# Patient Record
Sex: Female | Born: 1945
Health system: Southern US, Community
[De-identification: ages and names within clinical notes are randomized; demographics above are authoritative.]

## PROBLEM LIST (undated history)

## (undated) DIAGNOSIS — I10 Essential (primary) hypertension: Secondary | ICD-10-CM

## (undated) DIAGNOSIS — C801 Malignant (primary) neoplasm, unspecified: Secondary | ICD-10-CM

## (undated) DIAGNOSIS — E7800 Pure hypercholesterolemia, unspecified: Secondary | ICD-10-CM

## (undated) DIAGNOSIS — I493 Ventricular premature depolarization: Secondary | ICD-10-CM

## (undated) HISTORY — DX: Essential (primary) hypertension: I10

## (undated) HISTORY — PX: BUNIONECTOMY: SHX129

## (undated) HISTORY — DX: Ventricular premature depolarization: I49.3

## (undated) HISTORY — PX: BREAST ENHANCEMENT SURGERY: SHX7

## (undated) HISTORY — PX: EYE SURGERY: SHX253

## (undated) HISTORY — PX: TUBAL LIGATION: SHX77

## (undated) HISTORY — DX: Pure hypercholesterolemia, unspecified: E78.00

---

## 1969-03-31 HISTORY — PX: CERVICAL CONE BIOPSY: SUR198

## 1986-03-31 HISTORY — PX: AUGMENTATION MAMMAPLASTY: SUR837

## 2011-02-13 ENCOUNTER — Ambulatory Visit: Payer: Self-pay | Admitting: Internal Medicine

## 2011-08-14 DIAGNOSIS — L639 Alopecia areata, unspecified: Secondary | ICD-10-CM | POA: Diagnosis not present

## 2011-08-14 DIAGNOSIS — L219 Seborrheic dermatitis, unspecified: Secondary | ICD-10-CM | POA: Diagnosis not present

## 2011-08-19 DIAGNOSIS — L659 Nonscarring hair loss, unspecified: Secondary | ICD-10-CM | POA: Diagnosis not present

## 2011-08-19 DIAGNOSIS — F17200 Nicotine dependence, unspecified, uncomplicated: Secondary | ICD-10-CM | POA: Diagnosis not present

## 2011-08-19 DIAGNOSIS — Z86010 Personal history of colonic polyps: Secondary | ICD-10-CM | POA: Diagnosis not present

## 2011-08-19 DIAGNOSIS — E785 Hyperlipidemia, unspecified: Secondary | ICD-10-CM | POA: Diagnosis not present

## 2011-08-26 ENCOUNTER — Ambulatory Visit: Payer: Self-pay | Admitting: Internal Medicine

## 2011-08-26 DIAGNOSIS — Z978 Presence of other specified devices: Secondary | ICD-10-CM | POA: Diagnosis not present

## 2011-08-26 DIAGNOSIS — Z1231 Encounter for screening mammogram for malignant neoplasm of breast: Secondary | ICD-10-CM | POA: Diagnosis not present

## 2011-09-06 LAB — HM PAP SMEAR: HM Pap smear: NORMAL

## 2011-09-15 DIAGNOSIS — L639 Alopecia areata, unspecified: Secondary | ICD-10-CM | POA: Diagnosis not present

## 2011-09-16 DIAGNOSIS — Z0000 Encounter for general adult medical examination without abnormal findings: Secondary | ICD-10-CM | POA: Diagnosis not present

## 2011-09-16 DIAGNOSIS — E785 Hyperlipidemia, unspecified: Secondary | ICD-10-CM | POA: Diagnosis not present

## 2011-09-16 DIAGNOSIS — F17200 Nicotine dependence, unspecified, uncomplicated: Secondary | ICD-10-CM | POA: Diagnosis not present

## 2011-09-16 DIAGNOSIS — R87619 Unspecified abnormal cytological findings in specimens from cervix uteri: Secondary | ICD-10-CM | POA: Diagnosis not present

## 2011-09-16 DIAGNOSIS — Z79899 Other long term (current) drug therapy: Secondary | ICD-10-CM | POA: Diagnosis not present

## 2011-11-12 DIAGNOSIS — L639 Alopecia areata, unspecified: Secondary | ICD-10-CM | POA: Diagnosis not present

## 2012-01-23 DIAGNOSIS — Z23 Encounter for immunization: Secondary | ICD-10-CM | POA: Diagnosis not present

## 2012-02-12 DIAGNOSIS — L639 Alopecia areata, unspecified: Secondary | ICD-10-CM | POA: Diagnosis not present

## 2012-03-12 DIAGNOSIS — L639 Alopecia areata, unspecified: Secondary | ICD-10-CM | POA: Diagnosis not present

## 2012-06-03 DIAGNOSIS — M533 Sacrococcygeal disorders, not elsewhere classified: Secondary | ICD-10-CM | POA: Diagnosis not present

## 2012-08-09 DIAGNOSIS — J3489 Other specified disorders of nose and nasal sinuses: Secondary | ICD-10-CM | POA: Diagnosis not present

## 2012-08-09 DIAGNOSIS — R42 Dizziness and giddiness: Secondary | ICD-10-CM | POA: Diagnosis not present

## 2012-09-29 ENCOUNTER — Ambulatory Visit: Payer: Self-pay | Admitting: Internal Medicine

## 2012-09-29 DIAGNOSIS — Z1231 Encounter for screening mammogram for malignant neoplasm of breast: Secondary | ICD-10-CM | POA: Diagnosis not present

## 2012-10-04 DIAGNOSIS — H01009 Unspecified blepharitis unspecified eye, unspecified eyelid: Secondary | ICD-10-CM | POA: Diagnosis not present

## 2012-10-05 DIAGNOSIS — E785 Hyperlipidemia, unspecified: Secondary | ICD-10-CM | POA: Diagnosis not present

## 2012-10-05 DIAGNOSIS — Z86010 Personal history of colonic polyps: Secondary | ICD-10-CM | POA: Diagnosis not present

## 2012-10-05 DIAGNOSIS — Z79899 Other long term (current) drug therapy: Secondary | ICD-10-CM | POA: Diagnosis not present

## 2012-10-05 DIAGNOSIS — Z0000 Encounter for general adult medical examination without abnormal findings: Secondary | ICD-10-CM | POA: Diagnosis not present

## 2012-10-18 DIAGNOSIS — Z86010 Personal history of colonic polyps: Secondary | ICD-10-CM | POA: Diagnosis not present

## 2012-10-21 DIAGNOSIS — L259 Unspecified contact dermatitis, unspecified cause: Secondary | ICD-10-CM | POA: Diagnosis not present

## 2012-12-02 ENCOUNTER — Ambulatory Visit: Payer: Self-pay | Admitting: Gastroenterology

## 2012-12-02 DIAGNOSIS — Z860100 Personal history of colon polyps, unspecified: Secondary | ICD-10-CM | POA: Diagnosis not present

## 2012-12-02 DIAGNOSIS — E785 Hyperlipidemia, unspecified: Secondary | ICD-10-CM | POA: Diagnosis not present

## 2012-12-02 DIAGNOSIS — K648 Other hemorrhoids: Secondary | ICD-10-CM | POA: Diagnosis not present

## 2012-12-02 DIAGNOSIS — D126 Benign neoplasm of colon, unspecified: Secondary | ICD-10-CM | POA: Diagnosis not present

## 2012-12-02 DIAGNOSIS — Z801 Family history of malignant neoplasm of trachea, bronchus and lung: Secondary | ICD-10-CM | POA: Diagnosis not present

## 2012-12-02 DIAGNOSIS — M810 Age-related osteoporosis without current pathological fracture: Secondary | ICD-10-CM | POA: Diagnosis not present

## 2012-12-02 DIAGNOSIS — F17200 Nicotine dependence, unspecified, uncomplicated: Secondary | ICD-10-CM | POA: Diagnosis not present

## 2012-12-02 DIAGNOSIS — Z79899 Other long term (current) drug therapy: Secondary | ICD-10-CM | POA: Diagnosis not present

## 2012-12-02 LAB — HM COLONOSCOPY

## 2012-12-03 LAB — PATHOLOGY REPORT

## 2012-12-30 DIAGNOSIS — Z23 Encounter for immunization: Secondary | ICD-10-CM | POA: Diagnosis not present

## 2013-01-05 DIAGNOSIS — L819 Disorder of pigmentation, unspecified: Secondary | ICD-10-CM | POA: Diagnosis not present

## 2013-01-05 DIAGNOSIS — L639 Alopecia areata, unspecified: Secondary | ICD-10-CM | POA: Diagnosis not present

## 2013-01-05 DIAGNOSIS — M6580 Other synovitis and tenosynovitis, unspecified site: Secondary | ICD-10-CM | POA: Diagnosis not present

## 2013-04-19 DIAGNOSIS — H905 Unspecified sensorineural hearing loss: Secondary | ICD-10-CM | POA: Diagnosis not present

## 2013-04-19 DIAGNOSIS — H9319 Tinnitus, unspecified ear: Secondary | ICD-10-CM | POA: Diagnosis not present

## 2013-05-17 ENCOUNTER — Ambulatory Visit (INDEPENDENT_AMBULATORY_CARE_PROVIDER_SITE_OTHER): Payer: Medicare Other | Admitting: Podiatry

## 2013-05-17 ENCOUNTER — Ambulatory Visit (INDEPENDENT_AMBULATORY_CARE_PROVIDER_SITE_OTHER): Payer: Medicare Other

## 2013-05-17 ENCOUNTER — Encounter: Payer: Self-pay | Admitting: Podiatry

## 2013-05-17 VITALS — BP 117/71 | HR 67 | Resp 16 | Ht 66.5 in | Wt 168.0 lb

## 2013-05-17 DIAGNOSIS — M79609 Pain in unspecified limb: Secondary | ICD-10-CM

## 2013-05-17 DIAGNOSIS — M216X9 Other acquired deformities of unspecified foot: Secondary | ICD-10-CM | POA: Diagnosis not present

## 2013-05-17 DIAGNOSIS — L84 Corns and callosities: Secondary | ICD-10-CM | POA: Diagnosis not present

## 2013-05-17 DIAGNOSIS — M79672 Pain in left foot: Secondary | ICD-10-CM

## 2013-05-17 NOTE — Progress Notes (Signed)
Subjective:     Patient ID: Melinda Perry, female   DOB: 1946-02-25, 68 y.o.   MRN: [id number]  HPI patient presents stating I have had painful calluses on my left foot over my right foot that I have been trying to take care of myself with trimming and with shoe modifications. They do become bothersome I'm going to posterior [place] and wanted to see if there is anything else that could be done   Review of Systems  All other systems reviewed and are negative.       Objective:   Physical Exam  Nursing note and vitals reviewed. Constitutional: She is oriented to person, place, and time.  Cardiovascular: Intact distal pulses.   Musculoskeletal: Normal range of motion.  Neurological: She is oriented to person, place, and time.  Skin: Skin is warm.   neurovascular status intact no health history changes noted at this time with muscle strength adequate mild equinus and some diminishment of the plantar fat pad on the metatarsals of both feet. Keratotic lesion sub-1-5 left sub-5 right     Assessment:     Plantarflexed metatarsals with history of foot surgery and diminished fat pad noted left over right foot    Plan:     H&P and x-rays reviewed. Today debridement accomplished with discussion of customized orthotics if symptoms were to persist reappoint if this does not solve her problem

## 2013-05-17 NOTE — Progress Notes (Signed)
   Subjective:    Patient ID: Melinda Perry, female    DOB: 1945-12-10, 68 y.o.   MRN: [id number]  HPI Pt states to have the calluses on the left foot looked at , have had it for several years , left foot 1st met and 5th met head painful. Pt states that she trims them herself    Review of Systems  All other systems reviewed and are negative.       Objective:   Physical Exam        Assessment & Plan:

## 2013-10-10 DIAGNOSIS — R030 Elevated blood-pressure reading, without diagnosis of hypertension: Secondary | ICD-10-CM | POA: Diagnosis not present

## 2013-10-29 LAB — HM MAMMOGRAPHY: HM Mammogram: NORMAL

## 2013-11-01 ENCOUNTER — Ambulatory Visit: Payer: Self-pay | Admitting: Internal Medicine

## 2013-11-01 DIAGNOSIS — Z1231 Encounter for screening mammogram for malignant neoplasm of breast: Secondary | ICD-10-CM | POA: Diagnosis not present

## 2013-11-16 DIAGNOSIS — E785 Hyperlipidemia, unspecified: Secondary | ICD-10-CM | POA: Diagnosis not present

## 2013-11-16 DIAGNOSIS — Z79899 Other long term (current) drug therapy: Secondary | ICD-10-CM | POA: Diagnosis not present

## 2013-11-16 DIAGNOSIS — Z23 Encounter for immunization: Secondary | ICD-10-CM | POA: Diagnosis not present

## 2013-11-16 DIAGNOSIS — Z0000 Encounter for general adult medical examination without abnormal findings: Secondary | ICD-10-CM | POA: Diagnosis not present

## 2013-11-16 DIAGNOSIS — R030 Elevated blood-pressure reading, without diagnosis of hypertension: Secondary | ICD-10-CM | POA: Diagnosis not present

## 2013-12-28 DIAGNOSIS — R3129 Other microscopic hematuria: Secondary | ICD-10-CM | POA: Diagnosis not present

## 2013-12-28 DIAGNOSIS — I10 Essential (primary) hypertension: Secondary | ICD-10-CM | POA: Diagnosis not present

## 2013-12-28 DIAGNOSIS — E785 Hyperlipidemia, unspecified: Secondary | ICD-10-CM | POA: Diagnosis not present

## 2013-12-28 DIAGNOSIS — Z23 Encounter for immunization: Secondary | ICD-10-CM | POA: Diagnosis not present

## 2013-12-28 LAB — BASIC METABOLIC PANEL
BUN: 17 mg/dL (ref 4–21)
Creatinine: 0.8 mg/dL (ref 0.5–1.1)

## 2013-12-28 LAB — TSH: TSH: 2.4 u[IU]/mL (ref 0.41–5.90)

## 2013-12-28 LAB — LIPID PANEL
CHOLESTEROL: 205 mg/dL — AB (ref 0–200)
HDL: 48 mg/dL (ref 35–70)
LDL CALC: 138 mg/dL
Triglycerides: 97 mg/dL (ref 40–160)

## 2013-12-28 LAB — CBC AND DIFFERENTIAL: Hemoglobin: 15.9 g/dL (ref 12.0–16.0)

## 2014-02-08 ENCOUNTER — Ambulatory Visit: Payer: Self-pay | Admitting: Physician Assistant

## 2014-02-08 DIAGNOSIS — W2189XA Striking against or struck by other sports equipment, initial encounter: Secondary | ICD-10-CM | POA: Diagnosis not present

## 2014-02-08 DIAGNOSIS — I10 Essential (primary) hypertension: Secondary | ICD-10-CM | POA: Diagnosis not present

## 2014-02-08 DIAGNOSIS — S61211A Laceration without foreign body of left index finger without damage to nail, initial encounter: Secondary | ICD-10-CM | POA: Diagnosis not present

## 2014-02-18 ENCOUNTER — Ambulatory Visit: Payer: Self-pay | Admitting: Physician Assistant

## 2014-02-18 DIAGNOSIS — Z043 Encounter for examination and observation following other accident: Secondary | ICD-10-CM | POA: Diagnosis not present

## 2014-02-18 DIAGNOSIS — I10 Essential (primary) hypertension: Secondary | ICD-10-CM | POA: Diagnosis not present

## 2014-05-04 DIAGNOSIS — H11153 Pinguecula, bilateral: Secondary | ICD-10-CM | POA: Diagnosis not present

## 2014-05-25 DIAGNOSIS — Z961 Presence of intraocular lens: Secondary | ICD-10-CM | POA: Diagnosis not present

## 2014-06-12 DIAGNOSIS — L638 Other alopecia areata: Secondary | ICD-10-CM | POA: Diagnosis not present

## 2014-06-12 DIAGNOSIS — D229 Melanocytic nevi, unspecified: Secondary | ICD-10-CM | POA: Diagnosis not present

## 2014-11-27 DIAGNOSIS — B351 Tinea unguium: Secondary | ICD-10-CM | POA: Diagnosis not present

## 2014-11-30 DIAGNOSIS — L638 Other alopecia areata: Secondary | ICD-10-CM | POA: Diagnosis not present

## 2014-12-27 ENCOUNTER — Other Ambulatory Visit: Payer: Self-pay | Admitting: Internal Medicine

## 2014-12-29 DIAGNOSIS — Z23 Encounter for immunization: Secondary | ICD-10-CM | POA: Diagnosis not present

## 2014-12-31 IMAGING — MG MM DIGITAL SCREENING BILAT W/ CAD
1 series · 8 of 8 positions shown · non-contrast
Comparison: none

REASON FOR EXAM: SCR MAMMO NO ORDER
COMMENTS:

[R CC · right · 8 of 10 slices shown]
[im 1/10]
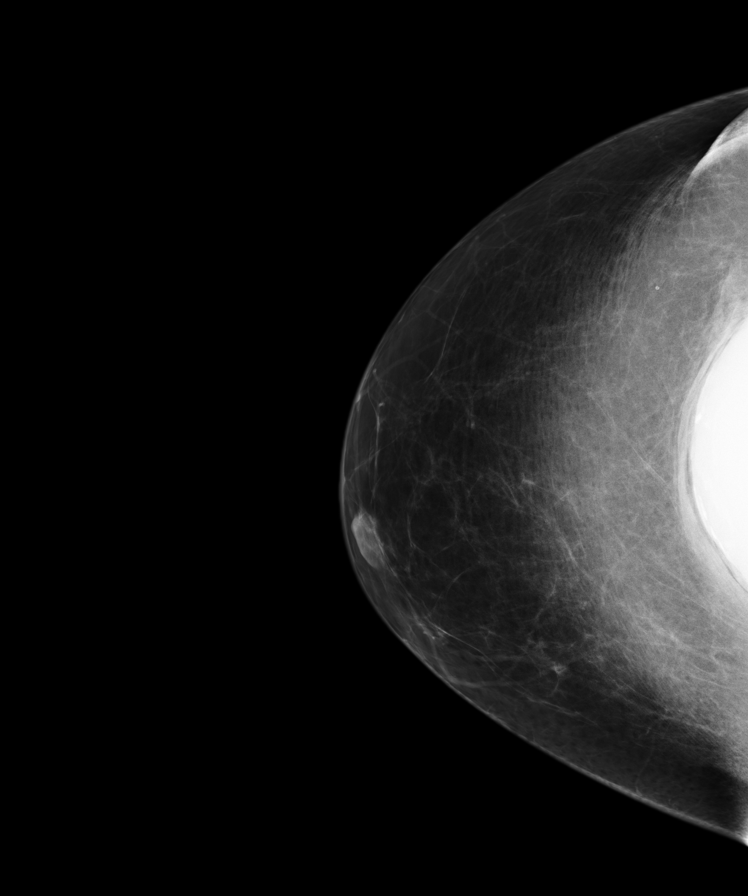
[im 2/10]
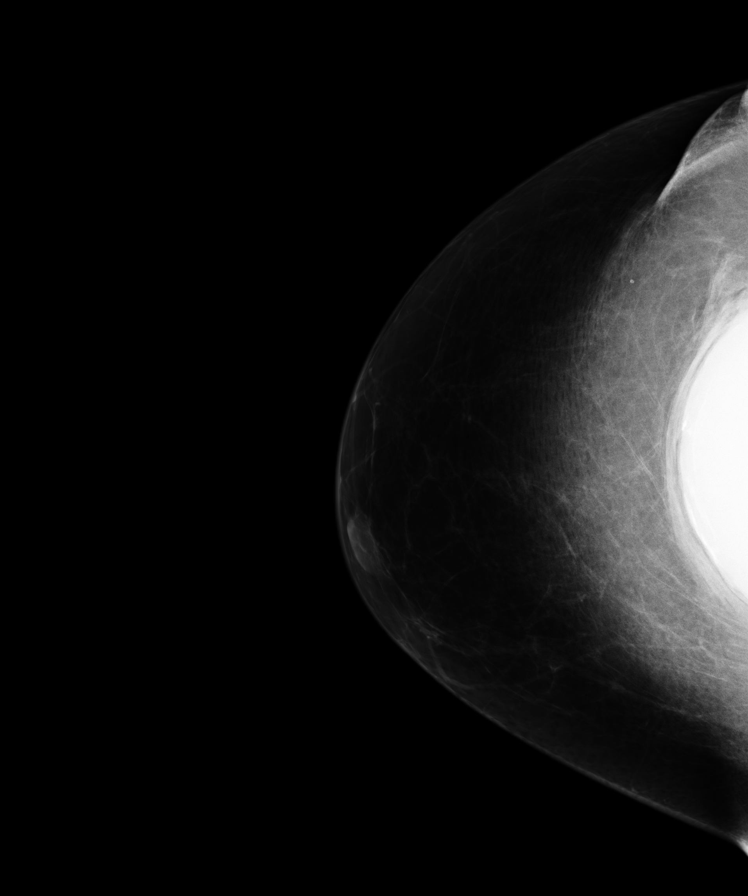
[im 3/10]
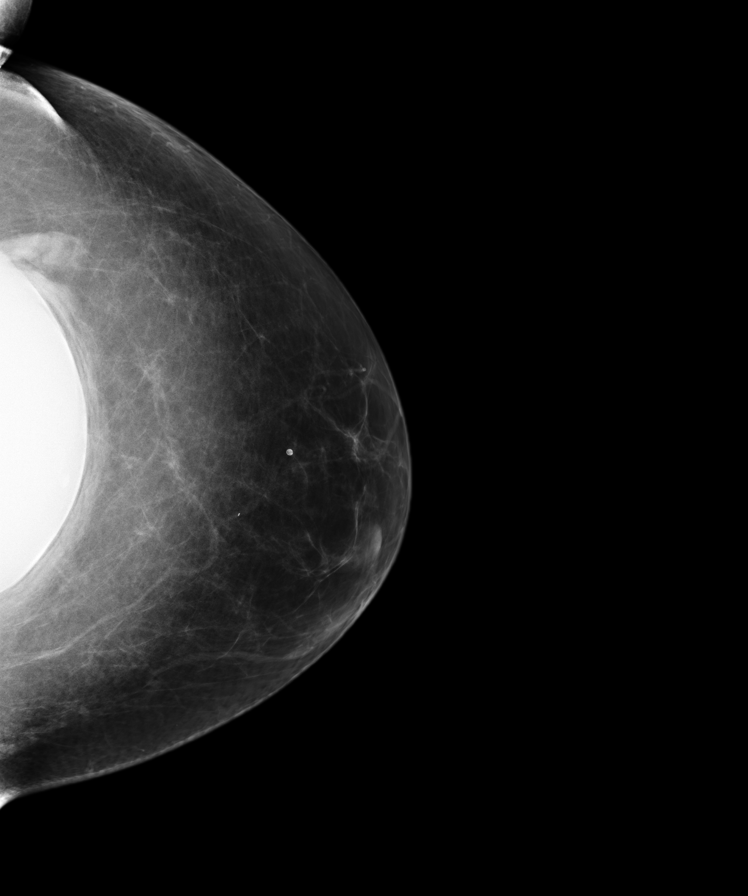
[im 4/10]
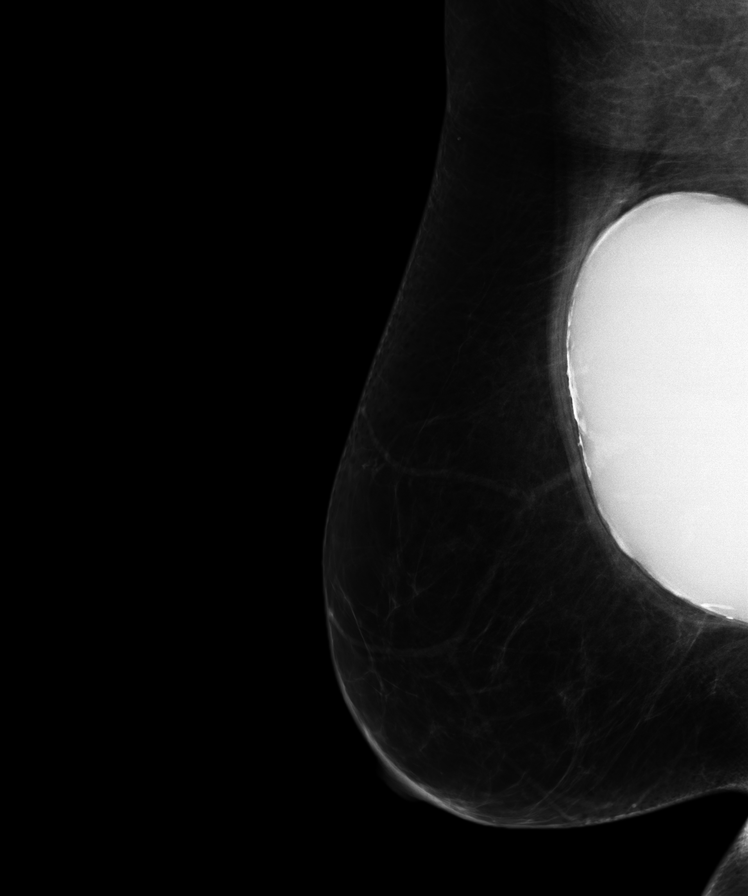
[im 6/10]
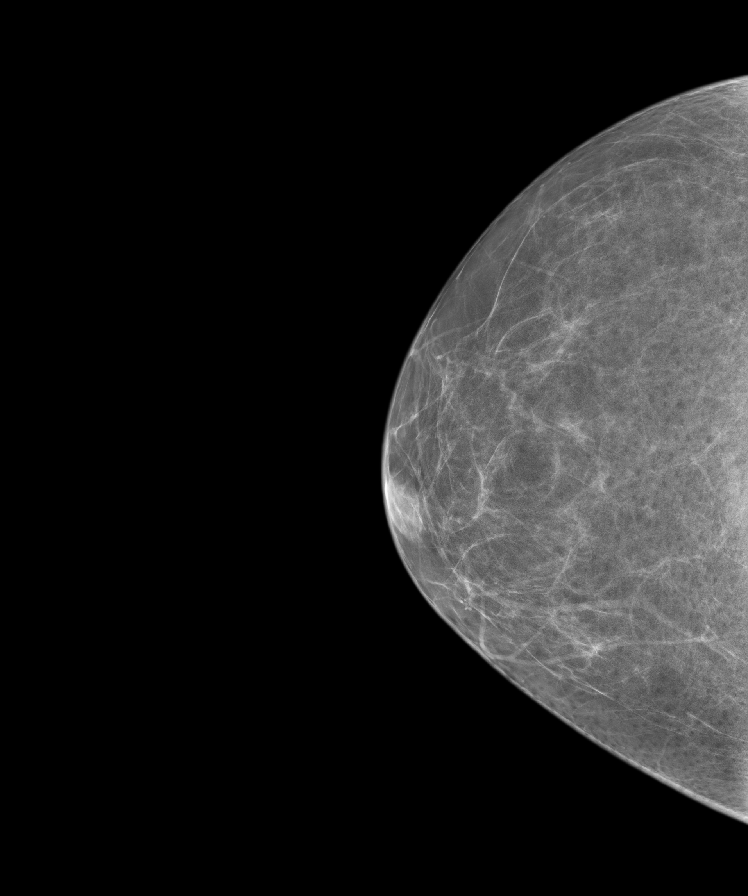
[im 7/10]
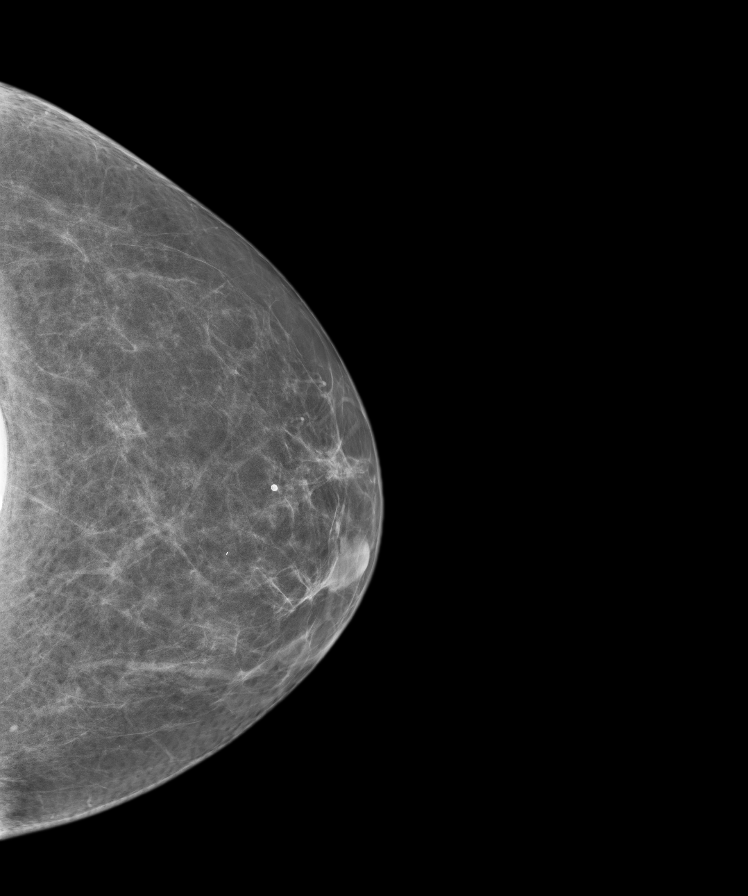
[im 8/10]
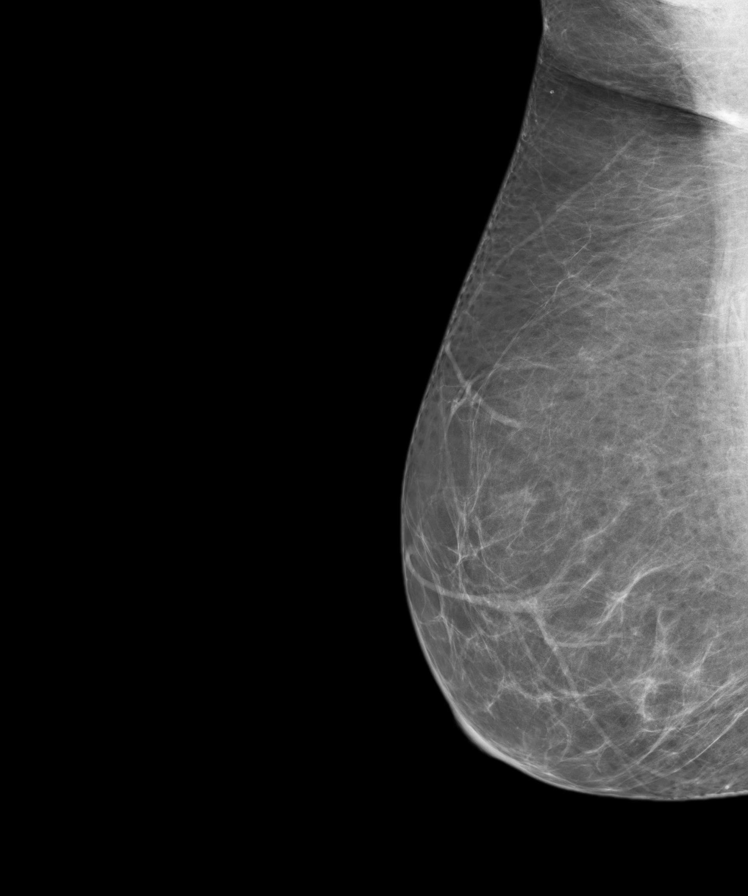
[im 10/10]
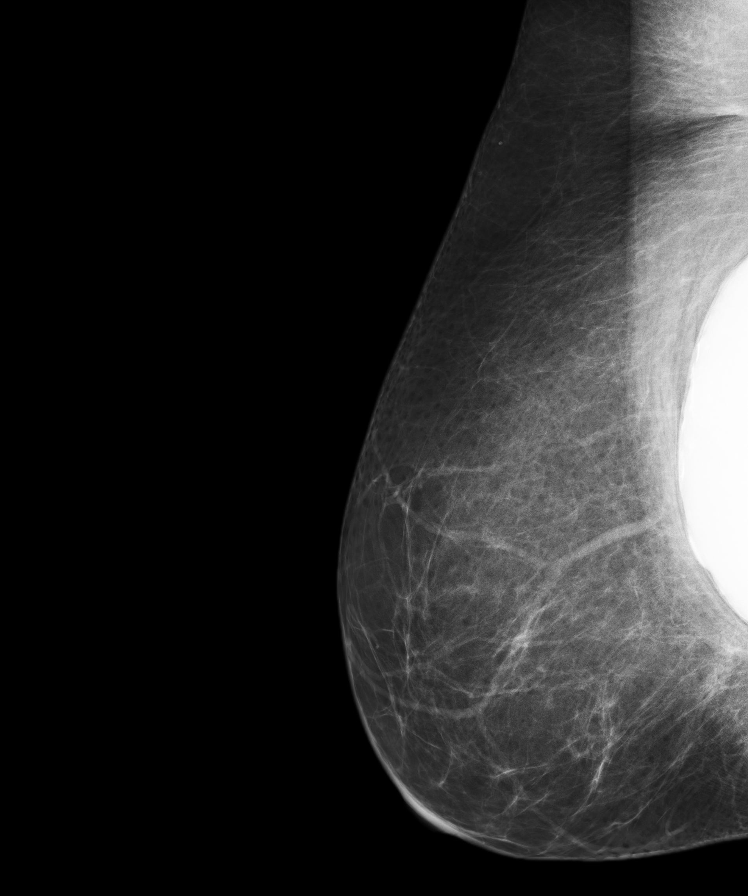

[8 of 8 positions shown; findings below may reference images not displayed]

PROCEDURE:     MMM - MMM DGT SCR NO ORDER W/CAD  - September 29, 2012  [DATE]

RESULT:     Standard and pushback views were obtained through both breasts
to allow imaging of the native parenchyma as well as the breast implants.
Comparison is made to previous digital studies August 26, 2011,June 11, 2010, and June 02, 2007.

The breast implants appear smoothly marginated. There are small amounts of
capsular calcification demonstrated predominantly on the right. The native
breast parenchyma is largely fatty replaced. There is no dominant mass and
there are no malignant appearing groupings of microcalcification.
IMPRESSION: There are no findings suspicious for malignancy nor for
implant rupture.

BI-RADS 2: Benign findings.

Recommendation: please continue to encourage yearly mammographic followup.

BREAST COMPOSITION: The breast composition is ALMOST ENTIRELY FATTY
(glandular tissue is less than 25%)

A NEGATIVE MAMMOGRAM REPORT DOES NOT PRECLUDE BIOPSY OR OTHER EVALUATION OF
A CLINICALLY PALPABLE OR OTHERWISE SUSPICIOUS MASS OR LESION. BREAST CANCER
MAY NOT BE DETECTED BY MAMMOGRAPHY IN UP TO 10% OF CASES.

[REDACTED]

## 2015-01-02 DIAGNOSIS — L638 Other alopecia areata: Secondary | ICD-10-CM | POA: Diagnosis not present

## 2015-03-09 ENCOUNTER — Other Ambulatory Visit: Payer: Self-pay | Admitting: Internal Medicine

## 2015-03-09 ENCOUNTER — Encounter: Payer: Self-pay | Admitting: Internal Medicine

## 2015-03-09 DIAGNOSIS — L659 Nonscarring hair loss, unspecified: Secondary | ICD-10-CM | POA: Insufficient documentation

## 2015-03-09 DIAGNOSIS — R87619 Unspecified abnormal cytological findings in specimens from cervix uteri: Secondary | ICD-10-CM | POA: Insufficient documentation

## 2015-03-09 DIAGNOSIS — F17201 Nicotine dependence, unspecified, in remission: Secondary | ICD-10-CM | POA: Insufficient documentation

## 2015-03-09 DIAGNOSIS — E782 Mixed hyperlipidemia: Secondary | ICD-10-CM | POA: Insufficient documentation

## 2015-03-09 DIAGNOSIS — R3129 Other microscopic hematuria: Secondary | ICD-10-CM | POA: Insufficient documentation

## 2015-03-09 DIAGNOSIS — Z86010 Personal history of colonic polyps: Secondary | ICD-10-CM | POA: Insufficient documentation

## 2015-03-09 DIAGNOSIS — Z8781 Personal history of (healed) traumatic fracture: Secondary | ICD-10-CM | POA: Insufficient documentation

## 2015-03-09 DIAGNOSIS — I10 Essential (primary) hypertension: Secondary | ICD-10-CM | POA: Insufficient documentation

## 2015-04-11 ENCOUNTER — Encounter: Payer: Self-pay | Admitting: Internal Medicine

## 2015-04-11 ENCOUNTER — Ambulatory Visit
Admission: RE | Admit: 2015-04-11 | Discharge: 2015-04-11 | Disposition: A | Discharge status: Home / Self Care | Payer: Medicare Other | Source: Ambulatory Visit | Attending: Internal Medicine | Admitting: Internal Medicine

## 2015-04-11 ENCOUNTER — Ambulatory Visit (INDEPENDENT_AMBULATORY_CARE_PROVIDER_SITE_OTHER): Payer: Medicare Other | Admitting: Internal Medicine

## 2015-04-11 ENCOUNTER — Other Ambulatory Visit: Payer: Self-pay | Admitting: Internal Medicine

## 2015-04-11 VITALS — BP 108/70 | HR 68 | Ht 66.0 in | Wt 176.2 lb

## 2015-04-11 DIAGNOSIS — M25579 Pain in unspecified ankle and joints of unspecified foot: Secondary | ICD-10-CM | POA: Insufficient documentation

## 2015-04-11 DIAGNOSIS — F17200 Nicotine dependence, unspecified, uncomplicated: Secondary | ICD-10-CM | POA: Diagnosis not present

## 2015-04-11 DIAGNOSIS — I10 Essential (primary) hypertension: Secondary | ICD-10-CM | POA: Diagnosis not present

## 2015-04-11 DIAGNOSIS — Z1231 Encounter for screening mammogram for malignant neoplasm of breast: Secondary | ICD-10-CM

## 2015-04-11 DIAGNOSIS — Z1239 Encounter for other screening for malignant neoplasm of breast: Secondary | ICD-10-CM

## 2015-04-11 DIAGNOSIS — M25572 Pain in left ankle and joints of left foot: Secondary | ICD-10-CM

## 2015-04-11 DIAGNOSIS — E784 Other hyperlipidemia: Secondary | ICD-10-CM | POA: Diagnosis not present

## 2015-04-11 DIAGNOSIS — L853 Xerosis cutis: Secondary | ICD-10-CM

## 2015-04-11 DIAGNOSIS — E7849 Other hyperlipidemia: Secondary | ICD-10-CM

## 2015-04-11 HISTORY — DX: Pain in unspecified ankle and joints of unspecified foot: M25.579

## 2015-04-11 MED ORDER — HYDROCORTISONE 0.5 % EX CREA: 1.0000 "application " | TOPICAL_CREAM | Freq: Two times a day (BID) | CUTANEOUS | Status: DC

## 2015-04-11 MED ORDER — LOSARTAN POTASSIUM 25 MG PO TABS: 25.0000 mg | ORAL_TABLET | Freq: Every day | ORAL | Status: DC

## 2015-04-11 MED ORDER — ATORVASTATIN CALCIUM 10 MG PO TABS: 10.0000 mg | ORAL_TABLET | Freq: Every day | ORAL | Status: DC

## 2015-04-11 NOTE — Progress Notes (Signed)
Patient: Melinda Perry, Female    DOB: [birth year], 71 y.o.   MRN: [id number] Visit Date: 04/11/2015  Today's Provider: Halina Maidens, MD   Chief Complaint  Patient presents with  . Medicare Wellness  . Hypertension  . Hyperlipidemia   Subjective:    Annual wellness visit Melinda Perry is a 70 y.o. female who presents today for her Subsequent Annual Wellness Visit. She feels fairly well. She reports exercising by bowling and going to the gym. She reports she is sleeping fairly well. She resumed smoking cigarettes recently after being quit for one year. She is due for mammogram.  ----------------------------------------------------------- Ankle Pain  There was no injury mechanism. The pain is present in the left ankle. The pain has been intermittent since onset.  Hypertension This is a chronic problem. The current episode started more than 1 year ago. The problem is unchanged. The problem is controlled. Pertinent negatives include no chest pain, headaches, palpitations or shortness of breath.  Hyperlipidemia This is a chronic problem. The problem is controlled. Recent lipid tests were reviewed and are normal. Pertinent negatives include no chest pain or shortness of breath. Current antihyperlipidemic treatment includes statins. The current treatment provides significant improvement of lipids.  Dry skin - this is a problem in the wintertime with the low humidity. She has itching and dry skin on her forehead which is not responded to lotions.  Ankle discomfort - she complains of right ankle discomfort especially with flexion of the ankle when she bowls. It also bothers her going downstairs. She is not in favor of taking a lot of medication but wonders what can be done.  Review of Systems  Constitutional: Negative for fever, chills, diaphoresis, fatigue and unexpected weight change.  HENT: Negative for hearing loss, sneezing, tinnitus, trouble swallowing and voice change.     Eyes: Negative for visual disturbance.  Respiratory: Positive for wheezing. Negative for cough, chest tightness and shortness of breath.   Cardiovascular: Negative for chest pain, palpitations and leg swelling.  Gastrointestinal: Negative for abdominal pain, diarrhea and constipation.  Genitourinary: Negative for dysuria, vaginal bleeding, vaginal discharge and pelvic pain.  Musculoskeletal: Positive for arthralgias. Negative for back pain.  Skin: Positive for rash.  Neurological: Negative for tremors, light-headedness and headaches.  Hematological: Negative for adenopathy. Does not bruise/bleed easily.  Psychiatric/Behavioral: Negative for suicidal ideas, sleep disturbance and dysphoric mood.    Social History   Social History  . Marital Status: Divorced    Spouse Name: N/A  . Number of Children: N/A  . Years of Education: N/A   Occupational History  . Not on file.   Social History Main Topics  . Smoking status: Current Every Day Smoker -- 0.50 packs/day for 55 years    Types: Cigarettes  . Smokeless tobacco: Never Used  . Alcohol Use: 1.2 oz/week    2 Standard drinks or equivalent per week     Comment: occasionally  . Drug Use: No  . Sexual Activity: Not on file   Other Topics Concern  . Not on file   Social History Narrative    Patient Active Problem List   Diagnosis Date Noted  . Ankle pain 04/11/2015  . Xerosis cutis 04/11/2015  . Familial multiple lipoprotein-type hyperlipidemia 03/09/2015  . Compulsive tobacco user syndrome 03/09/2015  . Wedging of vertebra ([place]) 03/09/2015  . Abnormal glandular Papanicolaou smear of cervix 03/09/2015  . Alopecia 03/09/2015  . History of colon polyps 03/09/2015  . Benign hypertension 03/09/2015  . Microscopic  hematuria 03/09/2015    Past Surgical History  Procedure Laterality Date  . Bunionectomy Bilateral   . Cervical cone biopsy  1971    LSIL  . Breast enhancement surgery    . Tubal ligation    . Augmentation  mammaplasty Bilateral 1988    Her family history includes Lung cancer in her mother; Pulmonary fibrosis in her father.    Previous Medications   CICLOPIROX (PENLAC) 8 % SOLUTION    APP AA ON AND AROUND NAIL ATN   CLOBETASOL (TEMOVATE) 0.05 % EXTERNAL SOLUTION        Patient Care Team: Glean Hess, MD as PCP - General (Family Medicine)     Objective:   Vitals: BP 108/70 mmHg  Pulse 68  Ht 5\' 6"  (1.676 m)  Wt 176 lb 3.2 oz (79.924 kg)  BMI 28.45 kg/m2  Physical Exam  Constitutional: She is oriented to person, place, and time. She appears well-developed and well-nourished. No distress.  HENT:  Head: Normocephalic and atraumatic.  Right Ear: Tympanic membrane and ear canal normal.  Left Ear: Tympanic membrane and ear canal normal.  Nose: Right sinus exhibits no maxillary sinus tenderness. Left sinus exhibits no maxillary sinus tenderness.  Mouth/Throat: Uvula is midline and oropharynx is clear and moist.  Eyes: Conjunctivae and EOM are normal. Right eye exhibits no discharge. Left eye exhibits no discharge. No scleral icterus.  Neck: Normal range of motion. Carotid bruit is not present. No erythema present. No thyromegaly present.  Cardiovascular: Normal rate, regular rhythm, normal heart sounds and normal pulses.   Pulmonary/Chest: Effort normal. No respiratory distress. She has no wheezes. Right breast exhibits no mass, no nipple discharge, no skin change and no tenderness. Left breast exhibits no mass, no nipple discharge, no skin change and no tenderness.  Bilateral breast implants soft and mobile bilaterally  Abdominal: Soft. Bowel sounds are normal. There is no hepatosplenomegaly. There is no tenderness. There is no CVA tenderness.  Genitourinary: Rectum normal and uterus normal. There is no rash or tenderness on the right labia. There is no rash or tenderness on the left labia. Cervix exhibits no motion tenderness. Right adnexum displays no mass, no tenderness and no  fullness. Left adnexum displays no mass, no tenderness and no fullness.  Musculoskeletal:       Left hip: She exhibits tenderness (along lateral hip bursa).       Left ankle: She exhibits decreased range of motion. She exhibits no swelling, no deformity and normal pulse.  Lymphadenopathy:    She has no cervical adenopathy.    She has no axillary adenopathy.  Neurological: She is alert and oriented to person, place, and time. She has normal reflexes. No cranial nerve deficit or sensory deficit.  Skin: Skin is warm, dry and intact. No rash noted.     Psychiatric: She has a normal mood and affect. Her speech is normal and behavior is normal. Thought content normal.  Nursing note and vitals reviewed.   Activities of Daily Living In your present state of health, do you have any difficulty performing the following activities: 04/11/2015  Hearing? N  Vision? N  Difficulty concentrating or making decisions? N  Walking or climbing stairs? N  Dressing or bathing? N  Doing errands, shopping? N    Fall Risk Assessment Fall Risk  04/11/2015  Falls in the past year? No      Depression Screen PHQ 2/9 Scores 04/11/2015 04/11/2015  PHQ - 2 Score 0 3  PHQ- 9  Score - 3    Cognitive Testing - 6-CIT   Correct? Score   What year is it? yes 0 Yes = 0    No = 4  What month is it? yes 0 Yes = 0    No = 3  Remember:     [address], [place], [place]     What time is it? yes 0 Yes = 0    No = 3  Count backwards from 20 to 1 yes 0 Correct = 0    1 error = 2   More than 1 error = 4  Say the months of the year in reverse. yes 0 Correct = 0    1 error = 2   More than 1 error = 4  What address did I ask you to remember? yes 0 Correct = 0  1 error = 2    2 error = 4    3 error = 6    4 error = 8    All wrong = 10       TOTAL SCORE  0/28   Interpretation:  Normal  Normal (0-7) Abnormal (8-28)        Medicare Annual Wellness Visit Summary:  Reviewed patient's Family Medical  History Reviewed and updated list of patient's medical providers Assessment of cognitive impairment was done Assessed patient's functional ability Established a written schedule for health screening [place] Completed and Reviewed  Exercise Activities and Dietary recommendations Goals    . Weight < 200 lb (90.719 kg)       Immunization History  Administered Date(s) Administered  . Influenza-Unspecified 12/30/2014  . Pneumococcal Conjugate-13 11/16/2013  . Pneumococcal Polysaccharide-23 04/01/2010  . Tdap 04/01/2010  . Zoster 04/01/2010    Health Maintenance  Topic Date Due  . INFLUENZA VACCINE  10/30/2015  . MAMMOGRAM  11/02/2015  . TETANUS/TDAP  04/01/2020  . COLONOSCOPY  12/03/2022  . DEXA SCAN  Addressed  . ZOSTAVAX  Completed  . Hepatitis C Screening  Addressed  . PNA vac Low Risk Adult  Completed     Discussed health benefits of physical activity, and encouraged her to engage in regular exercise appropriate for her age and condition.    ------------------------------------------------------------------------------------------------------------   Assessment & Plan:   1. Benign hypertension controlled - losartan (COZAAR) 25 MG tablet; Take 1 tablet (25 mg total) by mouth daily.  Dispense: 90 tablet; Refill: 3 - CBC with Differential/Platelet - Comprehensive metabolic panel - TSH  2. Familial multiple lipoprotein-type hyperlipidemia Doing well on Lipitor - atorvastatin (LIPITOR) 10 MG tablet; Take 1 tablet (10 mg total) by mouth daily.  Dispense: 90 tablet; Refill: 3 - Lipid panel  3. Compulsive tobacco user syndrome Options for cessation discussed Patient is not too motivated at this time  4. Xerosis cutis Will try low-dose cortisone If no improvement will recommend dermatology follow-up - hydrocortisone cream 0.5 %; Apply 1 application topically 2 (two) times daily. Apply twice a day to dermatitis -  Dispense: 30 g; Refill: 0  5.  Breast cancer screening - MM DIGITAL SCREENING BILATERAL; Future  6. Ankle pain, left Begin Tylenol 2 tablets twice a day as needed   Halina Maidens, MD [facility]  04/11/2015

## 2015-04-11 NOTE — Patient Instructions (Addendum)
Health Maintenance  Topic Date Due  . Hepatitis C Screening  completed  . INFLUENZA VACCINE  10/30/2015  . MAMMOGRAM  11/02/2014  . TETANUS/TDAP  04/01/2020  . COLONOSCOPY  12/03/2022  . DEXA SCAN  Addressed  . ZOSTAVAX  Completed  . PNA vac Low Risk Adult  Completed

## 2015-04-12 ENCOUNTER — Telehealth: Payer: Self-pay

## 2015-04-12 LAB — CBC WITH DIFFERENTIAL/PLATELET
Basophils Absolute: 0 10*3/uL (ref 0.0–0.2)
Basos: 0 %
EOS (ABSOLUTE): 0.1 10*3/uL (ref 0.0–0.4)
EOS: 1 %
HEMATOCRIT: 46.2 % (ref 34.0–46.6)
HEMOGLOBIN: 15.6 g/dL (ref 11.1–15.9)
Immature Grans (Abs): 0 10*3/uL (ref 0.0–0.1)
Immature Granulocytes: 0 %
LYMPHS ABS: 2.2 10*3/uL (ref 0.7–3.1)
Lymphs: 30 %
MCH: 30.5 pg (ref 26.6–33.0)
MCHC: 33.8 g/dL (ref 31.5–35.7)
MCV: 90 fL (ref 79–97)
MONOS ABS: 0.6 10*3/uL (ref 0.1–0.9)
Monocytes: 8 %
NEUTROS ABS: 4.5 10*3/uL (ref 1.4–7.0)
Neutrophils: 61 %
Platelets: 209 10*3/uL (ref 150–379)
RBC: 5.12 x10E6/uL (ref 3.77–5.28)
RDW: 13.9 % (ref 12.3–15.4)
WBC: 7.5 10*3/uL (ref 3.4–10.8)

## 2015-04-12 LAB — COMPREHENSIVE METABOLIC PANEL
A/G RATIO: 1.6 (ref 1.1–2.5)
ALBUMIN: 4.5 g/dL (ref 3.5–4.8)
ALK PHOS: 80 IU/L (ref 39–117)
ALT: 17 IU/L (ref 0–32)
AST: 15 IU/L (ref 0–40)
BILIRUBIN TOTAL: 0.6 mg/dL (ref 0.0–1.2)
BUN / CREAT RATIO: 12 (ref 11–26)
BUN: 10 mg/dL (ref 8–27)
CO2: 22 mmol/L (ref 18–29)
Calcium: 9.8 mg/dL (ref 8.7–10.3)
Chloride: 102 mmol/L (ref 96–106)
Creatinine, Ser: 0.86 mg/dL (ref 0.57–1.00)
GFR calc Af Amer: 79 mL/min/{1.73_m2} (ref >59)
GFR calc non Af Amer: 69 mL/min/{1.73_m2} (ref >59)
GLOBULIN, TOTAL: 2.8 g/dL (ref 1.5–4.5)
Glucose: 98 mg/dL (ref 65–99)
POTASSIUM: 4.7 mmol/L (ref 3.5–5.2)
SODIUM: 140 mmol/L (ref 134–144)
Total Protein: 7.3 g/dL (ref 6.0–8.5)

## 2015-04-12 LAB — LIPID PANEL
CHOLESTEROL TOTAL: 144 mg/dL (ref 100–199)
Chol/HDL Ratio: 3 ratio units (ref 0.0–4.4)
HDL: 48 mg/dL (ref >39)
LDL CALC: 75 mg/dL (ref 0–99)
TRIGLYCERIDES: 105 mg/dL (ref 0–149)
VLDL Cholesterol Cal: 21 mg/dL (ref 5–40)

## 2015-04-12 LAB — TSH: TSH: 2.87 u[IU]/mL (ref 0.450–4.500)

## 2015-04-12 NOTE — Telephone Encounter (Signed)
Spoke with pt. Pt. Advised of results and verbalized understanding. She states that she will sign up for the portal through MyChart to print off labs.

## 2015-04-12 NOTE — Telephone Encounter (Signed)
Left message for patient to call back. [facility]

## 2015-04-12 NOTE — Telephone Encounter (Signed)
-----   Message from Glean Hess, MD sent at 04/12/2015  7:56 AM EST ----- Labs are all normal.  Cholesterol is great. Continue same medication.

## 2015-04-27 DIAGNOSIS — H0289 Other specified disorders of eyelid: Secondary | ICD-10-CM | POA: Diagnosis not present

## 2015-05-10 ENCOUNTER — Ambulatory Visit (INDEPENDENT_AMBULATORY_CARE_PROVIDER_SITE_OTHER): Payer: Medicare Other | Admitting: Internal Medicine

## 2015-05-10 ENCOUNTER — Encounter: Payer: Self-pay | Admitting: Internal Medicine

## 2015-05-10 VITALS — BP 126/72 | HR 64 | Temp 97.9°F | Ht 66.0 in | Wt 171.0 lb

## 2015-05-10 DIAGNOSIS — J40 Bronchitis, not specified as acute or chronic: Secondary | ICD-10-CM

## 2015-05-10 MED ORDER — LEVOFLOXACIN 500 MG PO TABS: 500.0000 mg | ORAL_TABLET | Freq: Every day | ORAL | Status: DC

## 2015-05-10 MED ORDER — HYDROCODONE-HOMATROPINE 5-1.5 MG/5ML PO SYRP: 5.0000 mL | ORAL_SOLUTION | Freq: Four times a day (QID) | ORAL | Status: DC | PRN

## 2015-05-10 NOTE — Progress Notes (Signed)
Date:  05/10/2015   Name:  Melinda Perry [facility]   DOB:  01-14-1946   MRN:  [id number]   Chief Complaint: Cough Cough This is a new problem. The current episode started 1 to 4 weeks ago. The problem has been unchanged (started after a plane ride from [place] - son who was with her is sick as well). The problem occurs every few minutes. The cough is non-productive. Associated symptoms include headaches, nasal congestion, postnasal drip and rhinorrhea. Pertinent negatives include no chest pain, chills, ear congestion, ear pain, fever, shortness of breath, weight loss or wheezing. The symptoms are aggravated by lying down and exercise. Risk factors for lung disease include occupational exposure. She has tried OTC cough suppressant (flonase and mucinex) for the symptoms. The treatment provided mild relief.    Review of Systems  Constitutional: Positive for fatigue. Negative for fever, chills and weight loss.  HENT: Positive for postnasal drip and rhinorrhea. Negative for ear pain, tinnitus and trouble swallowing.   Respiratory: Positive for cough. Negative for chest tightness, shortness of breath and wheezing.   Cardiovascular: Negative for chest pain and palpitations.  Gastrointestinal: Negative for vomiting and diarrhea.  Neurological: Positive for headaches. Negative for dizziness, syncope and numbness.    Patient Active Problem List   Diagnosis Date Noted  . Ankle pain 04/11/2015  . Xerosis cutis 04/11/2015  . Familial multiple lipoprotein-type hyperlipidemia 03/09/2015  . Compulsive tobacco user syndrome 03/09/2015  . Wedging of vertebra ([place]) 03/09/2015  . Abnormal glandular Papanicolaou smear of cervix 03/09/2015  . Alopecia 03/09/2015  . History of colon polyps 03/09/2015  . Benign hypertension 03/09/2015  . Microscopic hematuria 03/09/2015    Prior to Admission medications   Medication Sig Start Date End Date Taking? Authorizing Provider  atorvastatin (LIPITOR) 10 MG tablet  Take 1 tablet (10 mg total) by mouth daily. 04/11/15  Yes Glean Hess, MD  ciclopirox (PENLAC) 8 % solution APP AA ON AND AROUND NAIL ATN 02/15/15  Yes Historical Provider, MD  clobetasol (TEMOVATE) 0.05 % external solution  04/04/13  Yes Historical Provider, MD  hydrocortisone cream 0.5 % Apply 1 application topically 2 (two) times daily. Apply twice a day to dermatitis - 04/11/15  Yes Glean Hess, MD  losartan (COZAAR) 25 MG tablet Take 1 tablet (25 mg total) by mouth daily. 04/11/15  Yes Glean Hess, MD    No Known Allergies  Past Surgical History  Procedure Laterality Date  . Bunionectomy Bilateral   . Cervical cone biopsy  1971    LSIL  . Breast enhancement surgery    . Tubal ligation    . Augmentation mammaplasty Bilateral 1988    Social History  Substance Use Topics  . Smoking status: Current Every Day Smoker -- 0.50 packs/day for 55 years    Types: Cigarettes  . Smokeless tobacco: Never Used  . Alcohol Use: 1.2 oz/week    2 Standard drinks or equivalent per week     Comment: occasionally     Medication list has been reviewed and updated.   Physical Exam  Constitutional: She appears well-developed and well-nourished.  HENT:  Right Ear: Ear canal normal. Tympanic membrane is retracted. Tympanic membrane is not erythematous.  Left Ear: Ear canal normal. Tympanic membrane is retracted. Tympanic membrane is not erythematous.  Mouth/Throat: Posterior oropharyngeal erythema present. No oropharyngeal exudate or posterior oropharyngeal edema.  Neck: Normal range of motion. Neck supple. No thyromegaly present.  Cardiovascular: Normal rate, regular rhythm and normal  heart sounds.   Pulmonary/Chest: Effort normal. No respiratory distress. She has decreased breath sounds. She has no wheezes. She has no rales.  Lymphadenopathy:    She has no cervical adenopathy.  Psychiatric: She has a normal mood and affect.  Nursing note and vitals reviewed.   BP 126/72 mmHg   Pulse 64  Temp(Src) 97.9 F (36.6 C) (Oral)  Ht 5\' 6"  (1.676 m)  Wt 171 lb (77.565 kg)  BMI 27.61 kg/m2  SpO2 98%  Assessment and Plan: 1. Bronchitis Continue Flonase NS; add claritin or allegra once a day Increase fluids and rest - HYDROcodone-homatropine (HYCODAN) 5-1.5 MG/5ML syrup; Take 5 mLs by mouth every 6 (six) hours as needed for cough.  Dispense: 120 mL; Refill: 0 - levofloxacin (LEVAQUIN) 500 MG tablet; Take 1 tablet (500 mg total) by mouth daily.  Dispense: 7 tablet; Refill: 0   Halina Maidens, MD [facility]  05/10/2015

## 2015-05-10 NOTE — Patient Instructions (Signed)

## 2015-05-23 DIAGNOSIS — L538 Other specified erythematous conditions: Secondary | ICD-10-CM | POA: Diagnosis not present

## 2015-05-23 DIAGNOSIS — D2239 Melanocytic nevi of other parts of face: Secondary | ICD-10-CM | POA: Diagnosis not present

## 2015-05-23 DIAGNOSIS — R208 Other disturbances of skin sensation: Secondary | ICD-10-CM | POA: Diagnosis not present

## 2015-05-23 DIAGNOSIS — L298 Other pruritus: Secondary | ICD-10-CM | POA: Diagnosis not present

## 2015-05-23 DIAGNOSIS — D220 Melanocytic nevi of lip: Secondary | ICD-10-CM | POA: Diagnosis not present

## 2015-05-23 DIAGNOSIS — Z1283 Encounter for screening for malignant neoplasm of skin: Secondary | ICD-10-CM | POA: Diagnosis not present

## 2015-06-08 ENCOUNTER — Encounter: Payer: Self-pay | Admitting: *Deleted

## 2015-06-14 ENCOUNTER — Other Ambulatory Visit: Payer: Self-pay | Admitting: Orthopedic Surgery

## 2015-06-14 DIAGNOSIS — M25572 Pain in left ankle and joints of left foot: Secondary | ICD-10-CM | POA: Diagnosis not present

## 2015-06-14 DIAGNOSIS — M5416 Radiculopathy, lumbar region: Secondary | ICD-10-CM | POA: Diagnosis not present

## 2015-07-02 DIAGNOSIS — H53002 Unspecified amblyopia, left eye: Secondary | ICD-10-CM | POA: Diagnosis not present

## 2015-07-04 ENCOUNTER — Ambulatory Visit
Admission: RE | Admit: 2015-07-04 | Discharge: 2015-07-04 | Disposition: A | Discharge status: Home / Self Care | Payer: Medicare Other | Source: Ambulatory Visit | Attending: Orthopedic Surgery | Admitting: Orthopedic Surgery

## 2015-07-04 DIAGNOSIS — M5416 Radiculopathy, lumbar region: Secondary | ICD-10-CM | POA: Diagnosis not present

## 2015-07-04 DIAGNOSIS — M5116 Intervertebral disc disorders with radiculopathy, lumbar region: Secondary | ICD-10-CM | POA: Insufficient documentation

## 2015-07-04 DIAGNOSIS — M4806 Spinal stenosis, lumbar region: Secondary | ICD-10-CM | POA: Diagnosis not present

## 2015-07-06 DIAGNOSIS — M5126 Other intervertebral disc displacement, lumbar region: Secondary | ICD-10-CM | POA: Diagnosis not present

## 2015-07-06 DIAGNOSIS — M4806 Spinal stenosis, lumbar region: Secondary | ICD-10-CM | POA: Diagnosis not present

## 2015-07-10 DIAGNOSIS — D485 Neoplasm of uncertain behavior of skin: Secondary | ICD-10-CM | POA: Diagnosis not present

## 2015-07-10 DIAGNOSIS — D2239 Melanocytic nevi of other parts of face: Secondary | ICD-10-CM | POA: Diagnosis not present

## 2015-08-07 DIAGNOSIS — L905 Scar conditions and fibrosis of skin: Secondary | ICD-10-CM | POA: Diagnosis not present

## 2015-08-07 DIAGNOSIS — L501 Idiopathic urticaria: Secondary | ICD-10-CM | POA: Diagnosis not present

## 2016-01-22 DIAGNOSIS — Z23 Encounter for immunization: Secondary | ICD-10-CM | POA: Diagnosis not present

## 2016-02-18 DIAGNOSIS — Z872 Personal history of diseases of the skin and subcutaneous tissue: Secondary | ICD-10-CM | POA: Diagnosis not present

## 2016-02-18 DIAGNOSIS — L2089 Other atopic dermatitis: Secondary | ICD-10-CM | POA: Diagnosis not present

## 2016-02-18 DIAGNOSIS — Z1283 Encounter for screening for malignant neoplasm of skin: Secondary | ICD-10-CM | POA: Diagnosis not present

## 2016-02-18 DIAGNOSIS — L720 Epidermal cyst: Secondary | ICD-10-CM | POA: Diagnosis not present

## 2016-03-11 ENCOUNTER — Ambulatory Visit (INDEPENDENT_AMBULATORY_CARE_PROVIDER_SITE_OTHER): Payer: Medicare Other | Admitting: Internal Medicine

## 2016-03-11 ENCOUNTER — Encounter: Payer: Self-pay | Admitting: Internal Medicine

## 2016-03-11 VITALS — BP 132/76 | HR 68 | Temp 98.6°F | Ht 66.0 in | Wt 166.0 lb

## 2016-03-11 DIAGNOSIS — J40 Bronchitis, not specified as acute or chronic: Secondary | ICD-10-CM | POA: Diagnosis not present

## 2016-03-11 MED ORDER — CEFDINIR 300 MG PO CAPS
300.0000 mg | ORAL_CAPSULE | Freq: Two times a day (BID) | ORAL | 0 refills | Status: DC
Start: 2016-03-11 — End: 2016-07-23

## 2016-03-11 MED ORDER — HYDROCODONE-HOMATROPINE 5-1.5 MG/5ML PO SYRP: 5.0000 mL | ORAL_SOLUTION | Freq: Four times a day (QID) | ORAL | 0 refills | Status: DC | PRN

## 2016-03-11 NOTE — Progress Notes (Signed)
Date:  03/11/2016   Name:  Melinda Perry [facility]   DOB:  [birth year]   MRN:  [id number]   Chief Complaint: Cough (Pt stated cough for about 1 month) Cough  This is a new problem. The current episode started 1 to 4 weeks ago. The problem has been gradually worsening. The problem occurs hourly. The cough is productive of sputum. Associated symptoms include nasal congestion and postnasal drip. Pertinent negatives include no chest pain, chills, fever, sore throat, shortness of breath or wheezing. She has tried OTC cough suppressant for the symptoms. The treatment provided no relief.      Review of Systems  Constitutional: Negative for chills and fever.  HENT: Positive for postnasal drip. Negative for sinus pressure and sore throat.   Respiratory: Positive for cough. Negative for shortness of breath and wheezing.   Cardiovascular: Negative for chest pain.    Patient Active Problem List   Diagnosis Date Noted  . Ankle pain 04/11/2015  . Xerosis cutis 04/11/2015  . Familial multiple lipoprotein-type hyperlipidemia 03/09/2015  . Compulsive tobacco user syndrome 03/09/2015  . Wedging of vertebra ([place]) 03/09/2015  . Abnormal glandular Papanicolaou smear of cervix 03/09/2015  . Alopecia 03/09/2015  . History of colon polyps 03/09/2015  . Benign hypertension 03/09/2015  . Microscopic hematuria 03/09/2015    Prior to Admission medications   Medication Sig Start Date End Date Taking? Authorizing Provider  atorvastatin (LIPITOR) 10 MG tablet Take 1 tablet (10 mg total) by mouth daily. 04/11/15  Yes Glean Hess, MD  ciclopirox (PENLAC) 8 % solution APP AA ON AND AROUND NAIL ATN 02/15/15  Yes Historical Provider, MD  clobetasol (TEMOVATE) 0.05 % external solution  04/04/13  Yes Historical Provider, MD  hydrocortisone cream 0.5 % Apply 1 application topically 2 (two) times daily. Apply twice a day to dermatitis - 04/11/15  Yes Glean Hess, MD  losartan (COZAAR) 25 MG tablet Take 1  tablet (25 mg total) by mouth daily. 04/11/15  Yes Glean Hess, MD    No Known Allergies  Past Surgical History:  Procedure Laterality Date  . AUGMENTATION MAMMAPLASTY Bilateral 1988  . BREAST ENHANCEMENT SURGERY    . BUNIONECTOMY Bilateral   . CERVICAL CONE BIOPSY  1971   LSIL  . TUBAL LIGATION      Social History  Substance Use Topics  . Smoking status: Current Every Day Smoker    Packs/day: 0.50    Years: 55.00    Types: Cigarettes  . Smokeless tobacco: Never Used  . Alcohol use 1.2 oz/week    2 Standard drinks or equivalent per week     Comment: occasionally     Medication list has been reviewed and updated.   Physical Exam  Constitutional: She is oriented to person, place, and time. She appears well-developed. No distress.  HENT:  Head: Normocephalic and atraumatic.  Right Ear: Tympanic membrane and ear canal normal.  Left Ear: Tympanic membrane and ear canal normal.  Nose: Right sinus exhibits no maxillary sinus tenderness. Left sinus exhibits no maxillary sinus tenderness.  Mouth/Throat: Posterior oropharyngeal erythema present. No oropharyngeal exudate or posterior oropharyngeal edema.  Cardiovascular: Normal rate, regular rhythm and normal heart sounds.   Pulmonary/Chest: Effort normal. No respiratory distress. She has decreased breath sounds. She has no wheezes.  Musculoskeletal: Normal range of motion.  Neurological: She is alert and oriented to person, place, and time.  Skin: Skin is warm and dry. No rash noted.  Psychiatric: She has a normal  mood and affect. Her behavior is normal. Thought content normal.  Nursing note and vitals reviewed.   BP 132/76   Pulse 68   Temp 98.6 F (37 C)   Ht 5\' 6"  (1.676 m)   Wt 166 lb (75.3 kg)   SpO2 98%   BMI 26.79 kg/m   Assessment and Plan: 1. Bronchitis Increase fluids Can stop Mucinex DM - cefdinir (OMNICEF) 300 MG capsule; Take 1 capsule (300 mg total) by mouth 2 (two) times daily.  Dispense: 20  capsule; Refill: 0 - HYDROcodone-homatropine (HYCODAN) 5-1.5 MG/5ML syrup; Take 5 mLs by mouth every 6 (six) hours as needed for cough.  Dispense: 120 mL; Refill: 0   Halina Maidens, MD [facility]  03/11/2016

## 2016-03-11 NOTE — Patient Instructions (Signed)
Can stop Mucinex Increase fluids

## 2016-04-03 ENCOUNTER — Other Ambulatory Visit: Payer: Self-pay | Admitting: Internal Medicine

## 2016-04-03 DIAGNOSIS — E7849 Other hyperlipidemia: Secondary | ICD-10-CM

## 2016-04-03 DIAGNOSIS — I10 Essential (primary) hypertension: Secondary | ICD-10-CM

## 2016-04-25 ENCOUNTER — Other Ambulatory Visit: Payer: Self-pay | Admitting: Internal Medicine

## 2016-04-25 DIAGNOSIS — Z1231 Encounter for screening mammogram for malignant neoplasm of breast: Secondary | ICD-10-CM

## 2016-05-08 ENCOUNTER — Ambulatory Visit
Admission: RE | Admit: 2016-05-08 | Discharge: 2016-05-08 | Disposition: A | Discharge status: Home / Self Care | Payer: Medicare Other | Source: Ambulatory Visit | Attending: Internal Medicine | Admitting: Internal Medicine

## 2016-05-08 DIAGNOSIS — Z1231 Encounter for screening mammogram for malignant neoplasm of breast: Secondary | ICD-10-CM | POA: Insufficient documentation

## 2016-05-29 HISTORY — PX: OTHER SURGICAL HISTORY: SHX169

## 2016-06-26 DIAGNOSIS — M545 Low back pain: Secondary | ICD-10-CM | POA: Diagnosis not present

## 2016-06-26 DIAGNOSIS — M48062 Spinal stenosis, lumbar region with neurogenic claudication: Secondary | ICD-10-CM | POA: Diagnosis not present

## 2016-07-03 DIAGNOSIS — M545 Low back pain: Secondary | ICD-10-CM | POA: Diagnosis not present

## 2016-07-03 DIAGNOSIS — M47816 Spondylosis without myelopathy or radiculopathy, lumbar region: Secondary | ICD-10-CM | POA: Diagnosis not present

## 2016-07-23 ENCOUNTER — Ambulatory Visit (INDEPENDENT_AMBULATORY_CARE_PROVIDER_SITE_OTHER): Payer: Medicare Other | Admitting: Internal Medicine

## 2016-07-23 ENCOUNTER — Encounter: Payer: Self-pay | Admitting: Internal Medicine

## 2016-07-23 VITALS — BP 126/68 | HR 58 | Ht 66.0 in | Wt 162.3 lb

## 2016-07-23 DIAGNOSIS — I10 Essential (primary) hypertension: Secondary | ICD-10-CM

## 2016-07-23 DIAGNOSIS — Z0000 Encounter for general adult medical examination without abnormal findings: Secondary | ICD-10-CM

## 2016-07-23 DIAGNOSIS — L639 Alopecia areata, unspecified: Secondary | ICD-10-CM | POA: Insufficient documentation

## 2016-07-23 DIAGNOSIS — F17200 Nicotine dependence, unspecified, uncomplicated: Secondary | ICD-10-CM

## 2016-07-23 DIAGNOSIS — E7849 Other hyperlipidemia: Secondary | ICD-10-CM

## 2016-07-23 DIAGNOSIS — E784 Other hyperlipidemia: Secondary | ICD-10-CM | POA: Diagnosis not present

## 2016-07-23 DIAGNOSIS — R002 Palpitations: Secondary | ICD-10-CM

## 2016-07-23 LAB — POCT URINALYSIS DIPSTICK
Bilirubin, UA: NEGATIVE
Glucose, UA: NEGATIVE
Ketones, UA: NEGATIVE
Leukocytes, UA: NEGATIVE
Nitrite, UA: NEGATIVE
Protein, UA: NEGATIVE
Spec Grav, UA: 1.01 (ref 1.010–1.025)
Urobilinogen, UA: 0.2 E.U./dL
pH, UA: 5 (ref 5.0–8.0)

## 2016-07-23 MED ORDER — LOSARTAN POTASSIUM 25 MG PO TABS: 25.0000 mg | ORAL_TABLET | Freq: Every day | ORAL | 3 refills | Status: DC

## 2016-07-23 MED ORDER — ATORVASTATIN CALCIUM 10 MG PO TABS: 10.0000 mg | ORAL_TABLET | Freq: Every day | ORAL | 3 refills | Status: DC

## 2016-07-23 NOTE — Progress Notes (Signed)
Patient: Melinda Perry, Female    DOB: 03-Mar-1946, 71 y.o.   MRN: [id number] Visit Date: 07/23/2016  Today's Provider: Halina Maidens, MD   Chief Complaint  Patient presents with  . Medicare Wellness    Pt just had mammo couple months ago and does not want exam today.  . Hypertension  . Hyperlipidemia   Subjective:    Annual wellness visit Melinda Perry is a 71 y.o. female who presents today for her Subsequent Annual Wellness Visit. She feels well. She reports exercising walking. She reports she is sleeping fairly well.  She denies breast issues, recent mammogram was normal.  Her back is improved after steroid injections. ----------------------------------------------------------- Hypertension  This is a chronic problem. The problem is controlled. Associated symptoms include palpitations. Pertinent negatives include no chest pain, headaches or shortness of breath. Past treatments include angiotensin blockers.  Hyperlipidemia  This is a chronic problem. The problem is controlled. Pertinent negatives include no chest pain or shortness of breath. Current antihyperlipidemic treatment includes statins. The current treatment provides significant improvement of lipids.  Palpitations   This is a chronic problem. The current episode started more than 1 year ago. The problem occurs intermittently. Episode Length: started years ago - cardiac eval negative; never took medication; sx worse at night. Pertinent negatives include no anxiety, chest pain, coughing, dizziness, fever, shortness of breath or vomiting. She has tried nothing for the symptoms. There are no known risk factors.  Tobacco - tried Zyban and had hair loss.  Tried patches and gained 12 lbs.  Not interested in quitting at this time and wary of Chantix.  Would like to lose 12 more lbs and then try again.  Review of Systems  Constitutional: Negative for chills, fatigue and fever.  HENT: Negative for congestion, hearing  loss, tinnitus, trouble swallowing and voice change.   Eyes: Negative for visual disturbance.  Respiratory: Negative for cough, chest tightness, shortness of breath and wheezing.   Cardiovascular: Positive for palpitations. Negative for chest pain and leg swelling.  Gastrointestinal: Negative for abdominal pain, constipation, diarrhea and vomiting.  Endocrine: Negative for polydipsia and polyuria.  Genitourinary: Negative for dysuria, frequency, genital sores, vaginal bleeding and vaginal discharge.  Musculoskeletal: Negative for arthralgias, gait problem and joint swelling.  Skin: Negative for color change and rash.       One patch of hair loss on posterior scalp Several small milia on right neck/shoulder  Neurological: Negative for dizziness, tremors, light-headedness and headaches.  Hematological: Negative for adenopathy. Does not bruise/bleed easily.  Psychiatric/Behavioral: Negative for dysphoric mood and sleep disturbance. The patient is not nervous/anxious.     Social History   Social History  . Marital status: Divorced    Spouse name: N/A  . Number of children: N/A  . Years of education: N/A   Occupational History  . Not on file.   Social History Main Topics  . Smoking status: Current Every Day Smoker    Packs/day: 0.50    Years: 55.00    Types: Cigarettes, E-cigarettes  . Smokeless tobacco: Never Used  . Alcohol use 1.2 oz/week    2 Standard drinks or equivalent per week     Comment: occasionally  . Drug use: No  . Sexual activity: Not on file   Other Topics Concern  . Not on file   Social History Narrative  . No narrative on file    Patient Active Problem List   Diagnosis Date Noted  . Alopecia areata 07/23/2016  .  Heart palpitations 07/23/2016  . Ankle pain 04/11/2015  . Xerosis cutis 04/11/2015  . Familial multiple lipoprotein-type hyperlipidemia 03/09/2015  . Compulsive tobacco user syndrome 03/09/2015  . Wedging of vertebra ([place]) 03/09/2015  .  Alopecia 03/09/2015  . History of colon polyps 03/09/2015  . Benign hypertension 03/09/2015  . Microscopic hematuria 03/09/2015    Past Surgical History:  Procedure Laterality Date  . AUGMENTATION MAMMAPLASTY Bilateral 1988  . BREAST ENHANCEMENT SURGERY    . BUNIONECTOMY Bilateral   . CERVICAL CONE BIOPSY  1971   LSIL  . facet shots in back  05/2016   [facility]-   . TUBAL LIGATION      Her family history includes Lung cancer in her mother; Pulmonary fibrosis in her father.     Previous Medications   CICLOPIROX (PENLAC) 8 % SOLUTION    APP AA ON AND AROUND NAIL ATN   CLOBETASOL (TEMOVATE) 0.05 % EXTERNAL SOLUTION       HYDROCORTISONE CREAM 0.5 %    Apply 1 application topically 2 (two) times daily. Apply twice a day to dermatitis -    Patient Care Team: Glean Hess, MD as PCP - General (Family Medicine)      Objective:   Vitals: BP 126/68   Pulse (!) 58   Ht 5\' 6"  (1.676 m)   Wt 162 lb 4.8 oz (73.6 kg)   SpO2 99%   BMI 26.20 kg/m   Physical Exam  Constitutional: She is oriented to person, place, and time. She appears well-developed and well-nourished. No distress.  HENT:  Head: Normocephalic and atraumatic.  Right Ear: Tympanic membrane and ear canal normal.  Left Ear: Tympanic membrane and ear canal normal.  Nose: Right sinus exhibits no maxillary sinus tenderness. Left sinus exhibits no maxillary sinus tenderness.  Mouth/Throat: Uvula is midline and oropharynx is clear and moist.  Eyes: Conjunctivae and EOM are normal. Right eye exhibits no discharge. Left eye exhibits no discharge. No scleral icterus.  Neck: Normal range of motion. Carotid bruit is not present. No erythema present. No thyromegaly present.  Cardiovascular: Normal rate, regular rhythm, S1 normal, normal heart sounds, intact distal pulses and normal pulses.   Occasional extrasystoles are present.  Pulmonary/Chest: Effort normal. No respiratory distress. She has no wheezes.  Abdominal: Soft.  Bowel sounds are normal. There is no hepatosplenomegaly. There is no tenderness. There is no CVA tenderness.  Musculoskeletal: Normal range of motion. She exhibits no edema or tenderness.  Lymphadenopathy:    She has no cervical adenopathy.    She has no axillary adenopathy.  Neurological: She is alert and oriented to person, place, and time. She has normal reflexes. No cranial nerve deficit or sensory deficit.  Skin: Skin is warm, dry and intact. No rash noted.  1 cm area of alopecia lower left occipital region  Psychiatric: She has a normal mood and affect. Her speech is normal and behavior is normal. Thought content normal.  Nursing note and vitals reviewed.   Activities of Daily Living In your present state of health, do you have any difficulty performing the following activities: 07/23/2016  Hearing? N  Vision? N  Difficulty concentrating or making decisions? N  Walking or climbing stairs? N  Dressing or bathing? N  Doing errands, shopping? N  Preparing Food and eating ? N  Using the Toilet? N  In the past six months, have you accidently leaked urine? Y  Do you have problems with loss of bowel control? N  Managing your  Medications? N  Managing your Finances? N  Housekeeping or managing your Housekeeping? N  Some recent data might be hidden    Fall Risk Assessment Fall Risk  07/23/2016 04/11/2015  Falls in the past year? No No      Depression Screen PHQ 2/9 Scores 07/23/2016 04/11/2015 04/11/2015  PHQ - 2 Score 0 0 3  PHQ- 9 Score - - 3    6CIT Screen 07/23/2016  What Year? 0 points  What month? 0 points  What time? 0 points  Count back from 20 0 points  Months in reverse 0 points  Repeat phrase 0 points  Total Score 0    Medicare Annual Wellness Visit Summary:  Reviewed patient's Family Medical History Reviewed and updated list of patient's medical providers Assessment of cognitive impairment was done Assessed patient's functional ability Established a written  schedule for health screening [place] Completed and Reviewed  Exercise Activities and Dietary recommendations Goals    . Weight < 200 lb (90.719 kg)       Immunization History  Administered Date(s) Administered  . Influenza-Unspecified 12/30/2014  . Pneumococcal Conjugate-13 11/16/2013  . Pneumococcal Polysaccharide-23 04/01/2010  . Tdap 04/01/2010  . Zoster 04/01/2010    Health Maintenance  Topic Date Due  . INFLUENZA VACCINE  10/29/2016  . MAMMOGRAM  05/08/2018  . TETANUS/TDAP  04/01/2020  . COLONOSCOPY  12/03/2022  . DEXA SCAN  Addressed  . Hepatitis C Screening  Addressed  . PNA vac Low Risk Adult  Completed    Discussed health benefits of physical activity, and encouraged her to engage in regular exercise appropriate for her age and condition.    ------------------------------------------------------------------------------------------------------------  Assessment & Plan:   1. Medicare annual wellness visit, subsequent Measures satisfied Rx for Shingrix given - POCT urinalysis dipstick  2. Benign hypertension controlled - CBC with Differential/Platelet - Comprehensive metabolic panel - TSH - losartan (COZAAR) 25 MG tablet; Take 1 tablet (25 mg total) by mouth daily.  Dispense: 90 tablet; Refill: 3  3. Familial multiple lipoprotein-type hyperlipidemia On statin therapy - Lipid panel - atorvastatin (LIPITOR) 10 MG tablet; Take 1 tablet (10 mg total) by mouth daily at 6 PM.  Dispense: 90 tablet; Refill: 3  4. Compulsive tobacco user syndrome Not interested in quitting at this time due to concerns over weight gain  5. Alopecia areata Consult Dermatology  6. Heart palpitations Patient reassured - EKG WNL except for one PVC - EKG 12-Lead   Meds ordered this encounter  Medications  . losartan (COZAAR) 25 MG tablet    Sig: Take 1 tablet (25 mg total) by mouth daily.    Dispense:  90 tablet    Refill:  3  . atorvastatin  (LIPITOR) 10 MG tablet    Sig: Take 1 tablet (10 mg total) by mouth daily at 6 PM.    Dispense:  90 tablet    Refill:  [place], MD [facility]  07/23/2016

## 2016-07-23 NOTE — Patient Instructions (Signed)
Health Maintenance  Topic Date Due  . INFLUENZA VACCINE  10/29/2016  . MAMMOGRAM  05/08/2018  . TETANUS/TDAP  04/01/2020  . COLONOSCOPY  12/03/2022  . DEXA SCAN  Addressed  . Hepatitis C Screening  Addressed  . PNA vac Low Risk Adult  Completed

## 2016-07-24 LAB — CBC WITH DIFFERENTIAL/PLATELET
BASOS ABS: 0 10*3/uL (ref 0.0–0.2)
Basos: 0 %
EOS (ABSOLUTE): 0.1 10*3/uL (ref 0.0–0.4)
EOS: 1 %
HEMATOCRIT: 46 % (ref 34.0–46.6)
Hemoglobin: 15.2 g/dL (ref 11.1–15.9)
IMMATURE GRANULOCYTES: 0 %
Immature Grans (Abs): 0 10*3/uL (ref 0.0–0.1)
Lymphocytes Absolute: 2.2 10*3/uL (ref 0.7–3.1)
Lymphs: 26 %
MCH: 30 pg (ref 26.6–33.0)
MCHC: 33 g/dL (ref 31.5–35.7)
MCV: 91 fL (ref 79–97)
MONOCYTES: 6 %
MONOS ABS: 0.5 10*3/uL (ref 0.1–0.9)
NEUTROS PCT: 67 %
Neutrophils Absolute: 5.4 10*3/uL (ref 1.4–7.0)
Platelets: 193 10*3/uL (ref 150–379)
RBC: 5.06 x10E6/uL (ref 3.77–5.28)
RDW: 14.9 % (ref 12.3–15.4)
WBC: 8.2 10*3/uL (ref 3.4–10.8)

## 2016-07-24 LAB — COMPREHENSIVE METABOLIC PANEL
ALBUMIN: 4.3 g/dL (ref 3.5–4.8)
ALT: 17 IU/L (ref 0–32)
AST: 18 IU/L (ref 0–40)
Albumin/Globulin Ratio: 1.5 (ref 1.2–2.2)
Alkaline Phosphatase: 86 IU/L (ref 39–117)
BUN / CREAT RATIO: 30 — AB (ref 12–28)
BUN: 21 mg/dL (ref 8–27)
Bilirubin Total: 0.4 mg/dL (ref 0.0–1.2)
CALCIUM: 9.6 mg/dL (ref 8.7–10.3)
CHLORIDE: 102 mmol/L (ref 96–106)
CO2: 22 mmol/L (ref 18–29)
CREATININE: 0.69 mg/dL (ref 0.57–1.00)
GFR calc Af Amer: 101 mL/min/{1.73_m2} (ref >59)
GFR calc non Af Amer: 87 mL/min/{1.73_m2} (ref >59)
GLOBULIN, TOTAL: 2.8 g/dL (ref 1.5–4.5)
Glucose: 90 mg/dL (ref 65–99)
Potassium: 4.8 mmol/L (ref 3.5–5.2)
SODIUM: 141 mmol/L (ref 134–144)
Total Protein: 7.1 g/dL (ref 6.0–8.5)

## 2016-07-24 LAB — LIPID PANEL
CHOLESTEROL TOTAL: 146 mg/dL (ref 100–199)
Chol/HDL Ratio: 2.5 ratio (ref 0.0–4.4)
HDL: 59 mg/dL (ref >39)
LDL CALC: 78 mg/dL (ref 0–99)
TRIGLYCERIDES: 46 mg/dL (ref 0–149)
VLDL Cholesterol Cal: 9 mg/dL (ref 5–40)

## 2016-07-24 LAB — TSH: TSH: 1.83 u[IU]/mL (ref 0.450–4.500)

## 2016-09-15 DIAGNOSIS — L821 Other seborrheic keratosis: Secondary | ICD-10-CM | POA: Diagnosis not present

## 2016-09-15 DIAGNOSIS — D225 Melanocytic nevi of trunk: Secondary | ICD-10-CM | POA: Diagnosis not present

## 2016-09-15 DIAGNOSIS — L638 Other alopecia areata: Secondary | ICD-10-CM | POA: Diagnosis not present

## 2016-10-03 DIAGNOSIS — I83893 Varicose veins of bilateral lower extremities with other complications: Secondary | ICD-10-CM | POA: Diagnosis not present

## 2016-11-26 ENCOUNTER — Telehealth: Payer: Self-pay

## 2016-11-26 NOTE — Telephone Encounter (Signed)
I don't know of a reason she cant have local vein procedures.

## 2016-11-26 NOTE — Telephone Encounter (Signed)
Pt informed

## 2016-11-26 NOTE — Telephone Encounter (Signed)
Patient called leaving a VM requesting stating she is having treatments for her veins in legs soon. Wants to make sure nothing in her history would be a reason why she shouldn't have these treatments done? Please Advise.

## 2017-01-06 DIAGNOSIS — Z23 Encounter for immunization: Secondary | ICD-10-CM | POA: Diagnosis not present

## 2017-01-21 DIAGNOSIS — I83893 Varicose veins of bilateral lower extremities with other complications: Secondary | ICD-10-CM | POA: Diagnosis not present

## 2017-01-22 ENCOUNTER — Ambulatory Visit (INDEPENDENT_AMBULATORY_CARE_PROVIDER_SITE_OTHER): Payer: Medicare Other | Admitting: Internal Medicine

## 2017-01-22 ENCOUNTER — Encounter: Payer: Self-pay | Admitting: Internal Medicine

## 2017-01-22 VITALS — BP 124/80 | HR 58 | Ht 66.0 in | Wt 162.0 lb

## 2017-01-22 DIAGNOSIS — I10 Essential (primary) hypertension: Secondary | ICD-10-CM

## 2017-01-22 DIAGNOSIS — F17201 Nicotine dependence, unspecified, in remission: Secondary | ICD-10-CM

## 2017-01-22 NOTE — Progress Notes (Signed)
Date:  01/22/2017   Name:  Melinda Perry [facility]   DOB:  [birth year]   MRN:  [id number]   Chief Complaint: Hypertension (Haven't taken losartan today. Took extra dose yesterday because BP was too high. And feeling odd. )  Hypertension  This is a chronic problem. The problem has been waxing and waning since onset. The problem is controlled (but very high yesterday). Pertinent negatives include no chest pain, headaches, palpitations or shortness of breath.  She had a vein procedure yesterday and BP was very high.  She had the procedure and when she got home it was still high.  She took an extra losartan last evening but did not take her BP any more.  She slept well.  She has no CP or SOB or dizziness.  She feels a bit "off" but no specific complaints.   Review of Systems  Constitutional: Negative for chills, fatigue and fever.  Respiratory: Negative for chest tightness, shortness of breath and wheezing.   Cardiovascular: Negative for chest pain and palpitations.  Neurological: Negative for dizziness, weakness and headaches.  Psychiatric/Behavioral: Negative for confusion, decreased concentration and sleep disturbance.    Patient Active Problem List   Diagnosis Date Noted  . Alopecia areata 07/23/2016  . Heart palpitations 07/23/2016  . Ankle pain 04/11/2015  . Xerosis cutis 04/11/2015  . Familial multiple lipoprotein-type hyperlipidemia 03/09/2015  . Tobacco use disorder, mild, in early remission 03/09/2015  . Wedging of vertebra ([place]) 03/09/2015  . Alopecia 03/09/2015  . History of colon polyps 03/09/2015  . Benign hypertension 03/09/2015  . Microscopic hematuria 03/09/2015    Prior to Admission medications   Medication Sig Start Date End Date Taking? Authorizing Provider  atorvastatin (LIPITOR) 10 MG tablet Take 1 tablet (10 mg total) by mouth daily at 6 PM. 07/23/16  Yes Glean Hess, MD  ciclopirox [facility]) 8 % solution APP AA ON AND AROUND NAIL ATN 02/15/15  Yes  [provider]  clobetasol (TEMOVATE) 0.05 % external solution  04/04/13  Yes [provider]  losartan (COZAAR) 25 MG tablet Take 1 tablet (25 mg total) by mouth daily. 07/23/16  Yes Glean Hess, MD    Allergies  Allergen Reactions  . Bupropion Other (See Comments)    Hair loss    Past Surgical History:  Procedure Laterality Date  . AUGMENTATION MAMMAPLASTY Bilateral 1988  . BREAST ENHANCEMENT SURGERY    . BUNIONECTOMY Bilateral   . CERVICAL CONE BIOPSY  1971   LSIL  . facet shots in back  05/2016   [facility]-   . TUBAL LIGATION      Social History  Substance Use Topics  . Smoking status: Former Smoker    Packs/day: 0.00    Years: 55.00    Quit date: 01/05/2017  . Smokeless tobacco: Never Used  . Alcohol use 1.2 oz/week    2 Standard drinks or equivalent per week     Comment: occasionally     Medication list has been reviewed and updated.  PHQ 2/9 Scores 07/23/2016 04/11/2015 04/11/2015  PHQ - 2 Score 0 0 3  PHQ- 9 Score - - 3    Physical Exam  Constitutional: She is oriented to person, place, and time. She appears well-developed. No distress.  HENT:  Head: Normocephalic and atraumatic.  Neck: Normal range of motion. Neck supple. Carotid bruit is not present. No thyromegaly present.  Cardiovascular: Normal rate, regular rhythm and normal heart sounds.   Pulmonary/Chest: Effort normal and breath  sounds normal. No respiratory distress. She has no wheezes.  Neurological: She is alert and oriented to person, place, and time.  Skin: Skin is warm and dry. No rash noted.  Psychiatric: She has a normal mood and affect. Her behavior is normal. Thought content normal.  Nursing note and vitals reviewed.   BP 124/80   Pulse (!) 58   Ht 5\' 6"  (1.676 m)   Wt 162 lb (73.5 kg)   SpO2 96%   BMI 26.15 kg/m   Assessment and Plan: 1. Benign hypertension Normal today Continue usual dose of losartan daily Monitor BP at home three times per  week Return if readings are persistently elevated  2. Tobacco use disorder, mild, in early remission Congratulated on cessation efforts using nicoderm patches.   No orders of the defined types were placed in this encounter.   Partially dictated using Editor, commissioning. Any errors are unintentional.  Halina Maidens, MD [facility]  01/22/2017

## 2017-01-26 ENCOUNTER — Ambulatory Visit (INDEPENDENT_AMBULATORY_CARE_PROVIDER_SITE_OTHER): Payer: Medicare Other | Admitting: Internal Medicine

## 2017-01-26 ENCOUNTER — Encounter: Payer: Self-pay | Admitting: Internal Medicine

## 2017-01-26 VITALS — BP 146/86 | HR 78 | Resp 16 | Ht 66.0 in | Wt 162.0 lb

## 2017-01-26 DIAGNOSIS — I498 Other specified cardiac arrhythmias: Secondary | ICD-10-CM | POA: Diagnosis not present

## 2017-01-26 DIAGNOSIS — I10 Essential (primary) hypertension: Secondary | ICD-10-CM

## 2017-01-26 NOTE — Progress Notes (Signed)
Date:  01/26/2017   Name:  Melinda Perry [facility]   DOB:  [birth year]   MRN:  [id number]   Chief Complaint: Palpitations She has always had palpitations, evaluated by cardiology with normal stress test years ago.  Never needed to treat the palpitations.  Over the past few days they seem more bothersome and different from the usual.  No chest pain or sob, no lightheadedness.  BP has still been slightly elevated.   Review of Systems  Constitutional: Negative for chills and fatigue.  Respiratory: Negative for chest tightness, shortness of breath and wheezing.   Cardiovascular: Positive for palpitations. Negative for chest pain.  Gastrointestinal: Negative for abdominal pain.  Neurological: Negative for dizziness, light-headedness, numbness and headaches.    Patient Active Problem List   Diagnosis Date Noted  . Alopecia areata 07/23/2016  . Heart palpitations 07/23/2016  . Ankle pain 04/11/2015  . Xerosis cutis 04/11/2015  . Familial multiple lipoprotein-type hyperlipidemia 03/09/2015  . Tobacco use disorder, mild, in early remission 03/09/2015  . Wedging of vertebra ([place]) 03/09/2015  . Alopecia 03/09/2015  . History of colon polyps 03/09/2015  . Benign hypertension 03/09/2015  . Microscopic hematuria 03/09/2015    Prior to Admission medications   Medication Sig Start Date End Date Taking? Authorizing Provider  atorvastatin (LIPITOR) 10 MG tablet Take 1 tablet (10 mg total) by mouth daily at 6 PM. 07/23/16  Yes Glean Hess, MD  ciclopirox [facility]) 8 % solution APP AA ON AND AROUND NAIL ATN 02/15/15  Yes [provider]  clobetasol (TEMOVATE) 0.05 % external solution  04/04/13  Yes [provider]  losartan (COZAAR) 25 MG tablet Take 1 tablet (25 mg total) by mouth daily. 07/23/16  Yes Glean Hess, MD    Allergies  Allergen Reactions  . Bupropion Other (See Comments)    Hair loss    Past Surgical History:  Procedure Laterality Date  .  AUGMENTATION MAMMAPLASTY Bilateral 1988  . BREAST ENHANCEMENT SURGERY    . BUNIONECTOMY Bilateral   . CERVICAL CONE BIOPSY  1971   LSIL  . facet shots in back  05/2016   [facility]-   . TUBAL LIGATION      Social History  Substance Use Topics  . Smoking status: Former Smoker    Packs/day: 0.00    Years: 55.00    Quit date: 01/05/2017  . Smokeless tobacco: Never Used  . Alcohol use 1.2 oz/week    2 Standard drinks or equivalent per week     Comment: occasionally     Medication list has been reviewed and updated.  PHQ 2/9 Scores 07/23/2016 04/11/2015 04/11/2015  PHQ - 2 Score 0 0 3  PHQ- 9 Score - - 3    Physical Exam  Constitutional: She appears well-developed and well-nourished. No distress.  Cardiovascular: Normal rate, regular rhythm and normal heart sounds.  Frequent extrasystoles are present.  Pulmonary/Chest: Breath sounds normal. She has no wheezes. She has no rhonchi.  Musculoskeletal: She exhibits no edema.    BP (!) 152/96   Pulse 78   Resp 16   Ht 5\' 6"  (1.676 m)   Wt 162 lb (73.5 kg)   SpO2 98%   BMI 26.15 kg/m   Assessment and Plan: 1. Other cardiac arrhythmia Will place ZIO patch monitor  2. Benign hypertension Continue current medications   No orders of the defined types were placed in this encounter.   Partially dictated using Editor, commissioning. Any errors are unintentional.  Halina Maidens, MD [facility]  01/26/2017

## 2017-01-28 DIAGNOSIS — I83893 Varicose veins of bilateral lower extremities with other complications: Secondary | ICD-10-CM | POA: Diagnosis not present

## 2017-02-04 DIAGNOSIS — I83893 Varicose veins of bilateral lower extremities with other complications: Secondary | ICD-10-CM | POA: Diagnosis not present

## 2017-02-11 DIAGNOSIS — I83893 Varicose veins of bilateral lower extremities with other complications: Secondary | ICD-10-CM | POA: Diagnosis not present

## 2017-02-11 DIAGNOSIS — R002 Palpitations: Secondary | ICD-10-CM | POA: Diagnosis not present

## 2017-02-13 ENCOUNTER — Telehealth: Payer: Self-pay | Admitting: Internal Medicine

## 2017-02-13 DIAGNOSIS — R002 Palpitations: Secondary | ICD-10-CM

## 2017-02-13 MED ORDER — METOPROLOL SUCCINATE ER 25 MG PO TB24: 12.5000 mg | ORAL_TABLET | Freq: Every day | ORAL | 2 refills | Status: DC

## 2017-02-13 NOTE — Telephone Encounter (Signed)
Called pt to discuss ZIO results.  No malignant rhythm noted.  Frequent SVE/PACs and few episodes of SVT - longest 4 beats. Pt is still feeling the palpitations but having no other sx and not worsening. Will start low dose beta blocker.  Consider refer to Cardiology if no improvement.

## 2017-03-20 DIAGNOSIS — I83893 Varicose veins of bilateral lower extremities with other complications: Secondary | ICD-10-CM | POA: Diagnosis not present

## 2017-03-26 DIAGNOSIS — I83893 Varicose veins of bilateral lower extremities with other complications: Secondary | ICD-10-CM | POA: Diagnosis not present

## 2017-04-06 ENCOUNTER — Telehealth: Payer: Self-pay

## 2017-04-06 NOTE — Telephone Encounter (Signed)
Patient informed- sent to front desk to schedule appt.

## 2017-04-06 NOTE — Telephone Encounter (Signed)
Patient called stating was put on BP meds- metoprolol a few months ago. BP has been running consistently high around 155/75 with Pulse staying around 50. Wanted to know if she should come in for OV or just cont taking meds as she is.  Please Advise.

## 2017-04-06 NOTE — Telephone Encounter (Signed)
She needs an OV to evaluate and adjust medication.

## 2017-04-07 ENCOUNTER — Encounter: Payer: Self-pay | Admitting: Internal Medicine

## 2017-04-07 ENCOUNTER — Ambulatory Visit (INDEPENDENT_AMBULATORY_CARE_PROVIDER_SITE_OTHER): Payer: Medicare Other | Admitting: Internal Medicine

## 2017-04-07 VITALS — BP 142/74 | HR 72 | Ht 66.0 in | Wt 167.0 lb

## 2017-04-07 DIAGNOSIS — I10 Essential (primary) hypertension: Secondary | ICD-10-CM

## 2017-04-07 DIAGNOSIS — R002 Palpitations: Secondary | ICD-10-CM

## 2017-04-07 MED ORDER — DILTIAZEM HCL ER 120 MG PO CP24: 120.0000 mg | ORAL_CAPSULE | Freq: Every day | ORAL | 2 refills | Status: DC

## 2017-04-07 NOTE — Progress Notes (Signed)
Date:  04/07/2017   Name:  Melinda Perry [facility]   DOB:  Jul 21, 1945   MRN:  [id number]   Chief Complaint: Hypertension (Been elevated consistently. Yesterday 156/76 PULSE 47. Brought her BP cuff today to compare.)  Hypertension  This is a chronic problem. The problem has been waxing and waning since onset. Associated symptoms include palpitations. Pertinent negatives include no chest pain, headaches or shortness of breath. Past treatments include beta blockers and angiotensin blockers. The current treatment provides moderate improvement.  BP  140/78   Review of Systems  Constitutional: Positive for fatigue. Negative for chills and fever.  Respiratory: Negative for chest tightness, shortness of breath and wheezing.   Cardiovascular: Positive for palpitations. Negative for chest pain and leg swelling.  Gastrointestinal: Negative for constipation and diarrhea.  Neurological: Negative for dizziness, tremors, syncope and headaches.    Patient Active Problem List   Diagnosis Date Noted  . Alopecia areata 07/23/2016  . Heart palpitations 07/23/2016  . Ankle pain 04/11/2015  . Xerosis cutis 04/11/2015  . Familial multiple lipoprotein-type hyperlipidemia 03/09/2015  . Tobacco use disorder, mild, in early remission 03/09/2015  . Wedging of vertebra ([place]) 03/09/2015  . History of colon polyps 03/09/2015  . Benign hypertension 03/09/2015  . Microscopic hematuria 03/09/2015    Prior to Admission medications   Medication Sig Start Date End Date Taking? Authorizing Provider  atorvastatin (LIPITOR) 10 MG tablet Take 1 tablet (10 mg total) by mouth daily at 6 PM. 07/23/16  Yes Glean Hess, MD  ciclopirox [facility]) 8 % solution APP AA ON AND AROUND NAIL ATN 02/15/15  Yes [provider]  clobetasol (TEMOVATE) 0.05 % external solution  04/04/13  Yes [provider]  losartan (COZAAR) 25 MG tablet Take 1 tablet (25 mg total) by mouth daily. 07/23/16  Yes Glean Hess, MD    metoprolol succinate (TOPROL XL) 25 MG 24 hr tablet Take 0.5 tablets (12.5 mg total) daily by mouth. 02/13/17  Yes Glean Hess, MD    Allergies  Allergen Reactions  . Bupropion Other (See Comments)    Hair loss    Past Surgical History:  Procedure Laterality Date  . AUGMENTATION MAMMAPLASTY Bilateral 1988  . BREAST ENHANCEMENT SURGERY    . BUNIONECTOMY Bilateral   . CERVICAL CONE BIOPSY  1971   LSIL  . facet shots in back  05/2016   [facility]   . TUBAL LIGATION      Social History   Tobacco Use  . Smoking status: Former Smoker    Packs/day: 0.00    Years: 55.00    Pack years: 0.00    Last attempt to quit: 01/05/2017    Years since quitting: 0.2  . Smokeless tobacco: Never Used  Substance Use Topics  . Alcohol use: Yes    Alcohol/week: 1.2 oz    Types: 2 Standard drinks or equivalent per week    Comment: occasionally  . Drug use: No     Medication list has been reviewed and updated.  PHQ 2/9 Scores 07/23/2016 04/11/2015 04/11/2015  PHQ - 2 Score 0 0 3  PHQ- 9 Score - - 3    Physical Exam  Constitutional: She is oriented to person, place, and time. She appears well-developed. No distress.  HENT:  Head: Normocephalic and atraumatic.  Cardiovascular: Regular rhythm and normal heart sounds. Frequent extrasystoles are present. Bradycardia present.  Pulmonary/Chest: Effort normal. No respiratory distress. She has no wheezes. She has no rhonchi.  Musculoskeletal: Normal  range of motion.  Neurological: She is alert and oriented to person, place, and time.  Skin: Skin is warm and dry. No rash noted.  Psychiatric: She has a normal mood and affect. Her behavior is normal. Thought content normal.  Nursing note and vitals reviewed.   BP (!) 142/74 Comment: Her BP cuff stated-177/98 [place]- 57  Pulse 72   Ht 5\' 6"  (1.676 m)   Wt 167 lb (75.8 kg)   SpO2 99%   BMI 26.95 kg/m   Assessment and Plan: 1. Benign hypertension Stop metoprolol Continue  losartan Monitor for improved BP, increased HR ~65 and control of palpitations - diltiazem (DILACOR XR) 120 MG 24 hr capsule; Take 1 capsule (120 mg total) by mouth daily.  Dispense: 30 capsule; Refill: 2  2. Heart palpitations - diltiazem (DILACOR XR) 120 MG 24 hr capsule; Take 1 capsule (120 mg total) by mouth daily.  Dispense: 30 capsule; Refill: 2   Meds ordered this encounter  Medications  . diltiazem (DILACOR XR) 120 MG 24 hr capsule    Sig: Take 1 capsule (120 mg total) by mouth daily.    Dispense:  30 capsule    Refill:  2    Partially dictated using Editor, commissioning. Any errors are unintentional.  Halina Maidens, MD [facility]  04/07/2017

## 2017-04-14 ENCOUNTER — Telehealth: Payer: Self-pay

## 2017-04-14 NOTE — Telephone Encounter (Signed)
Patient called stating she was just started on new meds for Cartia. She stopped taking it as of lastnight. Makes her head feel fuzzy, and makes her very tired. Doesn't like the way it feels. Wants to know if you can send diff medication in or would she need to come in for another visit.  Please Advise.

## 2017-04-14 NOTE — Telephone Encounter (Signed)
She should stop the Cartia, continue/resume metoprolol and I will refer to cardiology for better management.

## 2017-04-14 NOTE — Telephone Encounter (Signed)
Patient informed referral to cardiologist was put in. Stop Cartia and resume metoprolol. Will be contacted by someone from cardiology to schedule appt with them. Told to call if any questions about this.

## 2017-04-17 ENCOUNTER — Other Ambulatory Visit: Payer: Self-pay | Admitting: Internal Medicine

## 2017-04-17 ENCOUNTER — Telehealth: Payer: Self-pay

## 2017-04-17 DIAGNOSIS — R002 Palpitations: Secondary | ICD-10-CM

## 2017-04-17 NOTE — Telephone Encounter (Signed)
Patient still in need of referral to cardiologist. Refer to telephone call from Monday.   Please Advise.

## 2017-04-23 DIAGNOSIS — R5382 Chronic fatigue, unspecified: Secondary | ICD-10-CM | POA: Insufficient documentation

## 2017-04-23 DIAGNOSIS — R001 Bradycardia, unspecified: Secondary | ICD-10-CM | POA: Diagnosis not present

## 2017-04-23 DIAGNOSIS — I493 Ventricular premature depolarization: Secondary | ICD-10-CM | POA: Diagnosis not present

## 2017-04-23 DIAGNOSIS — R002 Palpitations: Secondary | ICD-10-CM | POA: Diagnosis not present

## 2017-04-23 DIAGNOSIS — R0602 Shortness of breath: Secondary | ICD-10-CM | POA: Diagnosis not present

## 2017-05-04 DIAGNOSIS — I493 Ventricular premature depolarization: Secondary | ICD-10-CM | POA: Diagnosis not present

## 2017-05-07 DIAGNOSIS — R002 Palpitations: Secondary | ICD-10-CM | POA: Diagnosis not present

## 2017-05-07 DIAGNOSIS — R0602 Shortness of breath: Secondary | ICD-10-CM | POA: Diagnosis not present

## 2017-05-07 DIAGNOSIS — R5382 Chronic fatigue, unspecified: Secondary | ICD-10-CM | POA: Diagnosis not present

## 2017-05-07 DIAGNOSIS — I493 Ventricular premature depolarization: Secondary | ICD-10-CM | POA: Diagnosis not present

## 2017-05-13 DIAGNOSIS — R002 Palpitations: Secondary | ICD-10-CM | POA: Diagnosis not present

## 2017-05-13 DIAGNOSIS — I493 Ventricular premature depolarization: Secondary | ICD-10-CM | POA: Diagnosis not present

## 2017-05-13 DIAGNOSIS — R5382 Chronic fatigue, unspecified: Secondary | ICD-10-CM | POA: Diagnosis not present

## 2017-05-13 DIAGNOSIS — R001 Bradycardia, unspecified: Secondary | ICD-10-CM | POA: Diagnosis not present

## 2017-05-13 DIAGNOSIS — R0602 Shortness of breath: Secondary | ICD-10-CM | POA: Diagnosis not present

## 2017-06-08 ENCOUNTER — Other Ambulatory Visit: Payer: Self-pay | Admitting: Internal Medicine

## 2017-06-08 DIAGNOSIS — Z1231 Encounter for screening mammogram for malignant neoplasm of breast: Secondary | ICD-10-CM

## 2017-06-17 ENCOUNTER — Ambulatory Visit
Admission: RE | Admit: 2017-06-17 | Discharge: 2017-06-17 | Disposition: A | Discharge status: Home / Self Care | Payer: Medicare Other | Source: Ambulatory Visit | Attending: Internal Medicine | Admitting: Internal Medicine

## 2017-06-17 DIAGNOSIS — Z1231 Encounter for screening mammogram for malignant neoplasm of breast: Secondary | ICD-10-CM | POA: Insufficient documentation

## 2017-06-18 DIAGNOSIS — H02831 Dermatochalasis of right upper eyelid: Secondary | ICD-10-CM | POA: Diagnosis not present

## 2017-06-23 DIAGNOSIS — I83893 Varicose veins of bilateral lower extremities with other complications: Secondary | ICD-10-CM | POA: Diagnosis not present

## 2017-06-28 ENCOUNTER — Other Ambulatory Visit: Payer: Self-pay | Admitting: Internal Medicine

## 2017-06-28 DIAGNOSIS — E7849 Other hyperlipidemia: Secondary | ICD-10-CM

## 2017-06-28 DIAGNOSIS — I10 Essential (primary) hypertension: Secondary | ICD-10-CM

## 2017-07-30 DIAGNOSIS — H02831 Dermatochalasis of right upper eyelid: Secondary | ICD-10-CM | POA: Diagnosis not present

## 2017-08-05 ENCOUNTER — Ambulatory Visit (INDEPENDENT_AMBULATORY_CARE_PROVIDER_SITE_OTHER): Payer: Medicare Other

## 2017-08-05 VITALS — BP 110/58 | HR 60 | Temp 98.3°F | Resp 12 | Ht 66.0 in | Wt 165.0 lb

## 2017-08-05 DIAGNOSIS — E2839 Other primary ovarian failure: Secondary | ICD-10-CM | POA: Diagnosis not present

## 2017-08-05 DIAGNOSIS — Z0000 Encounter for general adult medical examination without abnormal findings: Secondary | ICD-10-CM

## 2017-08-05 DIAGNOSIS — M8580 Other specified disorders of bone density and structure, unspecified site: Secondary | ICD-10-CM

## 2017-08-05 NOTE — Patient Instructions (Signed)
Melinda Perry , Thank you for taking time to come for your Medicare Wellness Visit. I appreciate your ongoing commitment to your health goals. Please review the following plan we discussed and let me know if I can assist you in the future.   Screening recommendations/referrals: Colorectal Screening: Up to date Mammogram: Up to date Bone Density: Ordered today  Vision and Dental Exams: Recommended annual ophthalmology exams for early detection of glaucoma and other disorders of the eye Recommended annual dental exams for proper oral hygiene  Vaccinations: Influenza vaccine: Up to date Pneumococcal vaccine: Completed series Tdap vaccine: Up to date Shingles vaccine: Please provide a copy of your vaccination record    Advanced directives: Please bring a copy of your POA (Power of [place]) and/or Living Will to your next appointment.  Conditions/risks identified: Recommend to drink at least 6-8 8oz glasses of water per day.  Next appointment: Please schedule your Annual Wellness Visit with your Nurse Health Advisor in one year.  Preventive Care 16 Years and Older, Female Preventive care refers to lifestyle choices and visits with your health care provider that can promote health and wellness. What does preventive care include?  A yearly physical exam. This is also called an annual well check.  Dental exams once or twice a year.  Routine eye exams. Ask your health care provider how often you should have your eyes checked.  Personal lifestyle choices, including:  Daily care of your teeth and gums.  Regular physical activity.  Eating a healthy diet.  Avoiding tobacco and drug use.  Limiting alcohol use.  Practicing safe sex.  Taking low-dose aspirin every day.  Taking vitamin and mineral supplements as recommended by your health care provider. What happens during an annual well check? The services and screenings done by your health care provider during your annual well  check will depend on your age, overall health, lifestyle risk factors, and family history of disease. Counseling  Your health care provider may ask you questions about your:  Alcohol use.  Tobacco use.  Drug use.  Emotional well-being.  Home and relationship well-being.  Sexual activity.  Eating habits.  History of falls.  Memory and ability to understand (cognition).  Work and work Statistician.  Reproductive health. Screening  You may have the following tests or measurements:  Height, weight, and BMI.  Blood pressure.  Lipid and cholesterol levels. These may be checked every 5 years, or more frequently if you are over 71 years old.  Skin check.  Lung cancer screening. You may have this screening every year starting at age 22 if you have a 30-pack-year history of smoking and currently smoke or have quit within the past 15 years.  Fecal occult blood test (FOBT) of the stool. You may have this test every year starting at age 47.  Flexible sigmoidoscopy or colonoscopy. You may have a sigmoidoscopy every 5 years or a colonoscopy every 10 years starting at age 7.  Hepatitis C blood test.  Hepatitis B blood test.  Sexually transmitted disease (STD) testing.  Diabetes screening. This is done by checking your blood sugar (glucose) after you have not eaten for a while (fasting). You may have this done every 1-3 years.  Bone density scan. This is done to screen for osteoporosis. You may have this done starting at age 43.  Mammogram. This may be done every 1-2 years. Talk to your health care provider about how often you should have regular mammograms. Talk with your health care provider about  your test results, treatment options, and if necessary, the need for more tests. Vaccines  Your health care provider may recommend certain vaccines, such as:  Influenza vaccine. This is recommended every year.  Tetanus, diphtheria, and acellular pertussis (Tdap, Td) vaccine. You  may need a Td booster every 10 years.  Zoster vaccine. You may need this after age 59.  Pneumococcal 13-valent conjugate (PCV13) vaccine. One dose is recommended after age 66.  Pneumococcal polysaccharide (PPSV23) vaccine. One dose is recommended after age 32. Talk to your health care provider about which screenings and vaccines you need and how often you need them. This information is not intended to replace advice given to you by your health care provider. Make sure you discuss any questions you have with your health care provider. Document Released: 04/13/2015 Document Revised: 12/05/2015 Document Reviewed: 01/16/2015 Elsevier Interactive Patient Education  2017 [place] Prevention in the Home Falls can cause injuries. They can happen to people of all ages. There are many things you can do to make your home safe and to help prevent falls. What can I do on the outside of my home?  Regularly fix the edges of walkways and driveways and fix any cracks.  Remove anything that might make you trip as you walk through a door, such as a raised step or threshold.  Trim any bushes or trees on the path to your home.  Use bright outdoor lighting.  Clear any walking paths of anything that might make someone trip, such as rocks or tools.  Regularly check to see if handrails are loose or broken. Make sure that both sides of any steps have handrails.  Any raised decks and porches should have guardrails on the edges.  Have any leaves, snow, or ice cleared regularly.  Use sand or salt on walking paths during winter.  Clean up any spills in your garage right away. This includes oil or grease spills. What can I do in the bathroom?  Use night lights.  Install grab bars by the toilet and in the tub and shower. Do not use towel bars as grab bars.  Use non-skid mats or decals in the tub or shower.  If you need to sit down in the shower, use a plastic, non-slip stool.  Keep the floor  dry. Clean up any water that spills on the floor as soon as it happens.  Remove soap buildup in the tub or shower regularly.  Attach bath mats securely with double-sided non-slip rug tape.  Do not have throw rugs and other things on the floor that can make you trip. What can I do in the bedroom?  Use night lights.  Make sure that you have a light by your bed that is easy to reach.  Do not use any sheets or blankets that are too big for your bed. They should not hang down onto the floor.  Have a firm chair that has side arms. You can use this for support while you get dressed.  Do not have throw rugs and other things on the floor that can make you trip. What can I do in the kitchen?  Clean up any spills right away.  Avoid walking on wet floors.  Keep items that you use a lot in easy-to-reach places.  If you need to reach something above you, use a strong step stool that has a grab bar.  Keep electrical cords out of the way.  Do not use floor polish or wax  that makes floors slippery. If you must use wax, use non-skid floor wax.  Do not have throw rugs and other things on the floor that can make you trip. What can I do with my stairs?  Do not leave any items on the stairs.  Make sure that there are handrails on both sides of the stairs and use them. Fix handrails that are broken or loose. Make sure that handrails are as long as the stairways.  Check any carpeting to make sure that it is firmly attached to the stairs. Fix any carpet that is loose or worn.  Avoid having throw rugs at the top or bottom of the stairs. If you do have throw rugs, attach them to the floor with carpet tape.  Make sure that you have a light switch at the top of the stairs and the bottom of the stairs. If you do not have them, ask someone to add them for you. What else can I do to help prevent falls?  Wear shoes that:  Do not have high heels.  Have rubber bottoms.  Are comfortable and fit you  well.  Are closed at the toe. Do not wear sandals.  If you use a stepladder:  Make sure that it is fully opened. Do not climb a closed stepladder.  Make sure that both sides of the stepladder are locked into place.  Ask someone to hold it for you, if possible.  Clearly mark and make sure that you can see:  Any grab bars or handrails.  First and last steps.  Where the edge of each step is.  Use tools that help you move around (mobility aids) if they are needed. These include:  Canes.  Walkers.  Scooters.  Crutches.  Turn on the lights when you go into a dark area. Replace any light bulbs as soon as they burn out.  Set up your furniture so you have a clear path. Avoid moving your furniture around.  If any of your floors are uneven, fix them.  If there are any pets around you, be aware of where they are.  Review your medicines with your doctor. Some medicines can make you feel dizzy. This can increase your chance of falling. Ask your doctor what other things that you can do to help prevent falls. This information is not intended to replace advice given to you by your health care provider. Make sure you discuss any questions you have with your health care provider. Document Released: 01/11/2009 Document Revised: 08/23/2015 Document Reviewed: 04/21/2014 Elsevier Interactive Patient Education  2017 [employer].

## 2017-08-05 NOTE — Progress Notes (Signed)
Subjective:   Melinda Perry is a 72 y.o. female who presents for Medicare Annual (Subsequent) preventive examination.  Review of Systems:  N/A Cardiac Risk Factors include: advanced age (>55men, >41 women);hypertension;smoking/ tobacco exposure;sedentary lifestyle     Objective:     Vitals: BP (!) 110/58 (BP Location: Left Arm, Patient Position: Sitting, Cuff Size: Normal)   Pulse 60   Temp 98.3 F (36.8 C) (Oral)   Resp 12   Ht 5\' 6"  (1.676 m)   Wt 165 lb (74.8 kg)   SpO2 96%   BMI 26.63 kg/m   Body mass index is 26.63 kg/m.  Advanced Directives 08/05/2017 01/26/2017 07/23/2016 05/10/2015 04/11/2015  Does Patient Have a Medical Advance Directive? Yes No Yes Yes Yes  Type of Paramedic of [place];Living will - Healthcare Power of [place];Living will [place];Living will  Copy of [place] in Chart? No - copy requested - No - copy requested - -    Tobacco Social History   Tobacco Use  Smoking Status Current Every Day Smoker  . Packs/day: 0.50  . Years: 60.00  . Pack years: 30.00  . Types: Cigarettes  Smokeless Tobacco Never Used     Ready to quit: No Counseling given: Yes  Clinical Intake:  Pre-visit preparation completed: Yes  Pain : No/denies pain   BMI - recorded: 26.63 Nutritional Status: BMI 25 -29 Overweight Nutritional Risks: None Diabetes: No  How often do you need to have someone help you when you read instructions, pamphlets, or other written materials from your doctor or pharmacy?: 1 - Never  Interpreter Needed?: No  Information entered by :: AEversole, LPN  Past Medical History:  Diagnosis Date  . High cholesterol   . PVC (premature ventricular contraction)    Past Surgical History:  Procedure Laterality Date  . AUGMENTATION MAMMAPLASTY Bilateral 1988  . BREAST ENHANCEMENT SURGERY    . BUNIONECTOMY Bilateral   . CERVICAL CONE BIOPSY  1971    LSIL  . facet shots in back  05/2016   [facility]-   . TUBAL LIGATION     Family History  Problem Relation Age of Onset  . Lung cancer Mother   . Alcohol abuse Mother   . Pulmonary fibrosis Father   . Alcohol abuse Sister   . Breast cancer Neg Hx    Social History   Socioeconomic History  . Marital status: Divorced    Spouse name: Not on file  . Number of children: 2  . Years of education: some college  . Highest education level: 12th grade  Occupational History  . Occupation: Retired  Scientific laboratory technician  . Financial resource strain: Not hard at all  . Food insecurity:    Worry: Never true    Inability: Never true  . Transportation needs:    Medical: No    Non-medical: No  Tobacco Use  . Smoking status: Current Every Day Smoker    Packs/day: 0.50    Years: 60.00    Pack years: 30.00    Types: Cigarettes  . Smokeless tobacco: Never Used  Substance and Sexual Activity  . Alcohol use: Yes    Comment: socially  . Drug use: No  . Sexual activity: Not Currently  Lifestyle  . Physical activity:    Days per week: 0 days    Minutes per session: 0 min  . Stress: Not at all  Relationships  . Social connections:  Talks on phone: Patient refused    Gets together: Patient refused    Attends religious service: Patient refused    Active member of club or organization: Patient refused    Attends meetings of clubs or organizations: Patient refused    Relationship status: Divorced  Other Topics Concern  . Not on file  Social History Narrative  . Not on file    Outpatient Encounter Medications as of 08/05/2017  Medication Sig  . atorvastatin (LIPITOR) 10 MG tablet TAKE 1 TABLET(10 MG) BY MOUTH DAILY AT 6 PM  . losartan (COZAAR) 25 MG tablet TAKE 1 TABLET(25 MG) BY MOUTH DAILY  . metoprolol succinate (TOPROL-XL) 25 MG 24 hr tablet Take 25 mg by mouth daily. Take 1/2 tablet daily  . [DISCONTINUED] ciclopirox (PENLAC) 8 % solution APP AA ON AND AROUND NAIL ATN  .  [DISCONTINUED] clobetasol (TEMOVATE) 0.05 % external solution   . [DISCONTINUED] diltiazem (DILACOR XR) 120 MG 24 hr capsule Take 1 capsule (120 mg total) by mouth daily.   No facility-administered encounter medications on file as of 08/05/2017.     Activities of Daily Living In your present state of health, do you have any difficulty performing the following activities: 08/05/2017  Hearing? N  Comment denies hearing aids  Vision? N  Comment R eyelid visual obstruction, wears reading eye glasses  Difficulty concentrating or making decisions? N  Walking or climbing stairs? N  Dressing or bathing? N  Doing errands, shopping? N  Preparing Food and eating ? N  Comment denies dentures  Using the Toilet? N  In the past six months, have you accidently leaked urine? N  Do you have problems with loss of bowel control? N  Managing your Medications? N  Managing your Finances? N  Housekeeping or managing your Housekeeping? N  Some recent data might be hidden    Patient Care Team: Glean Hess, MD as PCP - General (Family Medicine) Isaias Cowman, MD as Consulting Physician (Cardiology) Dasher, Rayvon Char, MD as Consulting Physician (Dermatology)    Assessment:   This is a routine wellness examination for Melinda Perry.  Exercise Activities and Dietary recommendations Current Exercise Habits: The patient does not participate in regular exercise at present, Exercise limited by: None identified  Goals    . DIET - INCREASE WATER INTAKE     Recommend to drink at least 6-8 8oz glasses of water per day.    . Weight < 200 lb (90.719 kg)       Fall Risk Fall Risk  08/05/2017 07/23/2016 04/11/2015  Falls in the past year? No No No  Risk for fall due to : Impaired vision - -  Risk for fall due to: Comment R eyelid obstructing visual field - -   FALL RISK PREVENTION PERTAINING TO HOME: Is your home free of loose throw rugs in walkways, pet beds, electrical cords, etc? Yes Is there adequate  lighting in your home to reduce risk of falls?  Yes Are there stairs in or around your home WITH handrails? Yes  ASSISTIVE DEVICES UTILIZED TO PREVENT FALLS: Use of a cane, walker or w/c? No Grab bars in the bathroom? Yes  Shower chair or a place to sit while bathing? Yes An elevated toilet seat or a handicapped toilet? No  Timed Get Up and Go Performed: Yes. Pt ambulated 10 feet within 7 sec. Gait stead-fast and without the use of an assistive device. No intervention required at this time. Fall risk prevention has been discussed.  Community Resource Referral:  Pt declined my offer to send [employer] Referral to Care Guide for an elevated toilet seat.  Depression Screen PHQ 2/9 Scores 08/05/2017 07/23/2016 04/11/2015 04/11/2015  PHQ - 2 Score 0 0 0 3  PHQ- 9 Score - - - 3     Cognitive Function     6CIT Screen 08/05/2017 07/23/2016  What Year? 0 points 0 points  What month? 0 points 0 points  What time? 0 points 0 points  Count back from 20 0 points 0 points  Months in reverse 0 points 0 points  Repeat phrase 0 points 0 points  Total Score 0 0    Immunization History  Administered Date(s) Administered  . Influenza, High Dose Seasonal PF 01/06/2017  . Influenza-Unspecified 12/30/2014, 01/13/2016  . Pneumococcal Conjugate-13 11/16/2013  . Pneumococcal Polysaccharide-23 04/01/2010  . Tdap 04/01/2010  . Zoster 04/01/2010    Qualifies for Shingles Vaccine? Pt states she has completed the Shingrix series. Advised she provide a copy of her vaccination record. Verbalized acceptance and understanding.  Screening Tests Health Maintenance  Topic Date Due  . INFLUENZA VACCINE  12/29/2017 (Originally 10/29/2017)  . MAMMOGRAM  06/18/2018  . TETANUS/TDAP  04/01/2020  . COLONOSCOPY  12/03/2022  . PNA vac Low Risk Adult  Completed  . DEXA SCAN  Addressed  . Hepatitis C Screening  Addressed    Cancer Screenings: Lung: Low Dose CT Chest recommended if Age 45-80 years, 30  pack-year currently smoking OR have quit w/in 15years. Patient does qualify. Pt declined offer to be referred to Burgess Estelle, RN for lung cancer screening. Breast:  Up to date on Mammogram? Yes. Completed 06/17/17. Repeat every year.   Up to date of Bone Density/Dexa? No. Ordered today. Pt is aware that she will receive a call from our office re: her appt. Message sent to referral coordinator for scheduling purposes. Colorectal: Completed 12/02/12. Repeat every 10 years  Additional Screenings: Hepatitis C Screening: Completed 04/01/13    Plan:  I have personally reviewed and addressed the Medicare Annual Wellness questionnaire and have noted the following in the patient's chart:  A. Medical and social history B. Use of alcohol, tobacco or illicit drugs  C. Current medications and supplements D. Functional ability and status E.  Nutritional status F.  Physical activity G. Advance directives H. List of other physicians I.  Hospitalizations, surgeries, and ER visits in previous 12 months J.  [place] such as hearing and vision if needed, cognitive and depression L. Referrals and appointments  In addition, I have reviewed and discussed with patient certain preventive protocols, quality metrics, and best practice recommendations. A written personalized care plan for preventive services as well as general preventive health recommendations were provided to patient.  Signed,  Aleatha Borer, LPN Nurse Health Advisor  MD Recommendations: Shingrix: Pt states she has completed the Shingrix series. Advised she provide a copy of her vaccination record. Verbalized acceptance and understanding.  Bone Density/Dexa: Ordered today. Pt is aware that she will receive a call from our office re: her appt. Message sent to referral coordinator for scheduling purposes.

## 2017-08-06 DIAGNOSIS — M545 Low back pain: Secondary | ICD-10-CM | POA: Diagnosis not present

## 2017-08-06 DIAGNOSIS — M48062 Spinal stenosis, lumbar region with neurogenic claudication: Secondary | ICD-10-CM | POA: Diagnosis not present

## 2017-08-11 ENCOUNTER — Ambulatory Visit
Admission: RE | Admit: 2017-08-11 | Discharge: 2017-08-11 | Disposition: A | Discharge status: Home / Self Care | Payer: Medicare Other | Source: Ambulatory Visit | Attending: Internal Medicine | Admitting: Internal Medicine

## 2017-08-11 DIAGNOSIS — M8589 Other specified disorders of bone density and structure, multiple sites: Secondary | ICD-10-CM | POA: Diagnosis not present

## 2017-08-11 DIAGNOSIS — Z780 Asymptomatic menopausal state: Secondary | ICD-10-CM | POA: Diagnosis not present

## 2017-08-11 DIAGNOSIS — E2839 Other primary ovarian failure: Secondary | ICD-10-CM | POA: Diagnosis not present

## 2017-08-11 DIAGNOSIS — M8580 Other specified disorders of bone density and structure, unspecified site: Secondary | ICD-10-CM

## 2017-08-12 DIAGNOSIS — M47817 Spondylosis without myelopathy or radiculopathy, lumbosacral region: Secondary | ICD-10-CM | POA: Diagnosis not present

## 2017-08-13 ENCOUNTER — Ambulatory Visit (INDEPENDENT_AMBULATORY_CARE_PROVIDER_SITE_OTHER): Payer: Medicare Other | Admitting: Internal Medicine

## 2017-08-13 ENCOUNTER — Encounter: Payer: Self-pay | Admitting: Internal Medicine

## 2017-08-13 ENCOUNTER — Ambulatory Visit: Payer: Medicare Other

## 2017-08-13 VITALS — BP 119/76 | HR 62 | Resp 16 | Ht 66.0 in | Wt 164.3 lb

## 2017-08-13 DIAGNOSIS — M8580 Other specified disorders of bone density and structure, unspecified site: Secondary | ICD-10-CM | POA: Insufficient documentation

## 2017-08-13 DIAGNOSIS — F17200 Nicotine dependence, unspecified, uncomplicated: Secondary | ICD-10-CM

## 2017-08-13 DIAGNOSIS — Z86010 Personal history of colonic polyps: Secondary | ICD-10-CM | POA: Diagnosis not present

## 2017-08-13 DIAGNOSIS — E7849 Other hyperlipidemia: Secondary | ICD-10-CM | POA: Diagnosis not present

## 2017-08-13 DIAGNOSIS — I10 Essential (primary) hypertension: Secondary | ICD-10-CM

## 2017-08-13 LAB — POCT URINALYSIS DIPSTICK
BILIRUBIN UA: NEGATIVE
Glucose, UA: NEGATIVE
Ketones, UA: NEGATIVE
Nitrite, UA: NEGATIVE
PH UA: 6.5 (ref 5.0–8.0)
Protein, UA: NEGATIVE
Spec Grav, UA: 1.015 (ref 1.010–1.025)
UROBILINOGEN UA: 0.2 U/dL

## 2017-08-13 NOTE — Patient Instructions (Signed)
Health Maintenance for Postmenopausal Women Menopause is a normal process in which your reproductive ability comes to an end. This process happens gradually over a span of months to years, usually between the ages of 22 and 9. Menopause is complete when you have missed 12 consecutive menstrual periods. It is important to talk with your health care provider about some of the most common conditions that affect postmenopausal women, such as heart disease, cancer, and bone loss (osteoporosis). Adopting a healthy lifestyle and getting preventive care can help to promote your health and wellness. Those actions can also lower your chances of developing some of these common conditions. What should I know about menopause? During menopause, you may experience a number of symptoms, such as:  Moderate-to-severe hot flashes.  Night sweats.  Decrease in sex drive.  Mood swings.  Headaches.  Tiredness.  Irritability.  Memory problems.  Insomnia.  Choosing to treat or not to treat menopausal changes is an individual decision that you make with your health care provider. What should I know about hormone replacement therapy and supplements? Hormone therapy products are effective for treating symptoms that are associated with menopause, such as hot flashes and night sweats. Hormone replacement carries certain risks, especially as you become older. If you are thinking about using estrogen or estrogen with progestin treatments, discuss the benefits and risks with your health care provider. What should I know about heart disease and stroke? Heart disease, heart attack, and stroke become more likely as you age. This may be due, in part, to the hormonal changes that your body experiences during menopause. These can affect how your body processes dietary fats, triglycerides, and cholesterol. Heart attack and stroke are both medical emergencies. There are many things that you can do to help prevent heart disease  and stroke:  Have your blood pressure checked at least every 1-2 years. High blood pressure causes heart disease and increases the risk of stroke.  If you are 53-22 years old, ask your health care provider if you should take aspirin to prevent a heart attack or a stroke.  Do not use any tobacco products, including cigarettes, chewing tobacco, or electronic cigarettes. If you need help quitting, ask your health care provider.  It is important to eat a healthy diet and maintain a healthy weight. ? Be sure to include plenty of vegetables, fruits, low-fat dairy products, and lean protein. ? Avoid eating foods that are high in solid fats, added sugars, or salt (sodium).  Get regular exercise. This is one of the most important things that you can do for your health. ? Try to exercise for at least 150 minutes each week. The type of exercise that you do should increase your heart rate and make you sweat. This is known as moderate-intensity exercise. ? Try to do strengthening exercises at least twice each week. Do these in addition to the moderate-intensity exercise.  Know your numbers.Ask your health care provider to check your cholesterol and your blood glucose. Continue to have your blood tested as directed by your health care provider.  What should I know about cancer screening? There are several types of cancer. Take the following steps to reduce your risk and to catch any cancer development as early as possible. Breast Cancer  Practice breast self-awareness. ? This means understanding how your breasts normally appear and feel. ? It also means doing regular breast self-exams. Let your health care provider know about any changes, no matter how small.  If you are 40  or older, have a clinician do a breast exam (clinical breast exam or CBE) every year. Depending on your age, family history, and medical history, it may be recommended that you also have a yearly breast X-ray (mammogram).  If you  have a family history of breast cancer, talk with your health care provider about genetic screening.  If you are at high risk for breast cancer, talk with your health care provider about having an MRI and a mammogram every year.  Breast cancer (BRCA) gene test is recommended for women who have family members with BRCA-related cancers. Results of the assessment will determine the need for genetic counseling and BRCA1 and for BRCA2 testing. BRCA-related cancers include these types: ? Breast. This occurs in males or females. ? Ovarian. ? Tubal. This may also be called fallopian tube cancer. ? Cancer of the abdominal or pelvic lining (peritoneal cancer). ? Prostate. ? Pancreatic.  Cervical, Uterine, and Ovarian Cancer Your health care provider may recommend that you be screened regularly for cancer of the pelvic organs. These include your ovaries, uterus, and vagina. This screening involves a pelvic exam, which includes checking for microscopic changes to the surface of your cervix (Pap test).  For women ages 21-65, health care providers may recommend a pelvic exam and a Pap test every three years. For women ages 79-65, they may recommend the Pap test and pelvic exam, combined with testing for human papilloma virus (HPV), every five years. Some types of HPV increase your risk of cervical cancer. Testing for HPV may also be done on women of any age who have unclear Pap test results.  Other health care providers may not recommend any screening for nonpregnant women who are considered low risk for pelvic cancer and have no symptoms. Ask your health care provider if a screening pelvic exam is right for you.  If you have had past treatment for cervical cancer or a condition that could lead to cancer, you need Pap tests and screening for cancer for at least 20 years after your treatment. If Pap tests have been discontinued for you, your risk factors (such as having a new sexual partner) need to be  reassessed to determine if you should start having screenings again. Some women have medical problems that increase the chance of getting cervical cancer. In these cases, your health care provider may recommend that you have screening and Pap tests more often.  If you have a family history of uterine cancer or ovarian cancer, talk with your health care provider about genetic screening.  If you have vaginal bleeding after reaching menopause, tell your health care provider.  There are currently no reliable tests available to screen for ovarian cancer.  Lung Cancer Lung cancer screening is recommended for adults 69-62 years old who are at high risk for lung cancer because of a history of smoking. A yearly low-dose CT scan of the lungs is recommended if you:  Currently smoke.  Have a history of at least 30 pack-years of smoking and you currently smoke or have quit within the past 15 years. A pack-year is smoking an average of one pack of cigarettes per day for one year.  Yearly screening should:  Continue until it has been 15 years since you quit.  Stop if you develop a health problem that would prevent you from having lung cancer treatment.  Colorectal Cancer  This type of cancer can be detected and can often be prevented.  Routine colorectal cancer screening usually begins at  age 42 and continues through age 45.  If you have risk factors for colon cancer, your health care provider may recommend that you be screened at an earlier age.  If you have a family history of colorectal cancer, talk with your health care provider about genetic screening.  Your health care provider may also recommend using home test kits to check for hidden blood in your stool.  A small camera at the end of a tube can be used to examine your colon directly (sigmoidoscopy or colonoscopy). This is done to check for the earliest forms of colorectal cancer.  Direct examination of the colon should be repeated every  5-10 years until age 71. However, if early forms of precancerous polyps or small growths are found or if you have a family history or genetic risk for colorectal cancer, you may need to be screened more often.  Skin Cancer  Check your skin from head to toe regularly.  Monitor any moles. Be sure to tell your health care provider: ? About any new moles or changes in moles, especially if there is a change in a mole's shape or color. ? If you have a mole that is larger than the size of a pencil eraser.  If any of your family members has a history of skin cancer, especially at a young age, talk with your health care provider about genetic screening.  Always use sunscreen. Apply sunscreen liberally and repeatedly throughout the day.  Whenever you are outside, protect yourself by wearing long sleeves, pants, a wide-brimmed hat, and sunglasses.  What should I know about osteoporosis? Osteoporosis is a condition in which bone destruction happens more quickly than new bone creation. After menopause, you may be at an increased risk for osteoporosis. To help prevent osteoporosis or the bone fractures that can happen because of osteoporosis, the following is recommended:  If you are 46-71 years old, get at least 1,000 mg of calcium and at least 600 mg of vitamin D per day.  If you are older than age 55 but younger than age 65, get at least 1,200 mg of calcium and at least 600 mg of vitamin D per day.  If you are older than age 54, get at least 1,200 mg of calcium and at least 800 mg of vitamin D per day.  Smoking and excessive alcohol intake increase the risk of osteoporosis. Eat foods that are rich in calcium and vitamin D, and do weight-bearing exercises several times each week as directed by your health care provider. What should I know about how menopause affects my mental health? Depression may occur at any age, but it is more common as you become older. Common symptoms of depression  include:  Low or sad mood.  Changes in sleep patterns.  Changes in appetite or eating patterns.  Feeling an overall lack of motivation or enjoyment of activities that you previously enjoyed.  Frequent crying spells.  Talk with your health care provider if you think that you are experiencing depression. What should I know about immunizations? It is important that you get and maintain your immunizations. These include:  Tetanus, diphtheria, and pertussis (Tdap) booster vaccine.  Influenza every year before the flu season begins.  Pneumonia vaccine.  Shingles vaccine.  Your health care provider may also recommend other immunizations. This information is not intended to replace advice given to you by your health care provider. Make sure you discuss any questions you have with your health care provider. Document Released: 05/09/2005  Document Revised: 10/05/2015 Document Reviewed: 12/19/2014 Elsevier Interactive Patient Education  2018 [employer]  

## 2017-08-13 NOTE — Progress Notes (Signed)
Date:  08/13/2017   Name:  Melinda Perry [facility]   DOB:  [birth year]   MRN:  [id number]  Pt recently had MAW.  Shingrix Rx given.  Mammogram was done in March,  Dexa 2 days ago showed osteopenia.  Chief Complaint: Hypertension and Hyperlipidemia Hypertension  This is a chronic problem. The problem is unchanged. The problem is controlled. Pertinent negatives include no chest pain, headaches, palpitations or shortness of breath. Past treatments include angiotensin blockers and beta blockers. The current treatment provides significant improvement.  Hyperlipidemia  This is a chronic problem. The problem is controlled. Pertinent negatives include no chest pain or shortness of breath. Current antihyperlipidemic treatment includes statins. The current treatment provides significant improvement of lipids.  Back Pain  This is a recurrent problem. Pertinent negatives include no abdominal pain, chest pain, dysuria, fever or headaches.  Tobacco use - she is still smoking.  She is interested in the low dose CT screening.    Review of Systems  Constitutional: Negative for chills, fatigue and fever.  HENT: Negative for congestion, hearing loss, tinnitus, trouble swallowing and voice change.   Eyes: Negative for visual disturbance.  Respiratory: Negative for cough, chest tightness, shortness of breath and wheezing.   Cardiovascular: Negative for chest pain, palpitations and leg swelling.  Gastrointestinal: Negative for abdominal pain, constipation, diarrhea and vomiting.  Endocrine: Negative for polydipsia and polyuria.  Genitourinary: Negative for dysuria, frequency, genital sores, vaginal bleeding and vaginal discharge.  Musculoskeletal: Positive for back pain. Negative for arthralgias, gait problem and joint swelling.  Skin: Negative for color change and rash.  Neurological: Negative for dizziness, tremors, light-headedness and headaches.  Hematological: Negative for adenopathy. Does not  bruise/bleed easily.  Psychiatric/Behavioral: Negative for dysphoric mood and sleep disturbance. The patient is not nervous/anxious.     Patient Active Problem List   Diagnosis Date Noted  . Alopecia areata 07/23/2016  . Heart palpitations 07/23/2016  . Ankle pain 04/11/2015  . Xerosis cutis 04/11/2015  . Familial multiple lipoprotein-type hyperlipidemia 03/09/2015  . Tobacco use disorder 03/09/2015  . Wedging of vertebra ([place]) 03/09/2015  . History of colon polyps 03/09/2015  . Benign hypertension 03/09/2015  . Microscopic hematuria 03/09/2015    Prior to Admission medications   Medication Sig Start Date End Date Taking? Authorizing Provider  atorvastatin (LIPITOR) 10 MG tablet TAKE 1 TABLET(10 MG) BY MOUTH DAILY AT 6 PM 06/28/17  Yes Glean Hess, MD  losartan (COZAAR) 25 MG tablet TAKE 1 TABLET(25 MG) BY MOUTH DAILY 06/28/17  Yes Glean Hess, MD  metoprolol succinate (TOPROL-XL) 25 MG 24 hr tablet Take 25 mg by mouth daily. Take 1/2 tablet daily 05/13/17  Yes [provider]    Allergies  Allergen Reactions  . Bupropion Other (See Comments)    Hair loss    Past Surgical History:  Procedure Laterality Date  . AUGMENTATION MAMMAPLASTY Bilateral 1988  . BREAST ENHANCEMENT SURGERY    . BUNIONECTOMY Bilateral   . CERVICAL CONE BIOPSY  1971   LSIL  . facet shots in back  05/2016   [facility]-   . TUBAL LIGATION      Social History   Tobacco Use  . Smoking status: Current Every Day Smoker    Packs/day: 0.50    Years: 60.00    Pack years: 30.00    Types: Cigarettes  . Smokeless tobacco: Never Used  Substance Use Topics  . Alcohol use: Yes    Comment: socially  . Drug use:  No     Medication list has been reviewed and updated.  PHQ 2/9 Scores 08/05/2017 07/23/2016 04/11/2015 04/11/2015  PHQ - 2 Score 0 0 0 3  PHQ- 9 Score - - - 3    Physical Exam  Constitutional: She is oriented to person, place, and time. She appears well-developed and  well-nourished. No distress.  HENT:  Head: Normocephalic and atraumatic.  Right Ear: Tympanic membrane and ear canal normal.  Left Ear: Tympanic membrane and ear canal normal.  Nose: Right sinus exhibits no maxillary sinus tenderness. Left sinus exhibits no maxillary sinus tenderness.  Mouth/Throat: Uvula is midline and oropharynx is clear and moist.  Eyes: Conjunctivae and EOM are normal. Right eye exhibits no discharge. Left eye exhibits no discharge. No scleral icterus.  Neck: Normal range of motion. Carotid bruit is not present. No erythema present. No thyromegaly present.  Cardiovascular: Normal rate, regular rhythm, normal heart sounds and normal pulses.  Pulmonary/Chest: Effort normal. No respiratory distress. She has no wheezes. Right breast exhibits no mass, no nipple discharge, no skin change and no tenderness. Left breast exhibits no mass, no nipple discharge, no skin change and no tenderness.  Abdominal: Soft. Bowel sounds are normal. There is no hepatosplenomegaly. There is no tenderness. There is no CVA tenderness.  Musculoskeletal: Normal range of motion.  Lymphadenopathy:    She has no cervical adenopathy.    She has no axillary adenopathy.  Neurological: She is alert and oriented to person, place, and time. She has normal reflexes. No cranial nerve deficit or sensory deficit.  Skin: Skin is warm, dry and intact. No rash noted.  Psychiatric: She has a normal mood and affect. Her speech is normal and behavior is normal. Thought content normal.  Nursing note and vitals reviewed.   BP 119/76   Pulse 62   Resp 16   Ht 5\' 6"  (1.676 m)   Wt 164 lb 4.8 oz (74.5 kg)   SpO2 99%   BMI 26.52 kg/m   Assessment and Plan: 1. Benign hypertension controlled - CBC with Differential/Platelet - Comprehensive metabolic panel - TSH - POCT urinalysis dipstick  2. Familial multiple lipoprotein-type hyperlipidemia On statin therapy with no side effects - Lipid panel  3. History of  colon polyps Due for repeat in 2024  4. Tobacco use disorder Will refer for low dose screening CT lungs  5. Osteopenia determined by x-ray Begin Calcium and vitamin D Weight bearing exercise   No orders of the defined types were placed in this encounter.   Partially dictated using Editor, commissioning. Any errors are unintentional.  Halina Maidens, MD [facility]  08/13/2017

## 2017-08-14 ENCOUNTER — Telehealth: Payer: Self-pay | Admitting: *Deleted

## 2017-08-14 DIAGNOSIS — Z87891 Personal history of nicotine dependence: Secondary | ICD-10-CM

## 2017-08-14 DIAGNOSIS — Z122 Encounter for screening for malignant neoplasm of respiratory organs: Secondary | ICD-10-CM

## 2017-08-14 LAB — CBC WITH DIFFERENTIAL/PLATELET
Basophils Absolute: 0 10*3/uL (ref 0.0–0.2)
Basos: 0 %
EOS (ABSOLUTE): 0 10*3/uL (ref 0.0–0.4)
Eos: 0 %
HEMATOCRIT: 47.6 % — AB (ref 34.0–46.6)
Hemoglobin: 16.1 g/dL — ABNORMAL HIGH (ref 11.1–15.9)
IMMATURE GRANULOCYTES: 0 %
Immature Grans (Abs): 0 10*3/uL (ref 0.0–0.1)
Lymphocytes Absolute: 2.4 10*3/uL (ref 0.7–3.1)
Lymphs: 21 %
MCH: 30.8 pg (ref 26.6–33.0)
MCHC: 33.8 g/dL (ref 31.5–35.7)
MCV: 91 fL (ref 79–97)
MONOCYTES: 7 %
Monocytes Absolute: 0.8 10*3/uL (ref 0.1–0.9)
Neutrophils Absolute: 7.9 10*3/uL — ABNORMAL HIGH (ref 1.4–7.0)
Neutrophils: 72 %
PLATELETS: 231 10*3/uL (ref 150–379)
RBC: 5.23 x10E6/uL (ref 3.77–5.28)
RDW: 14 % (ref 12.3–15.4)
WBC: 11.1 10*3/uL — AB (ref 3.4–10.8)

## 2017-08-14 LAB — COMPREHENSIVE METABOLIC PANEL
ALT: 18 IU/L (ref 0–32)
AST: 18 IU/L (ref 0–40)
Albumin/Globulin Ratio: 1.6 (ref 1.2–2.2)
Albumin: 4.5 g/dL (ref 3.5–4.8)
Alkaline Phosphatase: 78 IU/L (ref 39–117)
BUN/Creatinine Ratio: 17 (ref 12–28)
BUN: 13 mg/dL (ref 8–27)
Bilirubin Total: 0.5 mg/dL (ref 0.0–1.2)
CALCIUM: 9.9 mg/dL (ref 8.7–10.3)
CO2: 23 mmol/L (ref 20–29)
CREATININE: 0.75 mg/dL (ref 0.57–1.00)
Chloride: 102 mmol/L (ref 96–106)
GFR, EST AFRICAN AMERICAN: 91 mL/min/{1.73_m2} (ref >59)
GFR, EST NON AFRICAN AMERICAN: 79 mL/min/{1.73_m2} (ref >59)
GLOBULIN, TOTAL: 2.9 g/dL (ref 1.5–4.5)
Glucose: 79 mg/dL (ref 65–99)
Potassium: 4.6 mmol/L (ref 3.5–5.2)
Sodium: 139 mmol/L (ref 134–144)
Total Protein: 7.4 g/dL (ref 6.0–8.5)

## 2017-08-14 LAB — LIPID PANEL
CHOL/HDL RATIO: 2.7 ratio (ref 0.0–4.4)
Cholesterol, Total: 141 mg/dL (ref 100–199)
HDL: 53 mg/dL (ref >39)
LDL CALC: 71 mg/dL (ref 0–99)
TRIGLYCERIDES: 83 mg/dL (ref 0–149)
VLDL Cholesterol Cal: 17 mg/dL (ref 5–40)

## 2017-08-14 LAB — TSH: TSH: 2.52 u[IU]/mL (ref 0.450–4.500)

## 2017-08-14 NOTE — Telephone Encounter (Signed)
Received referral for initial lung cancer screening scan. Contacted patient and obtained smoking history,(current, 45 pack year) as well as answering questions related to screening process. Patient denies signs of lung cancer such as weight loss or hemoptysis. Patient denies comorbidity that would prevent curative treatment if lung cancer were found. Patient is scheduled for shared decision making visit and CT scan on 09/03/17.

## 2017-08-27 DIAGNOSIS — H02831 Dermatochalasis of right upper eyelid: Secondary | ICD-10-CM | POA: Diagnosis not present

## 2017-08-27 DIAGNOSIS — H02834 Dermatochalasis of left upper eyelid: Secondary | ICD-10-CM | POA: Diagnosis not present

## 2017-09-03 ENCOUNTER — Ambulatory Visit
Admission: RE | Admit: 2017-09-03 | Discharge: 2017-09-03 | Disposition: A | Discharge status: Home / Self Care | Payer: Medicare Other | Source: Ambulatory Visit | Attending: Nurse Practitioner | Admitting: Nurse Practitioner

## 2017-09-03 ENCOUNTER — Inpatient Hospital Stay: Payer: Medicare Other | Attending: Nurse Practitioner | Admitting: Nurse Practitioner

## 2017-09-03 ENCOUNTER — Encounter: Payer: Self-pay | Admitting: Nurse Practitioner

## 2017-09-03 DIAGNOSIS — I700 Atherosclerosis of aorta: Secondary | ICD-10-CM | POA: Insufficient documentation

## 2017-09-03 DIAGNOSIS — J438 Other emphysema: Secondary | ICD-10-CM | POA: Diagnosis not present

## 2017-09-03 DIAGNOSIS — J432 Centrilobular emphysema: Secondary | ICD-10-CM | POA: Insufficient documentation

## 2017-09-03 DIAGNOSIS — Z87891 Personal history of nicotine dependence: Secondary | ICD-10-CM | POA: Diagnosis not present

## 2017-09-03 DIAGNOSIS — I2510 Atherosclerotic heart disease of native coronary artery without angina pectoris: Secondary | ICD-10-CM | POA: Insufficient documentation

## 2017-09-03 DIAGNOSIS — Z122 Encounter for screening for malignant neoplasm of respiratory organs: Secondary | ICD-10-CM | POA: Diagnosis not present

## 2017-09-03 DIAGNOSIS — M4854XA Collapsed vertebra, not elsewhere classified, thoracic region, initial encounter for fracture: Secondary | ICD-10-CM | POA: Insufficient documentation

## 2017-09-03 DIAGNOSIS — F17210 Nicotine dependence, cigarettes, uncomplicated: Secondary | ICD-10-CM | POA: Diagnosis not present

## 2017-09-03 NOTE — Progress Notes (Signed)
In accordance with CMS guidelines, patient has met eligibility criteria including age, absence of signs or symptoms of lung cancer.  Social History   Tobacco Use  . Smoking status: Current Every Day Smoker    Packs/day: 0.75    Years: 60.00    Pack years: 45.00    Types: Cigarettes  . Smokeless tobacco: Never Used  Substance Use Topics  . Alcohol use: Yes    Comment: socially  . Drug use: No      A shared decision-making session was conducted prior to the performance of CT scan. This includes one or more decision aids, includes benefits and harms of screening, follow-up diagnostic testing, over-diagnosis, false positive rate, and total radiation exposure.   Counseling on the importance of adherence to annual lung cancer LDCT screening, impact of co-morbidities, and ability or willingness to undergo diagnosis and treatment is imperative for compliance of the program.   Counseling on the importance of continued smoking cessation for former smokers; the importance of smoking cessation for current smokers, and information about tobacco cessation interventions have been given to patient including [place] and [facility] quit [place] programs.   Written order for lung cancer screening with LDCT has been given to the patient and any and all questions have been answered to the best of my abilities.    Yearly follow up will be coordinated by Burgess Estelle, Thoracic Navigator.  Beckey Rutter, DNP, AGNP-C [place] [facility] [phone] (work cell) [phone] (office) 09/03/17 2:37 PM

## 2017-09-07 ENCOUNTER — Encounter: Payer: Self-pay | Admitting: *Deleted

## 2017-09-08 DIAGNOSIS — I493 Ventricular premature depolarization: Secondary | ICD-10-CM | POA: Diagnosis not present

## 2017-09-08 DIAGNOSIS — F17200 Nicotine dependence, unspecified, uncomplicated: Secondary | ICD-10-CM | POA: Diagnosis not present

## 2017-09-08 DIAGNOSIS — R002 Palpitations: Secondary | ICD-10-CM | POA: Diagnosis not present

## 2017-09-14 DIAGNOSIS — D2271 Melanocytic nevi of right lower limb, including hip: Secondary | ICD-10-CM | POA: Diagnosis not present

## 2017-09-14 DIAGNOSIS — D485 Neoplasm of uncertain behavior of skin: Secondary | ICD-10-CM | POA: Diagnosis not present

## 2017-09-14 DIAGNOSIS — D2261 Melanocytic nevi of right upper limb, including shoulder: Secondary | ICD-10-CM | POA: Diagnosis not present

## 2017-09-14 DIAGNOSIS — L400 Psoriasis vulgaris: Secondary | ICD-10-CM | POA: Diagnosis not present

## 2017-09-14 DIAGNOSIS — C44319 Basal cell carcinoma of skin of other parts of face: Secondary | ICD-10-CM | POA: Diagnosis not present

## 2017-09-14 DIAGNOSIS — D2272 Melanocytic nevi of left lower limb, including hip: Secondary | ICD-10-CM | POA: Diagnosis not present

## 2017-09-14 DIAGNOSIS — D225 Melanocytic nevi of trunk: Secondary | ICD-10-CM | POA: Diagnosis not present

## 2017-09-14 DIAGNOSIS — D2262 Melanocytic nevi of left upper limb, including shoulder: Secondary | ICD-10-CM | POA: Diagnosis not present

## 2017-10-15 DIAGNOSIS — M6281 Muscle weakness (generalized): Secondary | ICD-10-CM | POA: Diagnosis not present

## 2017-10-15 DIAGNOSIS — M545 Low back pain: Secondary | ICD-10-CM | POA: Diagnosis not present

## 2017-10-23 DIAGNOSIS — M545 Low back pain: Secondary | ICD-10-CM | POA: Diagnosis not present

## 2017-10-23 DIAGNOSIS — M6281 Muscle weakness (generalized): Secondary | ICD-10-CM | POA: Diagnosis not present

## 2017-10-29 DIAGNOSIS — M545 Low back pain: Secondary | ICD-10-CM | POA: Diagnosis not present

## 2017-10-29 DIAGNOSIS — M6281 Muscle weakness (generalized): Secondary | ICD-10-CM | POA: Diagnosis not present

## 2017-11-04 DIAGNOSIS — C44319 Basal cell carcinoma of skin of other parts of face: Secondary | ICD-10-CM | POA: Diagnosis not present

## 2017-12-20 DIAGNOSIS — Z23 Encounter for immunization: Secondary | ICD-10-CM | POA: Diagnosis not present

## 2018-03-08 DIAGNOSIS — D2261 Melanocytic nevi of right upper limb, including shoulder: Secondary | ICD-10-CM | POA: Diagnosis not present

## 2018-03-08 DIAGNOSIS — D2262 Melanocytic nevi of left upper limb, including shoulder: Secondary | ICD-10-CM | POA: Diagnosis not present

## 2018-03-08 DIAGNOSIS — D2271 Melanocytic nevi of right lower limb, including hip: Secondary | ICD-10-CM | POA: Diagnosis not present

## 2018-03-08 DIAGNOSIS — D2272 Melanocytic nevi of left lower limb, including hip: Secondary | ICD-10-CM | POA: Diagnosis not present

## 2018-03-08 DIAGNOSIS — D225 Melanocytic nevi of trunk: Secondary | ICD-10-CM | POA: Diagnosis not present

## 2018-03-08 DIAGNOSIS — L858 Other specified epidermal thickening: Secondary | ICD-10-CM | POA: Diagnosis not present

## 2018-03-11 DIAGNOSIS — R002 Palpitations: Secondary | ICD-10-CM | POA: Diagnosis not present

## 2018-03-11 DIAGNOSIS — I493 Ventricular premature depolarization: Secondary | ICD-10-CM | POA: Diagnosis not present

## 2018-03-11 DIAGNOSIS — R001 Bradycardia, unspecified: Secondary | ICD-10-CM | POA: Diagnosis not present

## 2018-03-11 DIAGNOSIS — R0602 Shortness of breath: Secondary | ICD-10-CM | POA: Diagnosis not present

## 2018-06-18 ENCOUNTER — Other Ambulatory Visit: Payer: Self-pay | Admitting: Internal Medicine

## 2018-06-18 DIAGNOSIS — I10 Essential (primary) hypertension: Secondary | ICD-10-CM

## 2018-06-18 DIAGNOSIS — E7849 Other hyperlipidemia: Secondary | ICD-10-CM

## 2018-08-09 ENCOUNTER — Ambulatory Visit (INDEPENDENT_AMBULATORY_CARE_PROVIDER_SITE_OTHER): Payer: Medicare Other

## 2018-08-09 VITALS — Ht 66.0 in | Wt 168.0 lb

## 2018-08-09 DIAGNOSIS — Z0000 Encounter for general adult medical examination without abnormal findings: Secondary | ICD-10-CM | POA: Diagnosis not present

## 2018-08-09 NOTE — Patient Instructions (Signed)
Ms. Hantz , Thank you for taking time to come for your Medicare Wellness Visit. I appreciate your ongoing commitment to your health goals. Please review the following plan we discussed and let me know if I can assist you in the future.   Screening recommendations/referrals: Colonoscopy: done 12/02/12. Repeat in 2024. Mammogram: done 06/17/17. Repeat every year.  Bone Density: done 08/11/17. Repeat in 2021. Recommended yearly ophthalmology/optometry visit for glaucoma screening and checkup Recommended yearly dental visit for hygiene and checkup  Vaccinations: Influenza vaccine: done 12/20/17 Pneumococcal vaccine: done 11/16/13 Tdap vaccine: done 01/07/11 Shingles vaccine: Shingrix completed 11/24/16    Conditions/risks identified: Recommend continue healthy eating and physical activity to maintain weight.   Next appointment: Please follow up in one year for your Medicare Annual Wellness visit.     Preventive Care 44 Years and Older, Female Preventive care refers to lifestyle choices and visits with your health care provider that can promote health and wellness. What does preventive care include?  A yearly physical exam. This is also called an annual well check.  Dental exams once or twice a year.  Routine eye exams. Ask your health care provider how often you should have your eyes checked.  Personal lifestyle choices, including:  Daily care of your teeth and gums.  Regular physical activity.  Eating a healthy diet.  Avoiding tobacco and drug use.  Limiting alcohol use.  Practicing safe sex.  Taking low-dose aspirin every day.  Taking vitamin and mineral supplements as recommended by your health care provider. What happens during an annual well check? The services and screenings done by your health care provider during your annual well check will depend on your age, overall health, lifestyle risk factors, and family history of disease. Counseling  Your health care  provider may ask you questions about your:  Alcohol use.  Tobacco use.  Drug use.  Emotional well-being.  Home and relationship well-being.  Sexual activity.  Eating habits.  History of falls.  Memory and ability to understand (cognition).  Work and work Statistician.  Reproductive health. Screening  You may have the following tests or measurements:  Height, weight, and BMI.  Blood pressure.  Lipid and cholesterol levels. These may be checked every 5 years, or more frequently if you are over 72 years old.  Skin check.  Lung cancer screening. You may have this screening every year starting at age 40 if you have a 30-pack-year history of smoking and currently smoke or have quit within the past 15 years.  Fecal occult blood test (FOBT) of the stool. You may have this test every year starting at age 44.  Flexible sigmoidoscopy or colonoscopy. You may have a sigmoidoscopy every 5 years or a colonoscopy every 10 years starting at age 102.  Hepatitis C blood test.  Hepatitis B blood test.  Sexually transmitted disease (STD) testing.  Diabetes screening. This is done by checking your blood sugar (glucose) after you have not eaten for a while (fasting). You may have this done every 1-3 years.  Bone density scan. This is done to screen for osteoporosis. You may have this done starting at age 77.  Mammogram. This may be done every 1-2 years. Talk to your health care provider about how often you should have regular mammograms. Talk with your health care provider about your test results, treatment options, and if necessary, the need for more tests. Vaccines  Your health care provider may recommend certain vaccines, such as:  Influenza vaccine. This is recommended  every year.  Tetanus, diphtheria, and acellular pertussis (Tdap, Td) vaccine. You may need a Td booster every 10 years.  Zoster vaccine. You may need this after age 78.  Pneumococcal 13-valent conjugate (PCV13)  vaccine. One dose is recommended after age 70.  Pneumococcal polysaccharide (PPSV23) vaccine. One dose is recommended after age 55. Talk to your health care provider about which screenings and vaccines you need and how often you need them. This information is not intended to replace advice given to you by your health care provider. Make sure you discuss any questions you have with your health care provider. Document Released: 04/13/2015 Document Revised: 12/05/2015 Document Reviewed: 01/16/2015 Elsevier Interactive Patient Education  2017 [employer] Prevention in the Home Falls can cause injuries. They can happen to people of all ages. There are many things you can do to make your home safe and to help prevent falls. What can I do on the outside of my home?  Regularly fix the edges of walkways and driveways and fix any cracks.  Remove anything that might make you trip as you walk through a door, such as a raised step or threshold.  Trim any bushes or trees on the path to your home.  Use bright outdoor lighting.  Clear any walking paths of anything that might make someone trip, such as rocks or tools.  Regularly check to see if handrails are loose or broken. Make sure that both sides of any steps have handrails.  Any raised decks and porches should have guardrails on the edges.  Have any leaves, snow, or ice cleared regularly.  Use sand or salt on walking paths during winter.  Clean up any spills in your garage right away. This includes oil or grease spills. What can I do in the bathroom?  Use night lights.  Install grab bars by the toilet and in the tub and shower. Do not use towel bars as grab bars.  Use non-skid mats or decals in the tub or shower.  If you need to sit down in the shower, use a plastic, non-slip stool.  Keep the floor dry. Clean up any water that spills on the floor as soon as it happens.  Remove soap buildup in the tub or shower regularly.   Attach bath mats securely with double-sided non-slip rug tape.  Do not have throw rugs and other things on the floor that can make you trip. What can I do in the bedroom?  Use night lights.  Make sure that you have a light by your bed that is easy to reach.  Do not use any sheets or blankets that are too big for your bed. They should not hang down onto the floor.  Have a firm chair that has side arms. You can use this for support while you get dressed.  Do not have throw rugs and other things on the floor that can make you trip. What can I do in the kitchen?  Clean up any spills right away.  Avoid walking on wet floors.  Keep items that you use a lot in easy-to-reach places.  If you need to reach something above you, use a strong step stool that has a grab bar.  Keep electrical cords out of the way.  Do not use floor polish or wax that makes floors slippery. If you must use wax, use non-skid floor wax.  Do not have throw rugs and other things on the floor that can make you trip. What  can I do with my stairs?  Do not leave any items on the stairs.  Make sure that there are handrails on both sides of the stairs and use them. Fix handrails that are broken or loose. Make sure that handrails are as long as the stairways.  Check any carpeting to make sure that it is firmly attached to the stairs. Fix any carpet that is loose or worn.  Avoid having throw rugs at the top or bottom of the stairs. If you do have throw rugs, attach them to the floor with carpet tape.  Make sure that you have a light switch at the top of the stairs and the bottom of the stairs. If you do not have them, ask someone to add them for you. What else can I do to help prevent falls?  Wear shoes that:  Do not have high heels.  Have rubber bottoms.  Are comfortable and fit you well.  Are closed at the toe. Do not wear sandals.  If you use a stepladder:  Make sure that it is fully opened. Do not climb  a closed stepladder.  Make sure that both sides of the stepladder are locked into place.  Ask someone to hold it for you, if possible.  Clearly mark and make sure that you can see:  Any grab bars or handrails.  First and last steps.  Where the edge of each step is.  Use tools that help you move around (mobility aids) if they are needed. These include:  Canes.  Walkers.  Scooters.  Crutches.  Turn on the lights when you go into a dark area. Replace any light bulbs as soon as they burn out.  Set up your furniture so you have a clear path. Avoid moving your furniture around.  If any of your floors are uneven, fix them.  If there are any pets around you, be aware of where they are.  Review your medicines with your doctor. Some medicines can make you feel dizzy. This can increase your chance of falling. Ask your doctor what other things that you can do to help prevent falls. This information is not intended to replace advice given to you by your health care provider. Make sure you discuss any questions you have with your health care provider. Document Released: 01/11/2009 Document Revised: 08/23/2015 Document Reviewed: 04/21/2014 Elsevier Interactive Patient Education  2017 [employer].

## 2018-08-09 NOTE — Progress Notes (Signed)
Subjective:   Melinda Perry is a 73 y.o. female who presents for Medicare Annual (Subsequent) preventive examination.  Virtual Visit via Telephone Note  I connected with Melinda Perry on 08/09/18 at 10:00 AM EDT by telephone and verified that I am speaking with the correct person using two identifiers.  Medicare Annual Wellness visit completed telephonically due to Covid-19 pandemic.   Location: Patient: home Provider: office   I discussed the limitations, risks, security and privacy concerns of performing an evaluation and management service by telephone and the availability of in person appointments. The patient expressed understanding and agreed to proceed.  Some vital signs may be absent or patient reported. Patient unable to check blood pressure due to not having a monitor at home.   [name], LPN   Review of Systems:   Cardiac Risk Factors include: advanced age (>37men, >53 women);dyslipidemia;hypertension     Objective:     Vitals: Ht 5\' 6"  (1.676 m)   Wt 168 lb (76.2 kg)   BMI 27.12 kg/m   Body mass index is 27.12 kg/m.  Advanced Directives 08/09/2018 08/05/2017 01/26/2017 07/23/2016 05/10/2015 04/11/2015  Does Patient Have a Medical Advance Directive? Yes Yes No Yes Yes Yes  Type of Advance Directive Living will;Healthcare Power of Melinda Perry;Living will - Healthcare Power of [place];Living will [place];Living will  Copy of [place] in Chart? Yes - validated most recent copy scanned in chart (See row information) No - copy requested - No - copy requested - -    Tobacco Social History   Tobacco Use  Smoking Status Former Smoker  . Packs/day: 0.75  . Years: 60.00  . Pack years: 45.00  . Types: Cigarettes  . Last attempt to quit: 12/20/2017  . Years since quitting: 0.6  Smokeless Tobacco Never Used     Counseling given: Not Answered   Clinical  Intake:  Pre-visit preparation completed: Yes  Pain : No/denies pain     BMI - recorded: 27.12 Nutritional Status: BMI 25 -29 Overweight Nutritional Risks: None Diabetes: No  How often do you need to have someone help you when you read instructions, pamphlets, or other written materials from your doctor or pharmacy?: 1 - Never  Interpreter Needed?: No  Information entered by :: [name] LPN  Past Medical History:  Diagnosis Date  . High cholesterol   . Hypertension   . PVC (premature ventricular contraction)    Past Surgical History:  Procedure Laterality Date  . AUGMENTATION MAMMAPLASTY Bilateral 1988  . BREAST ENHANCEMENT SURGERY    . BUNIONECTOMY Bilateral   . CERVICAL CONE BIOPSY  1971   LSIL  . facet shots in back  05/2016   [facility]-   . TUBAL LIGATION     Family History  Problem Relation Age of Onset  . Lung cancer Mother   . Alcohol abuse Mother   . Pulmonary fibrosis Father   . Alcohol abuse Sister   . Breast cancer Neg Hx    Social History   Socioeconomic History  . Marital status: Divorced    Spouse name: Not on file  . Number of children: 2  . Years of education: some college  . Highest education level: 12th grade  Occupational History  . Occupation: Retired  Scientific laboratory technician  . Financial resource strain: Not hard at all  . Food insecurity:    Worry: Never true    Inability: Never true  .  Transportation needs:    Medical: No    Non-medical: No  Tobacco Use  . Smoking status: Former Smoker    Packs/day: 0.75    Years: 60.00    Pack years: 45.00    Types: Cigarettes    Last attempt to quit: 12/20/2017    Years since quitting: 0.6  . Smokeless tobacco: Never Used  Substance and Sexual Activity  . Alcohol use: Yes    Comment: socially  . Drug use: No  . Sexual activity: Not Currently  Lifestyle  . Physical activity:    Days per week: 7 days    Minutes per session: 40 min  . Stress: Not at all  Relationships  . Social  connections:    Talks on phone: More than three times a week    Gets together: More than three times a week    Attends religious service: Never    Active member of club or organization: No    Attends meetings of clubs or organizations: Never    Relationship status: Married  Other Topics Concern  . Not on file  Social History Narrative  . Not on file    Outpatient Encounter Medications as of 08/09/2018  Medication Sig  . atorvastatin (LIPITOR) 10 MG tablet TAKE 1 TABLET(10 MG) BY MOUTH DAILY AT 6 PM  . losartan (COZAAR) 25 MG tablet TAKE 1 TABLET(25 MG) BY MOUTH DAILY  . metoprolol succinate (TOPROL-XL) 25 MG 24 hr tablet Take 25 mg by mouth daily. Take 1/2 tablet daily  . triamcinolone cream (KENALOG) 0.1 % triamcinolone acetonide 0.1 % topical cream   No facility-administered encounter medications on file as of 08/09/2018.     Activities of Daily Living In your present state of health, do you have any difficulty performing the following activities: 08/09/2018  Hearing? N  Comment declines hearing aids  Vision? N  Comment wears glasses  Difficulty concentrating or making decisions? N  Walking or climbing stairs? N  Dressing or bathing? N  Doing errands, shopping? N  Preparing Food and eating ? N  Using the Toilet? N  In the past six months, have you accidently leaked urine? N  Comment urgency but no leakage  Do you have problems with loss of bowel control? N  Managing your Medications? N  Managing your Finances? N  Housekeeping or managing your Housekeeping? N  Some recent data might be hidden    Patient Care Team: Glean Hess, MD as PCP - General (Internal Medicine) Isaias Cowman, MD as Consulting Physician (Cardiology) Dasher, Rayvon Char, MD as Consulting Physician (Dermatology) [facility] (Pain Medicine)    Assessment:   This is a routine wellness examination for Melinda Perry.  Exercise Activities and Dietary recommendations Current Exercise Habits: Home  exercise routine, Type of exercise: walking, Time (Minutes): 40, Frequency (Times/Week): 7, Weekly Exercise (Minutes/Week): 280, Intensity: Moderate, Exercise limited by: None identified  Goals    . DIET - INCREASE WATER INTAKE     Recommend to drink at least 6-8 8oz glasses of water per day.    . Patient Stated     Pt states she would like to maintain weight since quitting smoking.     . Weight < 200 lb (90.719 kg)       Fall Risk Fall Risk  08/09/2018 08/05/2017 07/23/2016 04/11/2015  Falls in the past year? 0 No No No  Number falls in past yr: 0 - - -  Injury with Fall? 0 - - -  Risk for  fall due to : - Impaired vision - -  Risk for fall due to: Comment - R eyelid obstructing visual field - -  Follow up Falls prevention discussed - - -   FALL RISK PREVENTION PERTAINING TO THE HOME:  Any stairs in or around the home? Yes  If so, do they handrails? Yes   Home free of loose throw rugs in walkways, pet beds, electrical cords, etc? Yes  Adequate lighting in your home to reduce risk of falls? Yes   ASSISTIVE DEVICES UTILIZED TO PREVENT FALLS:  Life alert? No  Use of a cane, walker or w/c? No  Grab bars in the bathroom? Yes  Shower chair or bench in shower? No  Elevated toilet seat or a handicapped toilet? No   DME ORDERS:  DME order needed?  No   TIMED UP AND GO:  Was the test performed? No . Telephonic visit.  Education: Fall risk prevention has been discussed.  Intervention(s) required? No   Depression Screen PHQ 2/9 Scores 08/09/2018 08/05/2017 07/23/2016 04/11/2015  PHQ - 2 Score 0 0 0 0  PHQ- 9 Score - - - -     Cognitive Function     6CIT Screen 08/09/2018 08/05/2017 07/23/2016  What Year? 0 points 0 points 0 points  What month? 0 points 0 points 0 points  What time? 0 points 0 points 0 points  Count back from 20 0 points 0 points 0 points  Months in reverse 0 points 0 points 0 points  Repeat phrase 0 points 0 points 0 points  Total Score 0 0 0    Immunization  History  Administered Date(s) Administered  . Influenza Split 01/29/2009, 03/31/2009, 01/07/2011  . Influenza, High Dose Seasonal PF 01/06/2017  . Influenza-Unspecified 12/30/2014, 01/13/2016, 12/20/2017  . Pneumococcal Conjugate-13 11/16/2013  . Pneumococcal Polysaccharide-23 07/29/2009, 04/01/2010  . Tdap 04/01/2010, 01/07/2011  . Zoster 04/01/2010  . Zoster Recombinat (Shingrix) 07/24/2016, 11/24/2016    Qualifies for Shingles Vaccine? Yes  Shingrix series completed. 11/24/16.  Tdap: Up to date  Flu Vaccine: Up to date  Pneumococcal Vaccine: Up to date   Screening Tests Health Maintenance  Topic Date Due  . MAMMOGRAM  06/18/2018  . INFLUENZA VACCINE  10/30/2018  . TETANUS/TDAP  01/06/2021  . COLONOSCOPY  12/03/2022  . PNA vac Low Risk Adult  Completed  . DEXA SCAN  Addressed  . Hepatitis C Screening  Addressed   Cancer Screenings:  Colorectal Screening: Completed 12/02/12. Repeat every 10 years.  Mammogram: Completed done 06/17/17. Repeat every year. Previously ordered and scheduled. Pt to call to r/s appt due to Covid 19.   Bone Density: Completed 08/11/17. Results reflect  OSTEOPENIA. Repeat every 2 years.   Lung Cancer Screening: (Low Dose CT Chest recommended if Age 67-80 years, 30 pack-year currently smoking OR have quit w/in 15years.) does qualify. Pt participated in Quit program and [facility] and will complete annual lung screenings.   Additional Screening:  Hepatitis C Screening: does qualify; Completed 04/01/13  Vision Screening: Recommended annual ophthalmology exams for early detection of glaucoma and other disorders of the eye. Is the patient up to date with their annual eye exam?  Yes  Who is the provider or what is the name of the office in which the pt attends annual eye exams? [place] Screening: Recommended annual dental exams for proper oral hygiene  Community Resource Referral:  CRR required this visit?  No      Plan:  I have  personally reviewed and addressed the Medicare Annual Wellness questionnaire and have noted the following in the patient's chart:  A. Medical and social history B. Use of alcohol, tobacco or illicit drugs  C. Current medications and supplements D. Functional ability and status E.  Nutritional status F.  Physical activity G. Advance directives H. List of other physicians I.  Hospitalizations, surgeries, and ER visits in previous 12 months J.  [place] such as hearing and vision if needed, cognitive and depression L. Referrals and appointments   In addition, I have reviewed and discussed with patient certain preventive protocols, quality metrics, and best practice recommendations. A written personalized care plan for preventive services as well as general preventive health recommendations were provided to patient.   Signed,  [name], LPN Nurse Health Advisor   Nurse Notes: pt doing well and appreciative of telephonic visit today. She has appt for physical next week and plans to discuss intermittent pain/discomfort under left breast near base of sternum that almost feel like a gas bubble, she denies heartburn or indigestion but attributes it to possibly being related to high fat keto diet which she plans to slowly come off of in the next few weeks.

## 2018-08-18 ENCOUNTER — Ambulatory Visit (INDEPENDENT_AMBULATORY_CARE_PROVIDER_SITE_OTHER): Payer: Medicare Other | Admitting: Internal Medicine

## 2018-08-18 ENCOUNTER — Encounter: Payer: Self-pay | Admitting: Internal Medicine

## 2018-08-18 ENCOUNTER — Other Ambulatory Visit: Payer: Self-pay | Admitting: Internal Medicine

## 2018-08-18 ENCOUNTER — Other Ambulatory Visit: Payer: Self-pay

## 2018-08-18 VITALS — BP 120/70 | HR 59 | Ht 66.0 in | Wt 171.0 lb

## 2018-08-18 DIAGNOSIS — M8580 Other specified disorders of bone density and structure, unspecified site: Secondary | ICD-10-CM

## 2018-08-18 DIAGNOSIS — Z1231 Encounter for screening mammogram for malignant neoplasm of breast: Secondary | ICD-10-CM

## 2018-08-18 DIAGNOSIS — I10 Essential (primary) hypertension: Secondary | ICD-10-CM

## 2018-08-18 DIAGNOSIS — K219 Gastro-esophageal reflux disease without esophagitis: Secondary | ICD-10-CM | POA: Diagnosis not present

## 2018-08-18 DIAGNOSIS — E782 Mixed hyperlipidemia: Secondary | ICD-10-CM | POA: Diagnosis not present

## 2018-08-18 DIAGNOSIS — F17201 Nicotine dependence, unspecified, in remission: Secondary | ICD-10-CM

## 2018-08-18 LAB — POCT URINALYSIS DIPSTICK
Bilirubin, UA: NEGATIVE
Blood, UA: NEGATIVE
Glucose, UA: NEGATIVE
Ketones, UA: NEGATIVE
Nitrite, UA: NEGATIVE
Protein, UA: NEGATIVE
Spec Grav, UA: <=1.005 — AB (ref 1.010–1.025)
Urobilinogen, UA: 0.2 E.U./dL
pH, UA: 7.5 (ref 5.0–8.0)

## 2018-08-18 NOTE — Progress Notes (Signed)
Date:  08/18/2018   Name:  Melinda Perry [facility]   DOB:  April 01, 1945   MRN:  [id number]   Chief Complaint: Hypertension (Breast exam for Medicare yearly check up. Just had MAW 2 weeks ago.) Melinda Perry is a 73 y.o. female who presents today for her annual Exam. She feels fairly well. She reports exercising walking every day. She reports she is sleeping fairly well. She denies breast issues. She is currently following a KETO diet to help lose weight since quitting smoking.  Mammogram 05/2017 Colonoscopy  11/2012 DEXA 2019 Immunizations are up to date  Hypertension  This is a chronic problem. The problem is controlled. Pertinent negatives include no chest pain, headaches, palpitations or shortness of breath. Past treatments include angiotensin blockers and beta blockers. The current treatment provides significant improvement.  Hyperlipidemia  This is a chronic problem. The problem is controlled. Pertinent negatives include no chest pain or shortness of breath. Current antihyperlipidemic treatment includes statins. The current treatment provides significant improvement of lipids.  Gastroesophageal Reflux  She complains of heartburn. She reports no abdominal pain, no chest pain, no coughing or no wheezing. This is a new problem. The current episode started more than 1 month ago. The problem occurs frequently. Exacerbated by: keto diet. Pertinent negatives include no fatigue. She has tried a PPI for the symptoms. The treatment provided significant relief.   Lab Results  Component Value Date   CREATININE 0.75 08/13/2017   BUN 13 08/13/2017   NA 139 08/13/2017   K 4.6 08/13/2017   CL 102 08/13/2017   CO2 23 08/13/2017   Lab Results  Component Value Date   CHOL 141 08/13/2017   HDL 53 08/13/2017   LDLCALC 71 08/13/2017   TRIG 83 08/13/2017   CHOLHDL 2.7 08/13/2017   Lab Results  Component Value Date   TSH 2.520 08/13/2017     Review of Systems  Constitutional: Negative  for chills, fatigue and fever.  HENT: Positive for postnasal drip. Negative for congestion, hearing loss, tinnitus, trouble swallowing and voice change.   Eyes: Negative for visual disturbance.  Respiratory: Negative for cough, chest tightness, shortness of breath and wheezing.   Cardiovascular: Negative for chest pain, palpitations and leg swelling.  Gastrointestinal: Positive for heartburn. Negative for abdominal pain, constipation, diarrhea and vomiting.       Gerd  Endocrine: Negative for polydipsia and polyuria.  Genitourinary: Negative for dysuria, frequency, genital sores, vaginal bleeding and vaginal discharge.  Musculoskeletal: Negative for arthralgias, gait problem and joint swelling.  Skin: Negative for color change and rash.  Neurological: Negative for dizziness, tremors, light-headedness and headaches.  Hematological: Negative for adenopathy. Does not bruise/bleed easily.  Psychiatric/Behavioral: Negative for dysphoric mood and sleep disturbance. The patient is not nervous/anxious.     Patient Active Problem List   Diagnosis Date Noted  . Osteopenia determined by x-ray 08/13/2017  . Chronic fatigue 04/23/2017  . Alopecia areata 07/23/2016  . Heart palpitations 07/23/2016  . Xerosis cutis 04/11/2015  . Mixed hyperlipidemia 03/09/2015  . Tobacco use disorder, moderate, in sustained remission 03/09/2015  . History of vertebral compression fracture 03/09/2015  . History of colon polyps 03/09/2015  . Benign hypertension 03/09/2015  . Microscopic hematuria 03/09/2015    Allergies  Allergen Reactions  . Bupropion Other (See Comments)    Hair loss    Past Surgical History:  Procedure Laterality Date  . AUGMENTATION MAMMAPLASTY Bilateral 1988  . BREAST ENHANCEMENT SURGERY    . BUNIONECTOMY Bilateral   .  CERVICAL CONE BIOPSY  1971   LSIL  . facet shots in back  05/2016   [facility]-   . TUBAL LIGATION      Social History   Tobacco Use  . Smoking status: Former  Smoker    Packs/day: 0.75    Years: 60.00    Pack years: 45.00    Types: Cigarettes    Last attempt to quit: 12/20/2017    Years since quitting: 0.6  . Smokeless tobacco: Never Used  Substance Use Topics  . Alcohol use: Yes    Comment: socially  . Drug use: No     Medication list has been reviewed and updated.  Current Meds  Medication Sig  . atorvastatin (LIPITOR) 10 MG tablet TAKE 1 TABLET(10 MG) BY MOUTH DAILY AT 6 PM  . losartan (COZAAR) 25 MG tablet TAKE 1 TABLET(25 MG) BY MOUTH DAILY  . metoprolol succinate (TOPROL-XL) 25 MG 24 hr tablet Take 25 mg by mouth daily. Take 1/2 tablet daily  . omeprazole (PRILOSEC) 10 MG capsule Take 10 mg by mouth daily. TEMPERARY- OTC for Keto Diet  . triamcinolone cream (KENALOG) 0.1 % triamcinolone acetonide 0.1 % topical cream    PHQ 2/9 Scores 08/18/2018 08/09/2018 08/05/2017 07/23/2016  PHQ - 2 Score 0 0 0 0  PHQ- 9 Score - - - -    BP Readings from Last 3 Encounters:  08/18/18 120/70  08/13/17 119/76  08/05/17 (!) 110/58    Physical Exam Vitals signs and nursing note reviewed.  Constitutional:      General: She is not in acute distress.    Appearance: She is well-developed.  HENT:     Head: Normocephalic and atraumatic.     Right Ear: Tympanic membrane and ear canal normal.     Left Ear: Tympanic membrane and ear canal normal.     Nose:     Right Sinus: No maxillary sinus tenderness.     Left Sinus: No maxillary sinus tenderness.     Mouth/Throat:     Pharynx: Uvula midline.  Eyes:     General: No scleral icterus.       Right eye: No discharge.        Left eye: No discharge.     Conjunctiva/sclera: Conjunctivae normal.  Neck:     Musculoskeletal: Normal range of motion. No erythema.     Thyroid: No thyromegaly.     Vascular: No carotid bruit.  Cardiovascular:     Rate and Rhythm: Normal rate and regular rhythm.     Pulses: Normal pulses.     Heart sounds: Normal heart sounds.  Pulmonary:     Effort: Pulmonary effort  is normal. No respiratory distress.     Breath sounds: No wheezing.  Chest:     Breasts:        Right: No mass, nipple discharge, skin change or tenderness.        Left: No mass, nipple discharge, skin change or tenderness.  Abdominal:     General: Bowel sounds are normal.     Palpations: Abdomen is soft.     Tenderness: There is no abdominal tenderness.  Musculoskeletal: Normal range of motion.  Lymphadenopathy:     Cervical: No cervical adenopathy.  Skin:    General: Skin is warm and dry.     Findings: No rash.       Neurological:     Mental Status: She is alert and oriented to person, place, and time.  Cranial Nerves: No cranial nerve deficit.     Sensory: No sensory deficit.     Deep Tendon Reflexes: Reflexes are normal and symmetric.  Psychiatric:        Speech: Speech normal.        Behavior: Behavior normal.        Thought Content: Thought content normal.     Wt Readings from Last 3 Encounters:  08/18/18 171 lb (77.6 kg)  08/09/18 168 lb (76.2 kg)  09/03/17 164 lb (74.4 kg)    BP 120/70   Pulse (!) 59   Ht 5\' 6"  (1.676 m)   Wt 171 lb (77.6 kg)   SpO2 99%   BMI 27.60 kg/m   Assessment and Plan: 1. Benign hypertension Normal exam Continue diet and exercise - Comprehensive metabolic panel - CBC with Differential/Platelet - TSH - POCT urinalysis dipstick  2. Mixed hyperlipidemia On statin therapy - Lipid panel  3. Gastroesophageal reflux disease, esophagitis presence not specified Continue PPI as needed  4. Encounter for screening mammogram for breast cancer Schedule at her convenience - [place]; Future  5. Osteopenia determined by x-ray DEXA ins 2019 - continue calcium, vitamin D, exercise  6. Tobacco use disorder, moderate, in sustained remission Remains tobacco free   Partially dictated using Editor, commissioning. Any errors are unintentional.  Halina Maidens, MD [facility]   08/18/2018

## 2018-08-19 LAB — COMPREHENSIVE METABOLIC PANEL
ALT: 26 IU/L (ref 0–32)
AST: 27 IU/L (ref 0–40)
Albumin/Globulin Ratio: 1.6 (ref 1.2–2.2)
Albumin: 4.5 g/dL (ref 3.7–4.7)
Alkaline Phosphatase: 76 IU/L (ref 39–117)
BUN/Creatinine Ratio: 18 (ref 12–28)
BUN: 12 mg/dL (ref 8–27)
Bilirubin Total: 0.5 mg/dL (ref 0.0–1.2)
CO2: 20 mmol/L (ref 20–29)
Calcium: 9.5 mg/dL (ref 8.7–10.3)
Chloride: 100 mmol/L (ref 96–106)
Creatinine, Ser: 0.68 mg/dL (ref 0.57–1.00)
GFR calc Af Amer: 100 mL/min/{1.73_m2} (ref >59)
GFR calc non Af Amer: 86 mL/min/{1.73_m2} (ref >59)
Globulin, Total: 2.8 g/dL (ref 1.5–4.5)
Glucose: 109 mg/dL — ABNORMAL HIGH (ref 65–99)
Potassium: 4.6 mmol/L (ref 3.5–5.2)
Sodium: 137 mmol/L (ref 134–144)
Total Protein: 7.3 g/dL (ref 6.0–8.5)

## 2018-08-19 LAB — CBC WITH DIFFERENTIAL/PLATELET
Basophils Absolute: 0 10*3/uL (ref 0.0–0.2)
Basos: 1 %
EOS (ABSOLUTE): 0 10*3/uL (ref 0.0–0.4)
Eos: 0 %
Hematocrit: 45.7 % (ref 34.0–46.6)
Hemoglobin: 15.4 g/dL (ref 11.1–15.9)
Immature Grans (Abs): 0 10*3/uL (ref 0.0–0.1)
Immature Granulocytes: 0 %
Lymphocytes Absolute: 1.7 10*3/uL (ref 0.7–3.1)
Lymphs: 30 %
MCH: 29.4 pg (ref 26.6–33.0)
MCHC: 33.7 g/dL (ref 31.5–35.7)
MCV: 87 fL (ref 79–97)
Monocytes Absolute: 0.4 10*3/uL (ref 0.1–0.9)
Monocytes: 7 %
Neutrophils Absolute: 3.4 10*3/uL (ref 1.4–7.0)
Neutrophils: 62 %
Platelets: 192 10*3/uL (ref 150–450)
RBC: 5.23 x10E6/uL (ref 3.77–5.28)
RDW: 13.4 % (ref 11.7–15.4)
WBC: 5.6 10*3/uL (ref 3.4–10.8)

## 2018-08-19 LAB — LIPID PANEL
Chol/HDL Ratio: 2.7 ratio (ref 0.0–4.4)
Cholesterol, Total: 175 mg/dL (ref 100–199)
HDL: 66 mg/dL (ref >39)
LDL Calculated: 92 mg/dL (ref 0–99)
Triglycerides: 84 mg/dL (ref 0–149)
VLDL Cholesterol Cal: 17 mg/dL (ref 5–40)

## 2018-08-19 LAB — TSH: TSH: 2.88 u[IU]/mL (ref 0.450–4.500)

## 2018-09-01 ENCOUNTER — Ambulatory Visit: Payer: Medicare Other

## 2018-09-07 DIAGNOSIS — R001 Bradycardia, unspecified: Secondary | ICD-10-CM | POA: Diagnosis not present

## 2018-09-07 DIAGNOSIS — I493 Ventricular premature depolarization: Secondary | ICD-10-CM | POA: Diagnosis not present

## 2018-09-07 DIAGNOSIS — R002 Palpitations: Secondary | ICD-10-CM | POA: Diagnosis not present

## 2018-09-07 DIAGNOSIS — Z87891 Personal history of nicotine dependence: Secondary | ICD-10-CM | POA: Diagnosis not present

## 2018-09-07 DIAGNOSIS — R5382 Chronic fatigue, unspecified: Secondary | ICD-10-CM | POA: Diagnosis not present

## 2018-09-07 DIAGNOSIS — R0602 Shortness of breath: Secondary | ICD-10-CM | POA: Diagnosis not present

## 2018-09-15 ENCOUNTER — Ambulatory Visit
Admission: RE | Admit: 2018-09-15 | Discharge: 2018-09-15 | Disposition: A | Discharge status: Home / Self Care | Payer: Medicare Other | Source: Ambulatory Visit | Attending: Internal Medicine | Admitting: Internal Medicine

## 2018-09-15 ENCOUNTER — Other Ambulatory Visit: Payer: Self-pay

## 2018-09-15 DIAGNOSIS — Z1231 Encounter for screening mammogram for malignant neoplasm of breast: Secondary | ICD-10-CM | POA: Diagnosis not present

## 2018-09-15 DIAGNOSIS — H35371 Puckering of macula, right eye: Secondary | ICD-10-CM | POA: Diagnosis not present

## 2018-09-15 HISTORY — DX: Malignant (primary) neoplasm, unspecified: C80.1

## 2018-10-05 ENCOUNTER — Telehealth: Payer: Self-pay | Admitting: *Deleted

## 2018-10-05 DIAGNOSIS — Z122 Encounter for screening for malignant neoplasm of respiratory organs: Secondary | ICD-10-CM

## 2018-10-05 DIAGNOSIS — Z87891 Personal history of nicotine dependence: Secondary | ICD-10-CM

## 2018-10-05 NOTE — Telephone Encounter (Signed)
Patient has been notified that annual lung cancer screening low dose CT scan is due currently or will be in near future. Confirmed that patient is within the age range of 55-77, and asymptomatic, (no signs or symptoms of lung cancer). Patient denies illness that would prevent curative treatment for lung cancer if found. Verified smoking history, (former, quit 12/21/18, 45 pack year). The shared decision making visit was done 09/03/17. Patient is agreeable for CT scan being scheduled.

## 2018-10-07 ENCOUNTER — Ambulatory Visit
Admission: RE | Admit: 2018-10-07 | Discharge: 2018-10-07 | Disposition: A | Discharge status: Home / Self Care | Payer: Medicare Other | Source: Ambulatory Visit | Attending: Nurse Practitioner | Admitting: Nurse Practitioner

## 2018-10-07 ENCOUNTER — Other Ambulatory Visit: Payer: Self-pay

## 2018-10-07 DIAGNOSIS — Z122 Encounter for screening for malignant neoplasm of respiratory organs: Secondary | ICD-10-CM | POA: Diagnosis not present

## 2018-10-07 DIAGNOSIS — Z87891 Personal history of nicotine dependence: Secondary | ICD-10-CM | POA: Insufficient documentation

## 2018-10-11 ENCOUNTER — Encounter: Payer: Self-pay | Admitting: *Deleted

## 2018-12-16 DIAGNOSIS — Z23 Encounter for immunization: Secondary | ICD-10-CM | POA: Diagnosis not present

## 2019-02-09 DIAGNOSIS — H5789 Other specified disorders of eye and adnexa: Secondary | ICD-10-CM | POA: Diagnosis not present

## 2019-02-16 DIAGNOSIS — H5789 Other specified disorders of eye and adnexa: Secondary | ICD-10-CM | POA: Diagnosis not present

## 2019-02-23 ENCOUNTER — Ambulatory Visit: Payer: Medicare Other | Admitting: Internal Medicine

## 2019-03-03 DIAGNOSIS — R002 Palpitations: Secondary | ICD-10-CM | POA: Diagnosis not present

## 2019-03-03 DIAGNOSIS — I493 Ventricular premature depolarization: Secondary | ICD-10-CM | POA: Diagnosis not present

## 2019-03-07 DIAGNOSIS — H5789 Other specified disorders of eye and adnexa: Secondary | ICD-10-CM | POA: Diagnosis not present

## 2019-03-08 ENCOUNTER — Encounter: Payer: Self-pay | Admitting: Internal Medicine

## 2019-03-08 ENCOUNTER — Other Ambulatory Visit: Payer: Self-pay

## 2019-03-08 ENCOUNTER — Ambulatory Visit (INDEPENDENT_AMBULATORY_CARE_PROVIDER_SITE_OTHER): Payer: Medicare Other | Admitting: Internal Medicine

## 2019-03-08 VITALS — BP 130/72 | HR 52 | Ht 66.0 in | Wt 178.0 lb

## 2019-03-08 DIAGNOSIS — E782 Mixed hyperlipidemia: Secondary | ICD-10-CM | POA: Diagnosis not present

## 2019-03-08 DIAGNOSIS — I10 Essential (primary) hypertension: Secondary | ICD-10-CM | POA: Diagnosis not present

## 2019-03-08 DIAGNOSIS — I700 Atherosclerosis of aorta: Secondary | ICD-10-CM | POA: Insufficient documentation

## 2019-03-08 NOTE — Progress Notes (Signed)
Date:  03/08/2019   Name:  Melinda Perry [facility]   DOB:  September 15, 1945   MRN:  [id number]   Chief Complaint: Hypertension (6 month follow up.)  Hypertension This is a chronic problem. The problem is controlled. Pertinent negatives include no chest pain, headaches, palpitations or shortness of breath. Past treatments include beta blockers and angiotensin blockers. The current treatment provides significant improvement. There are no compliance problems.     Lab Results  Component Value Date   CREATININE 0.68 08/18/2018   BUN 12 08/18/2018   NA 137 08/18/2018   K 4.6 08/18/2018   CL 100 08/18/2018   CO2 20 08/18/2018   Lab Results  Component Value Date   CHOL 175 08/18/2018   HDL 66 08/18/2018   LDLCALC 92 08/18/2018   TRIG 84 08/18/2018   CHOLHDL 2.7 08/18/2018   Lab Results  Component Value Date   TSH 2.880 08/18/2018   No results found for: HGBA1C   Review of Systems  Constitutional: Negative for chills, fatigue and fever.  HENT: Negative for trouble swallowing.   Respiratory: Negative for cough, chest tightness and shortness of breath.   Cardiovascular: Negative for chest pain, palpitations and leg swelling.  Neurological: Negative for dizziness and headaches.  Psychiatric/Behavioral: Negative for dysphoric mood. The patient is not nervous/anxious.     Patient Active Problem List   Diagnosis Date Noted  . Aortic atherosclerosis ([place]) 03/08/2019  . Osteopenia determined by x-ray 08/13/2017  . Alopecia areata 07/23/2016  . Heart palpitations 07/23/2016  . Xerosis cutis 04/11/2015  . Mixed hyperlipidemia 03/09/2015  . Tobacco use disorder, moderate, in sustained remission 03/09/2015  . History of vertebral compression fracture 03/09/2015  . History of colon polyps 03/09/2015  . Benign hypertension 03/09/2015  . Microscopic hematuria 03/09/2015    Allergies  Allergen Reactions  . Bupropion Other (See Comments)    Hair loss    Past Surgical History:   Procedure Laterality Date  . AUGMENTATION MAMMAPLASTY Bilateral 1988  . BREAST ENHANCEMENT SURGERY    . BUNIONECTOMY Bilateral   . CERVICAL CONE BIOPSY  1971   LSIL  . facet shots in back  05/2016   [facility]-   . TUBAL LIGATION      Social History   Tobacco Use  . Smoking status: Former Smoker    Packs/day: 0.75    Years: 60.00    Pack years: 45.00    Types: Cigarettes    Quit date: 12/20/2017    Years since quitting: 1.2  . Smokeless tobacco: Never Used  Substance Use Topics  . Alcohol use: Yes    Comment: socially  . Drug use: No     Medication list has been reviewed and updated.  Current Meds  Medication Sig  . atorvastatin (LIPITOR) 10 MG tablet TAKE 1 TABLET(10 MG) BY MOUTH DAILY AT 6 PM  . losartan (COZAAR) 25 MG tablet TAKE 1 TABLET(25 MG) BY MOUTH DAILY  . metoprolol succinate (TOPROL-XL) 25 MG 24 hr tablet Take 25 mg by mouth daily. Take 1/2 tablet daily  . omeprazole (PRILOSEC) 10 MG capsule Take 10 mg by mouth daily. TEMPERARY- OTC for Keto Diet  . triamcinolone cream (KENALOG) 0.1 % triamcinolone acetonide 0.1 % topical cream    PHQ 2/9 Scores 03/08/2019 08/18/2018 08/09/2018 08/05/2017  PHQ - 2 Score 0 0 0 0  PHQ- 9 Score - - - -    BP Readings from Last 3 Encounters:  03/08/19 130/72  08/18/18 120/70  08/13/17  119/76    Physical Exam Vitals signs and nursing note reviewed.  Constitutional:      General: She is not in acute distress.    Appearance: She is well-developed.  HENT:     Head: Normocephalic and atraumatic.  Neck:     Musculoskeletal: Normal range of motion.     Vascular: No carotid bruit.  Cardiovascular:     Rate and Rhythm: Normal rate and regular rhythm.     Pulses: Normal pulses.     Heart sounds: No murmur.  Pulmonary:     Effort: Pulmonary effort is normal. No respiratory distress.  Musculoskeletal:     Right lower leg: No edema.     Left lower leg: No edema.  Lymphadenopathy:     Cervical: No cervical adenopathy.   Skin:    General: Skin is warm and dry.     Findings: No rash.  Neurological:     Mental Status: She is alert and oriented to person, place, and time.  Psychiatric:        Behavior: Behavior normal.        Thought Content: Thought content normal.     Wt Readings from Last 3 Encounters:  03/08/19 178 lb (80.7 kg)  10/07/18 169 lb (76.7 kg)  08/18/18 171 lb (77.6 kg)    BP 130/72   Pulse (!) 52   Ht 5\' 6"  (1.676 m)   Wt 178 lb (80.7 kg)   SpO2 98%   BMI 28.73 kg/m   Assessment and Plan: 1. Benign hypertension Clinically stable exam with well controlled BP.   Tolerating medications without side effects at this time. Pt to continue current regimen and low sodium diet; benefits of regular exercise as able discussed.  2. Mixed hyperlipidemia Tolerating statin medication without side effects at this time Continue same therapy without change at this time.  3. Aortic atherosclerosis (HCC) Pt continues tobacco free on statin medications   Partially dictated using Editor, commissioning. Any errors are unintentional.  Halina Maidens, MD [facility]  03/08/2019

## 2019-05-01 ENCOUNTER — Other Ambulatory Visit: Payer: Self-pay

## 2019-05-01 ENCOUNTER — Ambulatory Visit
Admission: EM | Admit: 2019-05-01 | Discharge: 2019-05-01 | Disposition: A | Discharge status: Home / Self Care | Payer: Medicare Other | Attending: Family Medicine | Admitting: Family Medicine

## 2019-05-01 DIAGNOSIS — S61210A Laceration without foreign body of right index finger without damage to nail, initial encounter: Secondary | ICD-10-CM

## 2019-05-01 DIAGNOSIS — W260XXA Contact with knife, initial encounter: Secondary | ICD-10-CM | POA: Diagnosis not present

## 2019-05-01 NOTE — ED Provider Notes (Signed)
[facility] ____________________________________________  Time seen: Approximately 12:03 PM  I have reviewed the triage vital signs and the nursing notes.   HISTORY  Chief Complaint Extremity Laceration (right index finger)   HPI Ranesha Grindstaff is a 74 y.o. female presenting for evaluation of laceration to right index finger that occurred just prior to arrival.  States that she was cutting an orange with a kitchen knife and accidentally cut her finger.  States minimal pain.  Reports tetanus immunization up-to-date and within the last 10 years.  Denies paresthesias, pain radiation or decreased range of motion.  Denies recent sickness.  Reports otherwise doing well.    Past Medical History:  Diagnosis Date  . Ankle pain 04/11/2015  . Cancer ([place])    skin ca -squamous cell on face  . High cholesterol   . Hypertension   . PVC (premature ventricular contraction)     Patient Active Problem List   Diagnosis Date Noted  . Aortic atherosclerosis ([place]) 03/08/2019  . Osteopenia determined by x-ray 08/13/2017  . Alopecia areata 07/23/2016  . Heart palpitations 07/23/2016  . Xerosis cutis 04/11/2015  . Mixed hyperlipidemia 03/09/2015  . Tobacco use disorder, moderate, in sustained remission 03/09/2015  . History of vertebral compression fracture 03/09/2015  . History of colon polyps 03/09/2015  . Benign hypertension 03/09/2015  . Microscopic hematuria 03/09/2015    Past Surgical History:  Procedure Laterality Date  . AUGMENTATION MAMMAPLASTY Bilateral 1988  . BREAST ENHANCEMENT SURGERY    . BUNIONECTOMY Bilateral   . CERVICAL CONE BIOPSY  1971   LSIL  . facet shots in back  05/2016   [facility]-   . TUBAL LIGATION       No current facility-administered medications for this encounter.  Current Outpatient Medications:  .  atorvastatin (LIPITOR) 10 MG tablet, TAKE 1 TABLET(10 MG) BY MOUTH DAILY AT 6 PM, Disp: 90 tablet, Rfl: 3 .  losartan (COZAAR) 25  MG tablet, TAKE 1 TABLET(25 MG) BY MOUTH DAILY, Disp: 90 tablet, Rfl: 3 .  metoprolol succinate (TOPROL-XL) 25 MG 24 hr tablet, Take 25 mg by mouth daily. Take 1/2 tablet daily, Disp: , Rfl:  .  omeprazole (PRILOSEC) 10 MG capsule, Take 10 mg by mouth daily. TEMPERARY- OTC for Keto Diet, Disp: , Rfl:  .  triamcinolone cream (KENALOG) 0.1 %, triamcinolone acetonide 0.1 % topical cream, Disp: , Rfl:   Allergies Bupropion  Family History  Problem Relation Age of Onset  . Lung cancer Mother   . Alcohol abuse Mother   . Pulmonary fibrosis Father   . Alcohol abuse Sister   . Breast cancer Neg Hx     Social History Social History   Tobacco Use  . Smoking status: Former Smoker    Packs/day: 0.75    Years: 60.00    Pack years: 45.00    Types: Cigarettes    Quit date: 12/20/2017    Years since quitting: 1.3  . Smokeless tobacco: Never Used  Substance Use Topics  . Alcohol use: Yes    Comment: socially  . Drug use: No    Review of Systems Constitutional: No fever ENT: No sore throat. Cardiovascular: Denies chest pain. Respiratory: Denies shortness of breath. Gastrointestinal: No abdominal pain.  Musculoskeletal: Negative for back pain. Skin: Positive laceration.  ____________________________________________   PHYSICAL EXAM:  VITAL SIGNS: ED Triage Vitals  Enc Vitals Group     BP 05/01/19 1059 (!) 157/65     Pulse Rate 05/01/19 1059 63  Resp -- 18     Temp 05/01/19 1059 97.8 F (36.6 C)     Temp Source 05/01/19 1059 Oral     SpO2 05/01/19 1059 100 %     Weight 05/01/19 1058 170 lb (77.1 kg)     Height 05/01/19 1058 5\' 6"  (1.676 m)     Head Circumference --      Peak Flow --      Pain Score 05/01/19 1057 1     Pain Loc --      Pain Edu? --      Excl. in [place]? --     Constitutional: Alert and oriented. Well appearing and in no acute distress. Eyes: Conjunctivae are normal.  ENT      Head: Normocephalic and atraumatic. Cardiovascular: Good peripheral  circulation. Respiratory: Normal respiratory effort without tachypnea nor retractions.  Musculoskeletal: Steady gait.  Neurologic:  Normal speech and language. Speech is normal. No gait instability.  Skin:  Skin is warm, dry.  Except: Right index finger medial aspect to proximal nail 1 cm laceration with minimal active bleeding, does not extend into the nail, minimal tenderness, full range of motion present, normal distal sensation capillary refill. Psychiatric: Mood and affect are normal. Speech and behavior are normal. Patient exhibits appropriate insight and judgment   ___________________________________________   LABS (all labs ordered are listed, but only abnormal results are displayed)  Labs Reviewed - No data to display ____________________________________________ ______________________________________   PROCEDURES Procedures  Procedure(s) performed:  Procedure explained and verbal consent obtained. Consent: Verbal consent obtained. Written consent not obtained. Risks and benefits: risks, benefits and alternatives were discussed Patient identity confirmed: verbally with patient and hospital-assigned identification number  Consent given by: patient   Laceration Repair Location: Right index finger Length: 1 cm Foreign bodies: no foreign bodies Tendon involvement: none Nerve involvement: none Preparation: Patient was prepped and draped in the usual sterile fashion. Anesthesia none Cleaned with Betadine Irrigation solution: saline Irrigation method: jet lavage Amount of cleaning: copious Repaired with Dermabond Patient tolerate well. Wound well approximated post repair.   Wound care instructions provided.  Observe for any signs of infection or other problems.     INITIAL IMPRESSION / ASSESSMENT AND PLAN / ED COURSE  Pertinent labs & imaging results that were available during my care of the patient were reviewed by me and considered in my medical decision making (see  chart for details).  Well-appearing patient.  No acute distress.  Right index finger laceration, cleaned and repaired as above.  Encourage keeping clean, dry, supportive care monitoring. Discussed follow up and return parameters including no resolution or any worsening concerns. Patient verbalized understanding and agreed to plan.   ____________________________________________   FINAL CLINICAL IMPRESSION(S) / ED DIAGNOSES  Final diagnoses:  Laceration of right index finger without foreign body without damage to nail, initial encounter     ED Discharge Orders    None       Note: This dictation was prepared with Dragon dictation along with smaller phrase technology. Any transcriptional errors that result from this process are unintentional.         Marylene Land, NP 05/01/19 1217

## 2019-05-01 NOTE — ED Triage Notes (Signed)
Pt presents with c/o laceration to right index finger this morning with a knife. She was cutting an orange. She reports last Tetanus 2012.

## 2019-05-01 NOTE — Discharge Instructions (Signed)
Keep clean and dry. Monitor.   Follow up with your primary care physician this week as needed. Return to Urgent care for new or worsening concerns.

## 2019-05-30 ENCOUNTER — Encounter: Payer: Self-pay | Admitting: Emergency Medicine

## 2019-05-30 ENCOUNTER — Ambulatory Visit (INDEPENDENT_AMBULATORY_CARE_PROVIDER_SITE_OTHER)
Admission: EM | Admit: 2019-05-30 | Discharge: 2019-05-30 | Disposition: A | Discharge status: Home / Self Care | Payer: Medicare Other | Source: Home / Self Care

## 2019-05-30 ENCOUNTER — Telehealth: Payer: Self-pay | Admitting: Internal Medicine

## 2019-05-30 ENCOUNTER — Encounter: Payer: Self-pay | Admitting: *Deleted

## 2019-05-30 ENCOUNTER — Emergency Department
Admission: EM | Admit: 2019-05-30 | Discharge: 2019-05-30 | Disposition: A | Discharge status: Home / Self Care | Payer: Medicare Other | Attending: Emergency Medicine | Admitting: Emergency Medicine

## 2019-05-30 ENCOUNTER — Other Ambulatory Visit: Payer: Self-pay

## 2019-05-30 DIAGNOSIS — I10 Essential (primary) hypertension: Secondary | ICD-10-CM | POA: Diagnosis not present

## 2019-05-30 DIAGNOSIS — Z87891 Personal history of nicotine dependence: Secondary | ICD-10-CM

## 2019-05-30 DIAGNOSIS — Z85828 Personal history of other malignant neoplasm of skin: Secondary | ICD-10-CM | POA: Insufficient documentation

## 2019-05-30 LAB — BASIC METABOLIC PANEL
Anion gap: 7 (ref 5–15)
BUN: 16 mg/dL (ref 8–23)
CO2: 24 mmol/L (ref 22–32)
Calcium: 9.6 mg/dL (ref 8.9–10.3)
Chloride: 107 mmol/L (ref 98–111)
Creatinine, Ser: 0.77 mg/dL (ref 0.44–1.00)
GFR calc Af Amer: >60 mL/min (ref >60)
GFR calc non Af Amer: >60 mL/min (ref >60)
Glucose, Bld: 104 mg/dL — ABNORMAL HIGH (ref 70–99)
Potassium: 3.9 mmol/L (ref 3.5–5.1)
Sodium: 138 mmol/L (ref 135–145)

## 2019-05-30 LAB — CBC
HCT: 44.7 % (ref 36.0–46.0)
Hemoglobin: 14.8 g/dL (ref 12.0–15.0)
MCH: 29.2 pg (ref 26.0–34.0)
MCHC: 33.1 g/dL (ref 30.0–36.0)
MCV: 88.3 fL (ref 80.0–100.0)
Platelets: 226 10*3/uL (ref 150–400)
RBC: 5.06 MIL/uL (ref 3.87–5.11)
RDW: 13.6 % (ref 11.5–15.5)
WBC: 7 10*3/uL (ref 4.0–10.5)
nRBC: 0 % (ref 0.0–0.2)

## 2019-05-30 NOTE — ED Provider Notes (Signed)
[facility] Emergency Department Provider Note   ____________________________________________    I have reviewed the triage vital signs and the nursing notes.   HISTORY  Chief Complaint Hypertension     HPI Melinda Perry is a 74 y.o. female with a history of hypertension who presents with high blood pressure.  Patient reports that she took her blood pressure multiple times today and found to be above [id number] systolic.  She was seen in urgent care.  Around 430 she took 25 mg of losartan, checked her blood pressure several times thereafter and seem to be increasing so she took another losartan, came to the emergency department because of concern for increasing blood pressure.  She is asymptomatic, denies headache to me.  She reports she feels relief now that her triage blood pressure was much better.  She will call her PCP tomorrow  Past Medical History:  Diagnosis Date  . Ankle pain 04/11/2015  . Cancer ([place])    skin ca -squamous cell on face  . High cholesterol   . Hypertension   . PVC (premature ventricular contraction)     Patient Active Problem List   Diagnosis Date Noted  . Aortic atherosclerosis ([place]) 03/08/2019  . Osteopenia determined by x-ray 08/13/2017  . Alopecia areata 07/23/2016  . Heart palpitations 07/23/2016  . Xerosis cutis 04/11/2015  . Mixed hyperlipidemia 03/09/2015  . Tobacco use disorder, moderate, in sustained remission 03/09/2015  . History of vertebral compression fracture 03/09/2015  . History of colon polyps 03/09/2015  . Benign hypertension 03/09/2015  . Microscopic hematuria 03/09/2015    Past Surgical History:  Procedure Laterality Date  . AUGMENTATION MAMMAPLASTY Bilateral 1988  . BREAST ENHANCEMENT SURGERY    . BUNIONECTOMY Bilateral   . CERVICAL CONE BIOPSY  1971   LSIL  . facet shots in back  05/2016   [facility]-   . TUBAL LIGATION      Prior to Admission medications   Medication Sig Start  Date End Date Taking? Authorizing Provider  omeprazole (PRILOSEC) 10 MG capsule Take 10 mg by mouth daily. TEMPERARY- OTC for Keto Diet  05/30/19  [provider]  atorvastatin (LIPITOR) 10 MG tablet TAKE 1 TABLET(10 MG) BY MOUTH DAILY AT 6 PM 06/18/18   Glean Hess, MD  losartan (COZAAR) 25 MG tablet TAKE 1 TABLET(25 MG) BY MOUTH DAILY 06/18/18   Glean Hess, MD  metoprolol succinate (TOPROL-XL) 25 MG 24 hr tablet Take 25 mg by mouth daily. Take 1/2 tablet daily 05/13/17   [provider]  triamcinolone cream (KENALOG) 0.1 % triamcinolone acetonide 0.1 % topical cream 09/14/17   [provider]     Allergies Bupropion  Family History  Problem Relation Age of Onset  . Lung cancer Mother   . Alcohol abuse Mother   . Pulmonary fibrosis Father   . Alcohol abuse Sister   . Breast cancer Neg Hx     Social History Social History   Tobacco Use  . Smoking status: Former Smoker    Packs/day: 0.75    Years: 60.00    Pack years: 45.00    Types: Cigarettes    Quit date: 12/20/2017    Years since quitting: 1.4  . Smokeless tobacco: Never Used  Substance Use Topics  . Alcohol use: Yes    Comment: socially  . Drug use: No    Review of Systems   Cardiovascular: Denies chest pain. Respiratory: Denies shortness of breath.  Musculoskeletal: Negative  for back pain. Skin: Negative for rash. Neurological: No weakness   ____________________________________________   PHYSICAL EXAM:  VITAL SIGNS: ED Triage Vitals  Enc Vitals Group     BP 05/30/19 2013 (!) 169/67     Pulse Rate 05/30/19 2013 67     Resp 05/30/19 2013 20     Temp 05/30/19 2013 98.1 F (36.7 C)     Temp Source 05/30/19 2013 Oral     SpO2 05/30/19 2013 98 %     Weight 05/30/19 2011 81.6 kg (180 lb)     Height 05/30/19 2011 1.676 m (5\' 6" )     Head Circumference --      Peak Flow --      Pain Score --      Pain Loc --      Pain Edu? --      Excl. in [place]? --      Constitutional: Alert and oriented.    Cardiovascular:  Good peripheral circulation. Respiratory: Normal respiratory effort.  No retractions.     Neurologic:  Normal speech and language. No gross focal neurologic deficits are appreciated.  Skin:  Skin is warm, dry and intact. No rash noted. Psychiatric: Mood and affect are normal. Speech and behavior are normal.  ____________________________________________   LABS (all labs ordered are listed, but only abnormal results are displayed)  Labs Reviewed  BASIC METABOLIC PANEL - Abnormal; Notable for the following components:      Result Value   Glucose, Bld 104 (*)    All other components within normal limits  CBC   ____________________________________________  EKG  None ____________________________________________  RADIOLOGY  None ____________________________________________   PROCEDURES  Procedure(s) performed: No  Procedures   Critical Care performed: No ____________________________________________   INITIAL IMPRESSION / ASSESSMENT AND PLAN / ED COURSE  Pertinent labs & imaging results that were available during my care of the patient were reviewed by me and considered in my medical decision making (see chart for details).  Patient well-appearing in no acute distress, lab work is unremarkable.  Blood pressure has improved without intervention, she is anxious to go home, she is asymptomatic, she will follow-up with her PCP tomorrow.    ____________________________________________   FINAL CLINICAL IMPRESSION(S) / ED DIAGNOSES  Final diagnoses:  Essential hypertension        Note:  This document was prepared using Dragon voice recognition software and may include unintentional dictation errors.   Lavonia Drafts, MD 05/30/19 2113

## 2019-05-30 NOTE — ED Triage Notes (Signed)
Pt has hypertension today.  Pt taking bp meds.  Pt has a headache.  No chest pain or sob.  Pt alert  Speech clear.

## 2019-05-30 NOTE — Discharge Instructions (Addendum)
If your blood pressure continues to be high more than 150/90, then take an extra Cozaar 25 mg. And check it again in 2 hours. Follow up with your family Dr Melinda Perry.

## 2019-05-30 NOTE — Telephone Encounter (Signed)
Pt called during lunch and left message. Pt called after lunch. Her bp was running high today 200/90. I contacted CMA and she advise to go to ER

## 2019-05-30 NOTE — ED Provider Notes (Signed)
[facility]    CSN: [id number] Arrival date & time: 05/30/19  1509      History   Chief Complaint Chief Complaint  Patient presents with  . Hypertension    HPI Melinda Perry is a 74 y.o. female. who present worried because her blood pressure is high. Her friend gave her a BP cuff since she did not have one and has had 3 high readings today. She tried to get to see her PCP but they could not see her and told her to go to ER, so she is here. She denies HA, sweating, SOB or CP. Has hx of palpitations with PVC's and PAC's and saw her cardiologist recently and had normal exam and EKG. She tends to get easily anxious and has caused elevation of her BP like today's readings in the past, but then goes down on its own. States she had good sleep last night.     Past Medical History:  Diagnosis Date  . Ankle pain 04/11/2015  . Cancer ([place])    skin ca -squamous cell on face  . High cholesterol   . Hypertension   . PVC (premature ventricular contraction)     Patient Active Problem List   Diagnosis Date Noted  . Aortic atherosclerosis ([place]) 03/08/2019  . Osteopenia determined by x-ray 08/13/2017  . Alopecia areata 07/23/2016  . Heart palpitations 07/23/2016  . Xerosis cutis 04/11/2015  . Mixed hyperlipidemia 03/09/2015  . Tobacco use disorder, moderate, in sustained remission 03/09/2015  . History of vertebral compression fracture 03/09/2015  . History of colon polyps 03/09/2015  . Benign hypertension 03/09/2015  . Microscopic hematuria 03/09/2015    Past Surgical History:  Procedure Laterality Date  . AUGMENTATION MAMMAPLASTY Bilateral 1988  . BREAST ENHANCEMENT SURGERY    . BUNIONECTOMY Bilateral   . CERVICAL CONE BIOPSY  1971   LSIL  . facet shots in back  05/2016   [facility]-   . TUBAL LIGATION      OB History   No obstetric history on file.      Home Medications    Prior to Admission medications   Medication Sig Start Date End Date  Taking? Authorizing Provider  atorvastatin (LIPITOR) 10 MG tablet TAKE 1 TABLET(10 MG) BY MOUTH DAILY AT 6 PM 06/18/18  Yes Glean Hess, MD  losartan (COZAAR) 25 MG tablet TAKE 1 TABLET(25 MG) BY MOUTH DAILY 06/18/18  Yes Glean Hess, MD  metoprolol succinate (TOPROL-XL) 25 MG 24 hr tablet Take 25 mg by mouth daily. Take 1/2 tablet daily 05/13/17  Yes [provider]  omeprazole (PRILOSEC) 10 MG capsule Take 10 mg by mouth daily. TEMPERARY- OTC for Keto Diet  05/30/19 Yes [provider]  triamcinolone cream (KENALOG) 0.1 % triamcinolone acetonide 0.1 % topical cream 09/14/17   [provider]    Family History Family History  Problem Relation Age of Onset  . Lung cancer Mother   . Alcohol abuse Mother   . Pulmonary fibrosis Father   . Alcohol abuse Sister   . Breast cancer Neg Hx     Social History Social History   Tobacco Use  . Smoking status: Former Smoker    Packs/day: 0.75    Years: 60.00    Pack years: 45.00    Types: Cigarettes    Quit date: 12/20/2017    Years since quitting: 1.4  . Smokeless tobacco: Never Used  Substance Use Topics  . Alcohol use: Yes  Comment: socially  . Drug use: No     Allergies   Bupropion   Review of Systems Review of Systems  Constitutional: Negative for chills, diaphoresis, fatigue and fever.  HENT: Negative for tinnitus.   Eyes: Negative for visual disturbance.  Respiratory: Negative for chest tightness and shortness of breath.   Cardiovascular: Negative for chest pain, palpitations and leg swelling.  Gastrointestinal: Negative for nausea.  Musculoskeletal: Negative for gait problem.  Skin: Negative for rash.  Neurological: Negative for dizziness, facial asymmetry, speech difficulty, weakness, light-headedness and headaches.  Psychiatric/Behavioral: Negative for sleep disturbance. The patient is nervous/anxious.    Physical Exam Triage Vital Signs ED Triage Vitals  Enc Vitals Group     BP  05/30/19 1522 (!) 202/90     Pulse Rate 05/30/19 1522 65     Resp 05/30/19 1522 18     Temp 05/30/19 1522 97.7 F (36.5 C)     Temp Source 05/30/19 1522 Oral     SpO2 05/30/19 1522 100 %     Weight 05/30/19 1520 180 lb (81.6 kg)     Height 05/30/19 1520 5\' 6"  (1.676 m)     Head Circumference --      Peak Flow --      Pain Score 05/30/19 1520 0     Pain Loc --      Pain Edu? --      Excl. in [place]? --    No data found.  Updated Vital Signs BP (!) 198/92 (BP Location: Left Arm)   Pulse 65   Temp 97.7 F (36.5 C) (Oral)   Resp 18   Ht 5\' 6"  (1.676 m)   Wt 180 lb (81.6 kg)   SpO2 100%   BMI 29.05 kg/m   Visual Acuity Right Eye Distance:   Left Eye Distance:   Bilateral Distance:    Right Eye Near:   Left Eye Near:    Bilateral Near:     Physical Exam Constitutional: She is oriented to person, place, and time. She appears well-developed and well-nourished. No distress.  HENT:  Head: Normocephalic and atraumatic.  Right Ear: External ear normal.  Left Ear: External ear normal.  Nose: Nose normal.  Eyes: Conjunctivae are normal. Right eye exhibits no discharge. Left eye exhibits no discharge. No scleral icterus.  Neck: Neck supple. No thyromegaly present.  No carotid bruits bilaterally  Cardiovascular: Normal rate and regular rhythm.  No murmur heard. Pulmonary/Chest: Effort normal and breath sounds normal. No respiratory distress.  Musculoskeletal: Normal range of motion. She exhibits no edema.  Lymphadenopathy:    She has no cervical adenopathy.  Neurological: She is alert and oriented to person, place, and time.  Skin: Skin is warm and dry. Capillary refill takes less than 2 seconds. No rash noted. She is not diaphoretic.  Psychiatric: She has a normal mood and affect. Her behavior is normal. Judgment and thought content normal.  Nursing note reviewed.   UC Treatments / Results  Labs (all labs ordered are listed, but only abnormal results are displayed) Labs  Reviewed - No data to display  EKG   Radiology No results found.  Procedures Procedures (including critical care time)  Medications Ordered in UC Medications - No data to display  Initial Impression / Assessment and Plan / UC Course  I have reviewed the triage vital signs and the nursing notes. Pt was asked to lay on her left side and take deep breaths and 10 minutes later her BP was  repeated and it was a little lower than initial arrival. Since she had recent cardiology work up and she is asymptomatic, she was discharge in stable condition.  For other than few PVC's which is not new for pt, her exam is normal. She was advised to continue repeating her BP's after relaxing and if in the next 4 hours stays >150/90, then to take an extra Cozaar. Needs to FU with PCP tomorrow.    Final Clinical Impressions(s) / UC Diagnoses   Final diagnoses:  Essential hypertension     Discharge Instructions     If your blood pressure continues to be high more than 150/90, then take an extra Cozaar 25 mg. And check it again in 2 hours. Follow up with your family Dr Marylene Buerger.     ED Prescriptions    None     PDMP not reviewed this encounter.   Shelby Mattocks, PA-C 05/30/19 1607

## 2019-05-30 NOTE — ED Triage Notes (Addendum)
Patient states her BP has been elevated since today. She states she called her PCP and they told her to go to the ER. Her readings this morning were 202/88, 210/95 and 196/83.

## 2019-05-31 ENCOUNTER — Ambulatory Visit (INDEPENDENT_AMBULATORY_CARE_PROVIDER_SITE_OTHER): Payer: Medicare Other | Admitting: Internal Medicine

## 2019-05-31 ENCOUNTER — Telehealth: Payer: Self-pay | Admitting: Internal Medicine

## 2019-05-31 ENCOUNTER — Encounter: Payer: Self-pay | Admitting: Internal Medicine

## 2019-05-31 ENCOUNTER — Other Ambulatory Visit: Payer: Self-pay

## 2019-05-31 VITALS — BP 138/62 | HR 59 | Temp 97.6°F | Ht 66.0 in | Wt 179.0 lb

## 2019-05-31 DIAGNOSIS — I10 Essential (primary) hypertension: Secondary | ICD-10-CM | POA: Diagnosis not present

## 2019-05-31 MED ORDER — LOSARTAN POTASSIUM 50 MG PO TABS: ORAL_TABLET | ORAL | 1 refills | Status: DC

## 2019-05-31 NOTE — Patient Instructions (Signed)
Increase losartan to 50 mg per day.

## 2019-05-31 NOTE — Progress Notes (Signed)
Date:  05/31/2019   Name:  Melinda Perry [facility]   DOB:  Mar 16, 1946   MRN:  [id number]   Chief Complaint: Hypertension (Follow up from Er yesterday. Pt BP was 202/98 at the urgent care. She was sent to the ER and BP had went down slightly. Here today to follow up on medication.)  Hypertension This is a chronic problem. Progression since onset: she checked bp at home yesterday and it was very high. The problem is controlled. Pertinent negatives include no chest pain, headaches, palpitations or shortness of breath. There are no associated agents (denies cold or sinus medication, diet pills, excess caffeine or salt) to hypertension. Past treatments include angiotensin blockers and beta blockers. The current treatment provides significant improvement. There are no compliance problems.   After UC and then ER visit yesterday, she doubled losartan to 50 mg.  Lab Results  Component Value Date   CREATININE 0.77 05/30/2019   BUN 16 05/30/2019   NA 138 05/30/2019   K 3.9 05/30/2019   CL 107 05/30/2019   CO2 24 05/30/2019   Lab Results  Component Value Date   CHOL 175 08/18/2018   HDL 66 08/18/2018   LDLCALC 92 08/18/2018   TRIG 84 08/18/2018   CHOLHDL 2.7 08/18/2018   Lab Results  Component Value Date   TSH 2.880 08/18/2018   No results found for: HGBA1C   Review of Systems  Constitutional: Negative for chills, fatigue and fever.  Eyes: Negative for visual disturbance.  Respiratory: Negative for cough, chest tightness and shortness of breath.   Cardiovascular: Negative for chest pain, palpitations and leg swelling.  Neurological: Negative for dizziness, light-headedness and headaches.  Psychiatric/Behavioral: Negative for dysphoric mood and sleep disturbance. The patient is not nervous/anxious.     Patient Active Problem List   Diagnosis Date Noted  . Aortic atherosclerosis ([place]) 03/08/2019  . Osteopenia determined by x-ray 08/13/2017  . Alopecia areata 07/23/2016  . Heart  palpitations 07/23/2016  . Xerosis cutis 04/11/2015  . Mixed hyperlipidemia 03/09/2015  . Tobacco use disorder, moderate, in sustained remission 03/09/2015  . History of vertebral compression fracture 03/09/2015  . History of colon polyps 03/09/2015  . Benign hypertension 03/09/2015  . Microscopic hematuria 03/09/2015    Allergies  Allergen Reactions  . Bupropion Other (See Comments)    Hair loss    Past Surgical History:  Procedure Laterality Date  . AUGMENTATION MAMMAPLASTY Bilateral 1988  . BREAST ENHANCEMENT SURGERY    . BUNIONECTOMY Bilateral   . CERVICAL CONE BIOPSY  1971   LSIL  . facet shots in back  05/2016   [facility]-   . TUBAL LIGATION      Social History   Tobacco Use  . Smoking status: Former Smoker    Packs/day: 0.75    Years: 60.00    Pack years: 45.00    Types: Cigarettes    Quit date: 12/20/2017    Years since quitting: 1.4  . Smokeless tobacco: Never Used  Substance Use Topics  . Alcohol use: Yes    Comment: socially  . Drug use: No     Medication list has been reviewed and updated.  Current Meds  Medication Sig  . atorvastatin (LIPITOR) 10 MG tablet TAKE 1 TABLET(10 MG) BY MOUTH DAILY AT 6 PM  . doxycycline (PERIOSTAT) 20 MG tablet Take 20 mg by mouth 2 (two) times daily.  Marland Kitchen losartan (COZAAR) 25 MG tablet TAKE 1 TABLET(25 MG) BY MOUTH DAILY  . metoprolol succinate (  TOPROL-XL) 25 MG 24 hr tablet Take 25 mg by mouth daily. Take 1/2 tablet daily    PHQ 2/9 Scores 05/31/2019 03/08/2019 08/18/2018 08/09/2018  PHQ - 2 Score 0 0 0 0  PHQ- 9 Score 0 - - -    BP Readings from Last 3 Encounters:  05/31/19 138/62  05/30/19 (!) 169/67  05/30/19 (!) 198/92    Physical Exam Vitals and nursing note reviewed.  Constitutional:      General: She is not in acute distress.    Appearance: Normal appearance. She is well-developed.  HENT:     Head: Normocephalic and atraumatic.  Cardiovascular:     Rate and Rhythm: Normal rate and regular rhythm.       Pulses: Normal pulses.  Pulmonary:     Effort: Pulmonary effort is normal. No respiratory distress.     Breath sounds: No wheezing or rhonchi.  Musculoskeletal:        General: Normal range of motion.     Cervical back: Normal range of motion.     Right lower leg: No edema.     Left lower leg: No edema.  Skin:    General: Skin is warm and dry.     Findings: No rash.  Neurological:     Mental Status: She is alert and oriented to person, place, and time.  Psychiatric:        Behavior: Behavior normal.        Thought Content: Thought content normal.     Wt Readings from Last 3 Encounters:  05/31/19 179 lb (81.2 kg)  05/30/19 180 lb (81.6 kg)  05/30/19 180 lb (81.6 kg)    BP 138/62 (BP Location: Right Arm, Patient Position: Sitting, Cuff Size: Large)   Pulse (!) 59   Temp 97.6 F (36.4 C) (Temporal)   Ht 5\' 6"  (1.676 m)   Wt 179 lb (81.2 kg)   SpO2 98%   BMI 28.89 kg/m   Assessment and Plan: 1. Benign hypertension BP appears improved today - unsure why it was elevated yesterday but suspect the high reading then then leaking water heater all contributed to anxiety and stress reaction. Labs at ER reassuring. Continue losartan 50 mg - call for new Rx when needed Continue metoprolol   Partially dictated using Editor, commissioning. Any errors are unintentional.  Halina Maidens, MD [facility]  05/31/2019

## 2019-05-31 NOTE — Telephone Encounter (Signed)
Pt needs refill sent in to [facility], did want to remind the dose was changed to 50 mg.      losartan (COZAAR) 25 MG tablet [id number]

## 2019-06-13 ENCOUNTER — Other Ambulatory Visit: Payer: Self-pay | Admitting: Internal Medicine

## 2019-06-13 DIAGNOSIS — I10 Essential (primary) hypertension: Secondary | ICD-10-CM

## 2019-06-13 DIAGNOSIS — H04123 Dry eye syndrome of bilateral lacrimal glands: Secondary | ICD-10-CM | POA: Diagnosis not present

## 2019-06-13 DIAGNOSIS — H40053 Ocular hypertension, bilateral: Secondary | ICD-10-CM | POA: Diagnosis not present

## 2019-06-13 DIAGNOSIS — H0288A Meibomian gland dysfunction right eye, upper and lower eyelids: Secondary | ICD-10-CM | POA: Diagnosis not present

## 2019-06-13 DIAGNOSIS — E7849 Other hyperlipidemia: Secondary | ICD-10-CM

## 2019-06-13 DIAGNOSIS — H0288B Meibomian gland dysfunction left eye, upper and lower eyelids: Secondary | ICD-10-CM | POA: Diagnosis not present

## 2019-06-20 DIAGNOSIS — H04123 Dry eye syndrome of bilateral lacrimal glands: Secondary | ICD-10-CM | POA: Diagnosis not present

## 2019-07-26 DIAGNOSIS — H04123 Dry eye syndrome of bilateral lacrimal glands: Secondary | ICD-10-CM | POA: Diagnosis not present

## 2019-08-10 ENCOUNTER — Ambulatory Visit (INDEPENDENT_AMBULATORY_CARE_PROVIDER_SITE_OTHER): Payer: Medicare Other

## 2019-08-10 DIAGNOSIS — Z0000 Encounter for general adult medical examination without abnormal findings: Secondary | ICD-10-CM

## 2019-08-10 DIAGNOSIS — Z1231 Encounter for screening mammogram for malignant neoplasm of breast: Secondary | ICD-10-CM

## 2019-08-10 DIAGNOSIS — M8580 Other specified disorders of bone density and structure, unspecified site: Secondary | ICD-10-CM | POA: Diagnosis not present

## 2019-08-10 DIAGNOSIS — Z780 Asymptomatic menopausal state: Secondary | ICD-10-CM

## 2019-08-10 NOTE — Progress Notes (Signed)
Subjective:   Melinda Perry is a 74 y.o. female who presents for Medicare Annual (Subsequent) preventive examination.  Virtual Visit via Telephone Note  I connected with  Melinda Perry on 08/10/19 at 10:00 AM EDT by telephone and verified that I am speaking with the correct person using two identifiers.  Medicare Annual Wellness visit completed telephonically due to Covid-19 pandemic.   Location: Patient: home Provider: office   I discussed the limitations, risks, security and privacy concerns of performing an evaluation and management service by telephone and the availability of in person appointments. The patient expressed understanding and agreed to proceed.  Unable to perform video visit due to patient does not have video capability.   Some vital signs may be absent or patient reported.   [name], LPN   Review of Systems:   Cardiac Risk Factors include: advanced age (>24men, >61 women);dyslipidemia;hypertension     Objective:     Vitals: There were no vitals taken for this visit.  There is no height or weight on file to calculate BMI.  Advanced Directives 05/30/2019 08/09/2018 08/05/2017 01/26/2017 07/23/2016 05/10/2015 04/11/2015  Does Patient Have a Medical Advance Directive? No Yes Yes No Yes Yes Yes  Type of Advance Directive - Living will;Healthcare Power of [place];Living will - Healthcare Power of [place];Living will [place];Living will  Copy of [place] in Chart? - Yes - validated most recent copy scanned in chart (See row information) No - copy requested - No - copy requested - -    Tobacco Social History   Tobacco Use  Smoking Status Former Smoker  . Packs/day: 0.75  . Years: 60.00  . Pack years: 45.00  . Types: Cigarettes  . Quit date: 12/20/2017  . Years since quitting: 1.6  Smokeless Tobacco Never Used     Counseling given: Not  Answered   Clinical Intake:  Pre-visit preparation completed: Yes  Pain : No/denies pain     Nutritional Risks: None Diabetes: No  How often do you need to have someone help you when you read instructions, pamphlets, or other written materials from your doctor or pharmacy?: 1 - Never  Interpreter Needed?: No  Information entered by :: [name] LPN  Past Medical History:  Diagnosis Date  . Ankle pain 04/11/2015  . Cancer ([place])    skin ca -squamous cell on face  . High cholesterol   . Hypertension   . PVC (premature ventricular contraction)    Past Surgical History:  Procedure Laterality Date  . AUGMENTATION MAMMAPLASTY Bilateral 1988  . BREAST ENHANCEMENT SURGERY    . BUNIONECTOMY Bilateral   . CERVICAL CONE BIOPSY  1971   LSIL  . EYE SURGERY  Lens replacement  . facet shots in back  05/2016   [facility]-   . TUBAL LIGATION     Family History  Problem Relation Age of Onset  . Lung cancer Mother   . Alcohol abuse Mother   . Cancer Mother   . Pulmonary fibrosis Father   . Alcohol abuse Sister   . Breast cancer Neg Hx    Social History   Socioeconomic History  . Marital status: Divorced    Spouse name: Not on file  . Number of children: 2  . Years of education: some college  . Highest education level: 12th grade  Occupational History  . Occupation: Retired  Tobacco Use  . Smoking status: Former Smoker  Packs/day: 0.75    Years: 60.00    Pack years: 45.00    Types: Cigarettes    Quit date: 12/20/2017    Years since quitting: 1.6  . Smokeless tobacco: Never Used  Substance and Sexual Activity  . Alcohol use: Yes    Comment: 2 drinks or less/month  . Drug use: No  . Sexual activity: Not Currently    Birth control/protection: None  Other Topics Concern  . Not on file  Social History Narrative  . Not on file   Social Determinants of Health   Financial Resource Strain: Low Risk   . Difficulty of Paying Living Expenses: Not hard at all   Food Insecurity: No Food Insecurity  . Worried About Charity fundraiser in the Last Year: Never true  . Ran Out of Food in the Last Year: Never true  Transportation Needs: No Transportation Needs  . Lack of Transportation (Medical): No  . Lack of Transportation (Non-Medical): No  Physical Activity: Sufficiently Active  . Days of Exercise per Week: 7 days  . Minutes of Exercise per Session: 40 min  Stress: No Stress Concern Present  . Feeling of Stress : Not at all  Social Connections: Moderately Isolated  . Frequency of Communication with Friends and Family: More than three times a week  . Frequency of Social Gatherings with Friends and Family: More than three times a week  . Attends Religious Services: Never  . Active Member of Clubs or Organizations: No  . Attends Archivist Meetings: Never  . Marital Status: Divorced    Outpatient Encounter Medications as of 08/10/2019  Medication Sig  . atorvastatin (LIPITOR) 10 MG tablet TAKE 1 TABLET(10 MG) BY MOUTH DAILY AT 6 PM  . doxycycline (VIBRA-TABS) 100 MG tablet Take 100 mg by mouth daily.  Marland Kitchen losartan (COZAAR) 50 MG tablet TAKE 1 TABLET BY MOUTH DAILY  . metoprolol succinate (TOPROL-XL) 25 MG 24 hr tablet Take 25 mg by mouth daily. Take 1/2 tablet daily  . triamcinolone cream (KENALOG) 0.1 % triamcinolone acetonide 0.1 % topical cream  . [DISCONTINUED] doxycycline (PERIOSTAT) 20 MG tablet Take 20 mg by mouth 2 (two) times daily.  . [DISCONTINUED] losartan (COZAAR) 25 MG tablet TAKE 1 TABLET(25 MG) BY MOUTH DAILY  . [DISCONTINUED] omeprazole (PRILOSEC) 10 MG capsule Take 10 mg by mouth daily. TEMPERARY- OTC for Keto Diet   No facility-administered encounter medications on file as of 08/10/2019.    Activities of Daily Living In your present state of health, do you have any difficulty performing the following activities: 08/10/2019  Hearing? N  Comment declines hearing aids  Vision? N  Difficulty concentrating or making  decisions? N  Walking or climbing stairs? N  Dressing or bathing? N  Doing errands, shopping? N  Preparing Food and eating ? N  Using the Toilet? N  In the past six months, have you accidently leaked urine? N  Do you have problems with loss of bowel control? N  Managing your Medications? N  Managing your Finances? N  Housekeeping or managing your Housekeeping? N  Some recent data might be hidden    Patient Care Team: Glean Hess, MD as PCP - General (Internal Medicine) Isaias Cowman, MD as Consulting Physician (Cardiology) Dasher, Rayvon Char, MD as Consulting Physician (Dermatology) [facility] (Pain Medicine)    Assessment:   This is a routine wellness examination for Melinda Perry.  Exercise Activities and Dietary recommendations Current Exercise Habits: Home exercise routine, Type of  exercise: walking, Time (Minutes): 40, Frequency (Times/Week): 7, Weekly Exercise (Minutes/Week): 280, Intensity: Moderate, Exercise limited by: None identified  Goals    . DIET - INCREASE WATER INTAKE     Recommend to drink at least 6-8 8oz glasses of water per day.    . Patient Stated     Pt states she would like to maintain weight since quitting smoking.     . Weight < 200 lb (90.719 kg)       Fall Risk Fall Risk  08/10/2019 08/18/2018 08/09/2018 08/05/2017 07/23/2016  Falls in the past year? 0 0 0 No No  Number falls in past yr: 0 0 0 - -  Injury with Fall? 0 0 0 - -  Risk for fall due to : No Fall Risks - - Impaired vision -  Risk for fall due to: Comment - - - R eyelid obstructing visual field -  Follow up Falls prevention discussed Falls evaluation completed Falls prevention discussed - -   FALL RISK PREVENTION PERTAINING TO THE HOME:  Any stairs in or around the home? Yes  If so, do they handrails? Yes   Home free of loose throw rugs in walkways, pet beds, electrical cords, etc? Yes  Adequate lighting in your home to reduce risk of falls? Yes   ASSISTIVE DEVICES UTILIZED  TO PREVENT FALLS:  Life alert? No  Use of a cane, walker or w/c? No  Grab bars in the bathroom? Yes  Shower chair or bench in shower? No  Elevated toilet seat or a handicapped toilet? No   DME ORDERS:  DME order needed?  No   TIMED UP AND GO:  Was the test performed? No . Telephonic visit.  Education: Fall risk prevention has been discussed.  Intervention(s) required? No   Depression Screen PHQ 2/9 Scores 08/10/2019 05/31/2019 03/08/2019 08/18/2018  PHQ - 2 Score 0 0 0 0  PHQ- 9 Score - 0 - -     Cognitive Function     6CIT Screen 08/09/2018 08/05/2017 07/23/2016  What Year? 0 points 0 points 0 points  What month? 0 points 0 points 0 points  What time? 0 points 0 points 0 points  Count back from 20 0 points 0 points 0 points  Months in reverse 0 points 0 points 0 points  Repeat phrase 0 points 0 points 0 points  Total Score 0 0 0    Immunization History  Administered Date(s) Administered  . Fluad Quad(high Dose 65+) 12/16/2018  . Influenza Split 01/29/2009, 03/31/2009, 01/07/2011  . Influenza, High Dose Seasonal PF 01/06/2017  . Influenza-Unspecified 12/30/2014, 01/13/2016, 12/20/2017  . PFIZER SARS-COV-2 Vaccination 04/20/2019, 05/14/2019  . Pneumococcal Conjugate-13 11/16/2013  . Pneumococcal Polysaccharide-23 07/29/2009, 04/01/2010  . Tdap 04/01/2010, 01/07/2011  . Zoster 04/01/2010  . Zoster Recombinat (Shingrix) 07/24/2016, 11/24/2016    Qualifies for Shingles Vaccine?Yes  Shingrix series completed   Tdap: Up to date  Flu Vaccine: Up to date  Pneumococcal Vaccine: Up to date  Covid-19 Vaccine: Up to date   Screening Tests Health Maintenance  Topic Date Due  . MAMMOGRAM  09/15/2019  . INFLUENZA VACCINE  10/30/2019  . TETANUS/TDAP  01/06/2021  . COLONOSCOPY  12/03/2022  . DEXA SCAN  Completed  . COVID-19 Vaccine  Completed  . PNA vac Low Risk Adult  Completed  . Hepatitis C Screening  Addressed    Cancer Screenings:  Colorectal Screening:  Completed 12/02/12. Repeat every 10 years;   Mammogram: Completed 09/15/18. Repeat every year.  Ordered today. Pt provided with contact information and advised to call to schedule appt.   Bone Density: Completed 08/11/17. Results reflect  OSTEOPENIA. Repeat every 2 years. Ordered today. Pt provided with contact information and advised to call to schedule appt.   Lung Cancer Screening: (Low Dose CT Chest recommended if Age 7-80 years, 30 pack-year currently smoking OR have quit w/in 15years.) does qualify. Completed 10/07/18.    Additional Screening:  Hepatitis C Screening: does qualify; Completed 04/01/13  Vision Screening: Recommended annual ophthalmology exams for early detection of glaucoma and other disorders of the eye. Is the patient up to date with their annual eye exam?  Yes  Who is the provider or what is the name of the office in which the pt attends annual eye exams? [facility]   Dental Screening: Recommended annual dental exams for proper oral hygiene  Community Resource Referral:  CRR required this visit?  No      Plan:     I have personally reviewed and addressed the Medicare Annual Wellness questionnaire and have noted the following in the patient's chart:  A. Medical and social history B. Use of alcohol, tobacco or illicit drugs  C. Current medications and supplements D. Functional ability and status E.  Nutritional status F.  Physical activity G. Advance directives H. List of other physicians I.  Hospitalizations, surgeries, and ER visits in previous 12 months J.  [place] such as hearing and vision if needed, cognitive and depression L. Referrals and appointments   In addition, I have reviewed and discussed with patient certain preventive protocols, quality metrics, and best practice recommendations. A written personalized care plan for preventive services as well as general preventive health recommendations were provided to  patient.   Signed,  [name], LPN Nurse Health Advisor   Nurse Notes: none

## 2019-08-10 NOTE — Patient Instructions (Signed)
Melinda Perry , Thank you for taking time to come for your Medicare Wellness Visit. I appreciate your ongoing commitment to your health goals. Please review the following plan we discussed and let me know if I can assist you in the future.   Screening recommendations/referrals: Colonoscopy: done 12/02/12 Mammogram: done 09/15/18. Please call [phone] to schedule your mammogram and bone density screening.  Bone Density: done 08/11/17 Recommended yearly ophthalmology/optometry visit for glaucoma screening and checkup Recommended yearly dental visit for hygiene and checkup  Vaccinations: Influenza vaccine: done 12/16/18 Pneumococcal vaccine: done 11/16/13 Tdap vaccine: done 01/07/11 Shingles vaccine: done 07/24/16 & 11/24/16   Covid-19: done 04/20/19 7 05/14/19  Conditions/risks identified: Keep up the great work!  Next appointment: Please follow up in one year for your Medicare Annual Wellness visit.     Preventive Care 80 Years and Older, Female Preventive care refers to lifestyle choices and visits with your health care provider that can promote health and wellness. What does preventive care include?  A yearly physical exam. This is also called an annual well check.  Dental exams once or twice a year.  Routine eye exams. Ask your health care provider how often you should have your eyes checked.  Personal lifestyle choices, including:  Daily care of your teeth and gums.  Regular physical activity.  Eating a healthy diet.  Avoiding tobacco and drug use.  Limiting alcohol use.  Practicing safe sex.  Taking low-dose aspirin every day.  Taking vitamin and mineral supplements as recommended by your health care provider. What happens during an annual well check? The services and screenings done by your health care provider during your annual well check will depend on your age, overall health, lifestyle risk factors, and family history of disease. Counseling  Your health care  provider may ask you questions about your:  Alcohol use.  Tobacco use.  Drug use.  Emotional well-being.  Home and relationship well-being.  Sexual activity.  Eating habits.  History of falls.  Memory and ability to understand (cognition).  Work and work Statistician.  Reproductive health. Screening  You may have the following tests or measurements:  Height, weight, and BMI.  Blood pressure.  Lipid and cholesterol levels. These may be checked every 5 years, or more frequently if you are over 48 years old.  Skin check.  Lung cancer screening. You may have this screening every year starting at age 18 if you have a 30-pack-year history of smoking and currently smoke or have quit within the past 15 years.  Fecal occult blood test (FOBT) of the stool. You may have this test every year starting at age 45.  Flexible sigmoidoscopy or colonoscopy. You may have a sigmoidoscopy every 5 years or a colonoscopy every 10 years starting at age 66.  Hepatitis C blood test.  Hepatitis B blood test.  Sexually transmitted disease (STD) testing.  Diabetes screening. This is done by checking your blood sugar (glucose) after you have not eaten for a while (fasting). You may have this done every 1-3 years.  Bone density scan. This is done to screen for osteoporosis. You may have this done starting at age 5.  Mammogram. This may be done every 1-2 years. Talk to your health care provider about how often you should have regular mammograms. Talk with your health care provider about your test results, treatment options, and if necessary, the need for more tests. Vaccines  Your health care provider may recommend certain vaccines, such as:  Influenza vaccine. This  is recommended every year.  Tetanus, diphtheria, and acellular pertussis (Tdap, Td) vaccine. You may need a Td booster every 10 years.  Zoster vaccine. You may need this after age 23.  Pneumococcal 13-valent conjugate (PCV13)  vaccine. One dose is recommended after age 79.  Pneumococcal polysaccharide (PPSV23) vaccine. One dose is recommended after age 48. Talk to your health care provider about which screenings and vaccines you need and how often you need them. This information is not intended to replace advice given to you by your health care provider. Make sure you discuss any questions you have with your health care provider. Document Released: 04/13/2015 Document Revised: 12/05/2015 Document Reviewed: 01/16/2015 Elsevier Interactive Patient Education  2017 [place] Prevention in the Home Falls can cause injuries. They can happen to people of all ages. There are many things you can do to make your home safe and to help prevent falls. What can I do on the outside of my home?  Regularly fix the edges of walkways and driveways and fix any cracks.  Remove anything that might make you trip as you walk through a door, such as a raised step or threshold.  Trim any bushes or trees on the path to your home.  Use bright outdoor lighting.  Clear any walking paths of anything that might make someone trip, such as rocks or tools.  Regularly check to see if handrails are loose or broken. Make sure that both sides of any steps have handrails.  Any raised decks and porches should have guardrails on the edges.  Have any leaves, snow, or ice cleared regularly.  Use sand or salt on walking paths during winter.  Clean up any spills in your garage right away. This includes oil or grease spills. What can I do in the bathroom?  Use night lights.  Install grab bars by the toilet and in the tub and shower. Do not use towel bars as grab bars.  Use non-skid mats or decals in the tub or shower.  If you need to sit down in the shower, use a plastic, non-slip stool.  Keep the floor dry. Clean up any water that spills on the floor as soon as it happens.  Remove soap buildup in the tub or shower  regularly.  Attach bath mats securely with double-sided non-slip rug tape.  Do not have throw rugs and other things on the floor that can make you trip. What can I do in the bedroom?  Use night lights.  Make sure that you have a light by your bed that is easy to reach.  Do not use any sheets or blankets that are too big for your bed. They should not hang down onto the floor.  Have a firm chair that has side arms. You can use this for support while you get dressed.  Do not have throw rugs and other things on the floor that can make you trip. What can I do in the kitchen?  Clean up any spills right away.  Avoid walking on wet floors.  Keep items that you use a lot in easy-to-reach places.  If you need to reach something above you, use a strong step stool that has a grab bar.  Keep electrical cords out of the way.  Do not use floor polish or wax that makes floors slippery. If you must use wax, use non-skid floor wax.  Do not have throw rugs and other things on the floor that can make you  trip. What can I do with my stairs?  Do not leave any items on the stairs.  Make sure that there are handrails on both sides of the stairs and use them. Fix handrails that are broken or loose. Make sure that handrails are as long as the stairways.  Check any carpeting to make sure that it is firmly attached to the stairs. Fix any carpet that is loose or worn.  Avoid having throw rugs at the top or bottom of the stairs. If you do have throw rugs, attach them to the floor with carpet tape.  Make sure that you have a light switch at the top of the stairs and the bottom of the stairs. If you do not have them, ask someone to add them for you. What else can I do to help prevent falls?  Wear shoes that:  Do not have high heels.  Have rubber bottoms.  Are comfortable and fit you well.  Are closed at the toe. Do not wear sandals.  If you use a stepladder:  Make sure that it is fully  opened. Do not climb a closed stepladder.  Make sure that both sides of the stepladder are locked into place.  Ask someone to hold it for you, if possible.  Clearly mark and make sure that you can see:  Any grab bars or handrails.  First and last steps.  Where the edge of each step is.  Use tools that help you move around (mobility aids) if they are needed. These include:  Canes.  Walkers.  Scooters.  Crutches.  Turn on the lights when you go into a dark area. Replace any light bulbs as soon as they burn out.  Set up your furniture so you have a clear path. Avoid moving your furniture around.  If any of your floors are uneven, fix them.  If there are any pets around you, be aware of where they are.  Review your medicines with your doctor. Some medicines can make you feel dizzy. This can increase your chance of falling. Ask your doctor what other things that you can do to help prevent falls. This information is not intended to replace advice given to you by your health care provider. Make sure you discuss any questions you have with your health care provider. Document Released: 01/11/2009 Document Revised: 08/23/2015 Document Reviewed: 04/21/2014 Elsevier Interactive Patient Education  2017 [employer].

## 2019-08-15 DIAGNOSIS — H40053 Ocular hypertension, bilateral: Secondary | ICD-10-CM | POA: Diagnosis not present

## 2019-08-15 DIAGNOSIS — H04123 Dry eye syndrome of bilateral lacrimal glands: Secondary | ICD-10-CM | POA: Diagnosis not present

## 2019-08-15 DIAGNOSIS — H0288A Meibomian gland dysfunction right eye, upper and lower eyelids: Secondary | ICD-10-CM | POA: Diagnosis not present

## 2019-08-15 DIAGNOSIS — H0288B Meibomian gland dysfunction left eye, upper and lower eyelids: Secondary | ICD-10-CM | POA: Diagnosis not present

## 2019-08-18 DIAGNOSIS — H0288A Meibomian gland dysfunction right eye, upper and lower eyelids: Secondary | ICD-10-CM | POA: Diagnosis not present

## 2019-08-18 DIAGNOSIS — H40053 Ocular hypertension, bilateral: Secondary | ICD-10-CM | POA: Diagnosis not present

## 2019-08-18 DIAGNOSIS — H0288B Meibomian gland dysfunction left eye, upper and lower eyelids: Secondary | ICD-10-CM | POA: Diagnosis not present

## 2019-08-18 DIAGNOSIS — H04123 Dry eye syndrome of bilateral lacrimal glands: Secondary | ICD-10-CM | POA: Diagnosis not present

## 2019-08-19 ENCOUNTER — Encounter: Payer: Medicare Other | Admitting: Internal Medicine

## 2019-08-24 ENCOUNTER — Other Ambulatory Visit: Payer: Self-pay

## 2019-08-24 ENCOUNTER — Encounter: Payer: Self-pay | Admitting: Internal Medicine

## 2019-08-24 ENCOUNTER — Other Ambulatory Visit
Admission: RE | Admit: 2019-08-24 | Discharge: 2019-08-24 | Disposition: A | Discharge status: Home / Self Care | Payer: Medicare Other | Attending: Internal Medicine | Admitting: Internal Medicine

## 2019-08-24 ENCOUNTER — Ambulatory Visit (INDEPENDENT_AMBULATORY_CARE_PROVIDER_SITE_OTHER): Payer: Medicare Other | Admitting: Internal Medicine

## 2019-08-24 VITALS — BP 128/60 | HR 61 | Temp 97.7°F | Ht 66.0 in | Wt 171.0 lb

## 2019-08-24 DIAGNOSIS — H10823 Rosacea conjunctivitis, bilateral: Secondary | ICD-10-CM

## 2019-08-24 DIAGNOSIS — E782 Mixed hyperlipidemia: Secondary | ICD-10-CM | POA: Insufficient documentation

## 2019-08-24 DIAGNOSIS — L718 Other rosacea: Secondary | ICD-10-CM

## 2019-08-24 DIAGNOSIS — Z0000 Encounter for general adult medical examination without abnormal findings: Secondary | ICD-10-CM

## 2019-08-24 DIAGNOSIS — F17201 Nicotine dependence, unspecified, in remission: Secondary | ICD-10-CM

## 2019-08-24 DIAGNOSIS — I700 Atherosclerosis of aorta: Secondary | ICD-10-CM

## 2019-08-24 DIAGNOSIS — I10 Essential (primary) hypertension: Secondary | ICD-10-CM | POA: Diagnosis not present

## 2019-08-24 LAB — POCT URINALYSIS DIPSTICK
Bilirubin, UA: NEGATIVE
Glucose, UA: NEGATIVE
Ketones, UA: NEGATIVE
Nitrite, UA: NEGATIVE
Protein, UA: NEGATIVE
Spec Grav, UA: 1.02 (ref 1.010–1.025)
Urobilinogen, UA: 0.2 E.U./dL
pH, UA: 5 (ref 5.0–8.0)

## 2019-08-24 LAB — COMPREHENSIVE METABOLIC PANEL
ALT: 22 U/L (ref 0–44)
AST: 27 U/L (ref 15–41)
Albumin: 4.4 g/dL (ref 3.5–5.0)
Alkaline Phosphatase: 57 U/L (ref 38–126)
Anion gap: 10 (ref 5–15)
BUN: 17 mg/dL (ref 8–23)
CO2: 21 mmol/L — ABNORMAL LOW (ref 22–32)
Calcium: 9.3 mg/dL (ref 8.9–10.3)
Chloride: 97 mmol/L — ABNORMAL LOW (ref 98–111)
Creatinine, Ser: 0.76 mg/dL (ref 0.44–1.00)
GFR calc Af Amer: >60 mL/min (ref >60)
GFR calc non Af Amer: >60 mL/min (ref >60)
Glucose, Bld: 114 mg/dL — ABNORMAL HIGH (ref 70–99)
Potassium: 4.4 mmol/L (ref 3.5–5.1)
Sodium: 128 mmol/L — ABNORMAL LOW (ref 135–145)
Total Bilirubin: 1.2 mg/dL (ref 0.3–1.2)
Total Protein: 7.9 g/dL (ref 6.5–8.1)

## 2019-08-24 LAB — CBC WITH DIFFERENTIAL/PLATELET
Abs Immature Granulocytes: 0.02 10*3/uL (ref 0.00–0.07)
Basophils Absolute: 0 10*3/uL (ref 0.0–0.1)
Basophils Relative: 0 %
Eosinophils Absolute: 0 10*3/uL (ref 0.0–0.5)
Eosinophils Relative: 0 %
HCT: 43.4 % (ref 36.0–46.0)
Hemoglobin: 14.6 g/dL (ref 12.0–15.0)
Immature Granulocytes: 0 %
Lymphocytes Relative: 23 %
Lymphs Abs: 1.4 10*3/uL (ref 0.7–4.0)
MCH: 29.5 pg (ref 26.0–34.0)
MCHC: 33.6 g/dL (ref 30.0–36.0)
MCV: 87.7 fL (ref 80.0–100.0)
Monocytes Absolute: 0.4 10*3/uL (ref 0.1–1.0)
Monocytes Relative: 7 %
Neutro Abs: 4.1 10*3/uL (ref 1.7–7.7)
Neutrophils Relative %: 70 %
Platelets: 203 10*3/uL (ref 150–400)
RBC: 4.95 MIL/uL (ref 3.87–5.11)
RDW: 13.5 % (ref 11.5–15.5)
WBC: 6 10*3/uL (ref 4.0–10.5)
nRBC: 0 % (ref 0.0–0.2)

## 2019-08-24 LAB — LIPID PANEL
Cholesterol: 133 mg/dL (ref 0–200)
HDL: 59 mg/dL (ref >40)
LDL Cholesterol: 65 mg/dL (ref 0–99)
Total CHOL/HDL Ratio: 2.3 RATIO
Triglycerides: 46 mg/dL (ref <150)
VLDL: 9 mg/dL (ref 0–40)

## 2019-08-24 LAB — TSH: TSH: 2.483 u[IU]/mL (ref 0.350–4.500)

## 2019-08-24 NOTE — Progress Notes (Signed)
Date:  08/24/2019   Name:  Melinda Perry [facility]   DOB:  [birth year]   MRN:  [id number]   Chief Complaint: Annual Exam (Breast Exam. No pap- aged out. ) Melinda Perry is a 74 y.o. female who presents today for her Complete Annual Exam. She feels well. She reports exercising walking. She reports she is sleeping poorly for the past year - wakes early. Had MAW last week with [facility].  Mammogram scheduled 09/19/19 DEXA scheduled Colonoscopy  11/2012 Immunization History  Administered Date(s) Administered  . Fluad Quad(high Dose 65+) 12/16/2018  . Influenza Split 01/29/2009, 03/31/2009, 01/07/2011  . Influenza, High Dose Seasonal PF 01/06/2017  . Influenza-Unspecified 12/30/2014, 01/13/2016, 12/20/2017  . PFIZER SARS-COV-2 Vaccination 04/20/2019, 05/14/2019  . Pneumococcal Conjugate-13 11/16/2013  . Pneumococcal Polysaccharide-23 07/29/2009, 04/01/2010  . Tdap 04/01/2010, 01/07/2011  . Zoster 04/01/2010  . Zoster Recombinat (Shingrix) 07/24/2016, 11/24/2016    Hypertension The problem is controlled. Pertinent negatives include no chest pain, headaches, palpitations or shortness of breath. Past treatments include beta blockers and angiotensin blockers. The current treatment provides significant improvement. There is no history of kidney disease, CAD/MI or CVA.  Hyperlipidemia The problem is controlled. Pertinent negatives include no chest pain or shortness of breath. Current antihyperlipidemic treatment includes statins. The current treatment provides significant improvement of lipids. There are no compliance problems.  Risk factors for coronary artery disease include hypertension.  Tobacco use - quit several years ago.  Has had LDCT screening which was negative for malignancy.  It did show mild centrilobular emphysema and aortic atherosclerosis.  Lab Results  Component Value Date   CREATININE 0.77 05/30/2019   BUN 16 05/30/2019   NA 138 05/30/2019   K 3.9 05/30/2019   CL 107  05/30/2019   CO2 24 05/30/2019   Lab Results  Component Value Date   CHOL 175 08/18/2018   HDL 66 08/18/2018   LDLCALC 92 08/18/2018   TRIG 84 08/18/2018   CHOLHDL 2.7 08/18/2018   Lab Results  Component Value Date   TSH 2.880 08/18/2018   No results found for: HGBA1C Lab Results  Component Value Date   WBC 7.0 05/30/2019   HGB 14.8 05/30/2019   HCT 44.7 05/30/2019   MCV 88.3 05/30/2019   PLT 226 05/30/2019   Lab Results  Component Value Date   ALT 26 08/18/2018   AST 27 08/18/2018   ALKPHOS 76 08/18/2018   BILITOT 0.5 08/18/2018     Review of Systems  Constitutional: Negative for chills, fatigue and fever.  HENT: Negative for congestion, hearing loss, tinnitus, trouble swallowing and voice change.   Eyes: Positive for redness (treated with drops and Doxycycline). Negative for visual disturbance.  Respiratory: Negative for cough, chest tightness, shortness of breath and wheezing.   Cardiovascular: Negative for chest pain, palpitations and leg swelling.  Gastrointestinal: Negative for abdominal pain, constipation, diarrhea and vomiting.  Endocrine: Negative for polydipsia and polyuria.  Genitourinary: Negative for dysuria, frequency, genital sores, vaginal bleeding and vaginal discharge.  Musculoskeletal: Negative for arthralgias, gait problem and joint swelling.  Skin: Negative for color change and rash.  Neurological: Negative for dizziness, tremors, light-headedness and headaches.  Hematological: Negative for adenopathy. Does not bruise/bleed easily.  Psychiatric/Behavioral: Negative for dysphoric mood and sleep disturbance (wakes early). The patient is not nervous/anxious.     Patient Active Problem List   Diagnosis Date Noted  . Aortic atherosclerosis ([place]) 03/08/2019  . Osteopenia determined by x-ray 08/13/2017  . Alopecia areata 07/23/2016  .  Heart palpitations 07/23/2016  . Xerosis cutis 04/11/2015  . Mixed hyperlipidemia 03/09/2015  . Tobacco use  disorder, moderate, in sustained remission 03/09/2015  . History of vertebral compression fracture 03/09/2015  . History of colon polyps 03/09/2015  . Benign hypertension 03/09/2015  . Microscopic hematuria 03/09/2015    Allergies  Allergen Reactions  . Bupropion Other (See Comments)    Hair loss    Past Surgical History:  Procedure Laterality Date  . AUGMENTATION MAMMAPLASTY Bilateral 1988  . BREAST ENHANCEMENT SURGERY    . BUNIONECTOMY Bilateral   . CERVICAL CONE BIOPSY  1971   LSIL  . EYE SURGERY  Lens replacement  . facet shots in back  05/2016   [facility]-   . TUBAL LIGATION      Social History   Tobacco Use  . Smoking status: Former Smoker    Packs/day: 0.75    Years: 60.00    Pack years: 45.00    Types: Cigarettes    Quit date: 12/20/2017    Years since quitting: 1.6  . Smokeless tobacco: Never Used  Substance Use Topics  . Alcohol use: Yes    Comment: 2 drinks or less/month  . Drug use: No     Medication list has been reviewed and updated.  Current Meds  Medication Sig  . atorvastatin (LIPITOR) 10 MG tablet TAKE 1 TABLET(10 MG) BY MOUTH DAILY AT 6 PM  . doxycycline (DORYX) 100 MG EC tablet Take 100 mg by mouth daily. For Eyes.  Marland Kitchen losartan (COZAAR) 50 MG tablet TAKE 1 TABLET BY MOUTH DAILY  . metoprolol succinate (TOPROL-XL) 25 MG 24 hr tablet Take 25 mg by mouth daily. Take 1/2 tablet daily  . triamcinolone cream (KENALOG) 0.1 % triamcinolone acetonide 0.1 % topical cream    PHQ 2/9 Scores 08/24/2019 08/10/2019 05/31/2019 03/08/2019  PHQ - 2 Score 0 0 0 0  PHQ- 9 Score 0 - 0 -    BP Readings from Last 3 Encounters:  08/24/19 128/60  05/31/19 138/62  05/30/19 (!) 169/67    Physical Exam Vitals and nursing note reviewed.  Constitutional:      General: She is not in acute distress.    Appearance: She is well-developed.  HENT:     Head: Normocephalic and atraumatic.     Right Ear: Tympanic membrane and ear canal normal.     Left Ear: Tympanic  membrane and ear canal normal.     Nose:     Right Sinus: No maxillary sinus tenderness.     Left Sinus: No maxillary sinus tenderness.  Eyes:     General: No scleral icterus.       Right eye: No discharge.        Left eye: No discharge.     Conjunctiva/sclera: Conjunctivae normal.  Neck:     Thyroid: No thyromegaly.     Vascular: No carotid bruit.  Cardiovascular:     Rate and Rhythm: Normal rate and regular rhythm.     Pulses: Normal pulses.     Heart sounds: Normal heart sounds.  Pulmonary:     Effort: Pulmonary effort is normal. No respiratory distress.     Breath sounds: No wheezing.  Chest:     Breasts:        Right: No mass, nipple discharge, skin change or tenderness.        Left: No mass, nipple discharge, skin change or tenderness.     Comments: Soft mobile non tender implants Abdominal:  General: Bowel sounds are normal.     Palpations: Abdomen is soft.     Tenderness: There is no abdominal tenderness.  Musculoskeletal:        General: Normal range of motion.     Cervical back: Normal range of motion. No erythema.  Lymphadenopathy:     Cervical: No cervical adenopathy.  Skin:    General: Skin is warm and dry.     Findings: No rash.  Neurological:     Mental Status: She is alert and oriented to person, place, and time.     Cranial Nerves: No cranial nerve deficit.     Sensory: No sensory deficit.     Deep Tendon Reflexes: Reflexes are normal and symmetric.  Psychiatric:        Speech: Speech normal.        Behavior: Behavior normal.        Thought Content: Thought content normal.     Wt Readings from Last 3 Encounters:  08/24/19 171 lb (77.6 kg)  05/31/19 179 lb (81.2 kg)  05/30/19 180 lb (81.6 kg)    BP 128/60   Pulse 61   Temp 97.7 F (36.5 C) (Oral)   Ht 5\' 6"  (1.676 m)   Wt 171 lb (77.6 kg)   SpO2 98%   BMI 27.60 kg/m   Assessment and Plan: 1. Annual physical exam Normal exam Continue healthy diet, exercise  2. Benign  hypertension Clinically stable exam with well controlled BP. Tolerating medications without side effects at this time. Pt to continue current regimen and low sodium diet; benefits of regular exercise as able discussed. - CBC with Differential/Platelet - TSH - POCT urinalysis dipstick  3. Aortic atherosclerosis ([place]) On statin therapy for primary prevention  4. Mixed hyperlipidemia Tolerating statin medication without side effects at this time Primary prevention  Continue same therapy without change at this time. - Comprehensive metabolic panel - Lipid panel  5. Tobacco use disorder, moderate, in sustained remission Remain tobacco free with no physical symptoms  Continue annual LDCT screening due this summer  6. Rosacea conjunctivitis of both eyes On Doxycycline 100 mg daily and eye drops   Partially dictated using Editor, commissioning. Any errors are unintentional.  Halina Maidens, MD [facility]  08/24/2019

## 2019-09-06 DIAGNOSIS — R002 Palpitations: Secondary | ICD-10-CM | POA: Diagnosis not present

## 2019-09-06 DIAGNOSIS — I493 Ventricular premature depolarization: Secondary | ICD-10-CM | POA: Diagnosis not present

## 2019-09-06 DIAGNOSIS — R001 Bradycardia, unspecified: Secondary | ICD-10-CM | POA: Diagnosis not present

## 2019-09-06 DIAGNOSIS — R0602 Shortness of breath: Secondary | ICD-10-CM | POA: Diagnosis not present

## 2019-09-19 ENCOUNTER — Ambulatory Visit
Admission: RE | Admit: 2019-09-19 | Discharge: 2019-09-19 | Disposition: A | Discharge status: Home / Self Care | Payer: Medicare Other | Source: Ambulatory Visit | Attending: Internal Medicine | Admitting: Internal Medicine

## 2019-09-19 ENCOUNTER — Other Ambulatory Visit: Payer: Self-pay

## 2019-09-19 DIAGNOSIS — Z1231 Encounter for screening mammogram for malignant neoplasm of breast: Secondary | ICD-10-CM | POA: Insufficient documentation

## 2019-09-19 DIAGNOSIS — M8589 Other specified disorders of bone density and structure, multiple sites: Secondary | ICD-10-CM | POA: Insufficient documentation

## 2019-09-19 DIAGNOSIS — Z780 Asymptomatic menopausal state: Secondary | ICD-10-CM | POA: Insufficient documentation

## 2019-10-14 ENCOUNTER — Telehealth: Payer: Self-pay

## 2019-10-14 DIAGNOSIS — Z122 Encounter for screening for malignant neoplasm of respiratory organs: Secondary | ICD-10-CM

## 2019-10-14 DIAGNOSIS — Z87891 Personal history of nicotine dependence: Secondary | ICD-10-CM

## 2019-10-14 NOTE — Telephone Encounter (Signed)
Contacted patient to schedule annual lung screening CT scan. Patient had her last scan in July of 2020.  Patient has been scheduled for Thursday, July 27 at 1:30.  She is aware of location of scan.  Patient has not had a COVID vaccine in the last 4 weeks (she had her vaccines in Jan/Feb).  Patient quit smoking 2 years ago and shares she is grateful for this lung screening program.  She has no conditions that would limit her getting treatment in the case of a cancer diagnosis.

## 2019-10-17 NOTE — Telephone Encounter (Signed)
Smoking history: former, quit 12/20/17, 45 pack year

## 2019-10-17 NOTE — Addendum Note (Signed)
Addended by: Lieutenant Diego on: 10/17/2019 01:46 PM   Modules accepted: Orders

## 2019-10-25 ENCOUNTER — Other Ambulatory Visit: Payer: Self-pay

## 2019-10-25 ENCOUNTER — Ambulatory Visit
Admission: RE | Admit: 2019-10-25 | Discharge: 2019-10-25 | Disposition: A | Discharge status: Home / Self Care | Payer: Medicare Other | Source: Ambulatory Visit | Attending: Oncology | Admitting: Oncology

## 2019-10-25 DIAGNOSIS — Z87891 Personal history of nicotine dependence: Secondary | ICD-10-CM | POA: Insufficient documentation

## 2019-10-25 DIAGNOSIS — Z122 Encounter for screening for malignant neoplasm of respiratory organs: Secondary | ICD-10-CM

## 2019-10-27 ENCOUNTER — Encounter: Payer: Self-pay | Admitting: Internal Medicine

## 2019-10-27 ENCOUNTER — Encounter: Payer: Self-pay | Admitting: *Deleted

## 2019-10-27 DIAGNOSIS — J432 Centrilobular emphysema: Secondary | ICD-10-CM

## 2019-10-27 HISTORY — DX: Centrilobular emphysema: J43.2

## 2019-11-20 ENCOUNTER — Other Ambulatory Visit: Payer: Self-pay | Admitting: Internal Medicine

## 2019-11-20 DIAGNOSIS — I10 Essential (primary) hypertension: Secondary | ICD-10-CM

## 2019-11-20 NOTE — Telephone Encounter (Signed)
Requested Prescriptions  Pending Prescriptions Disp Refills   losartan (COZAAR) 50 MG tablet [Pharmacy Med Name: LOSARTAN 50MG  TABLETS] 90 tablet 1    Sig: TAKE 1 TABLET BY MOUTH DAILY     Cardiovascular:  Angiotensin Receptor Blockers Passed - 11/20/2019 10:00 AM      Passed - Cr in normal range and within 180 days    Creatinine, Ser  Date Value Ref Range Status  08/24/2019 0.76 0.44 - 1.00 mg/dL Final         Passed - K in normal range and within 180 days    Potassium  Date Value Ref Range Status  08/24/2019 4.4 3.5 - 5.1 mmol/L Final         Passed - Patient is not pregnant      Passed - Last BP in normal range    BP Readings from Last 1 Encounters:  08/24/19 128/60         Passed - Valid encounter within last 6 months    Recent Outpatient Visits          2 months ago Benign hypertension   [facility], [facility]   5 months ago Benign hypertension   [facility], [facility]   8 months ago Benign hypertension   [facility], [facility]   1 year ago Benign hypertension   [facility], [facility]   2 years ago Benign hypertension   [facility], [facility]      Future Appointments            In 2 months Army Melia Jesse Sans, MD [facility], [place]   In 9 months Army Melia Jesse Sans, MD [facility], [facility]

## 2019-12-05 IMAGING — CT CT CHEST LUNG CANCER SCREENING LOW DOSE W/O CM
2 of 5 series · 15 of 40 positions shown, 18 images · non-contrast
Comparison: None.

CLINICAL DATA: 72-year-old asymptomatic female current smoker with
45 pack-year smoking history.

EXAM:
CT CHEST WITHOUT CONTRAST LOW-DOSE FOR LUNG CANCER SCREENING
TECHNIQUE: Multidetector CT imaging of the chest was performed following the
standard protocol without IV contrast.

[Series 3: lung · axial · 0.59mm/px · z∈[-1295,-1008]mm · 12 of 319 slices shown, 15 images (1 of 2)]
[im 16/319  mediastinal]
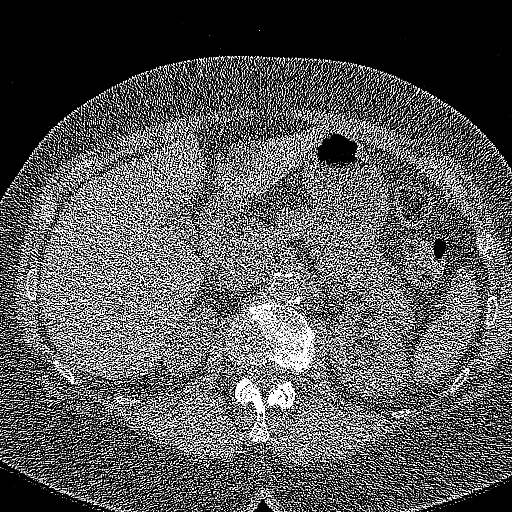
[im 16/319  lung]
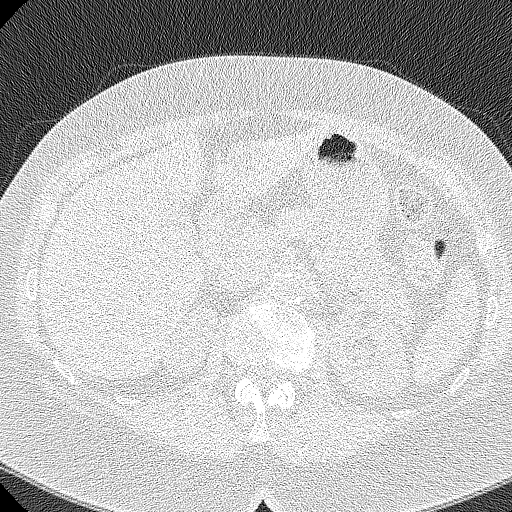
[im 48/319  lung]
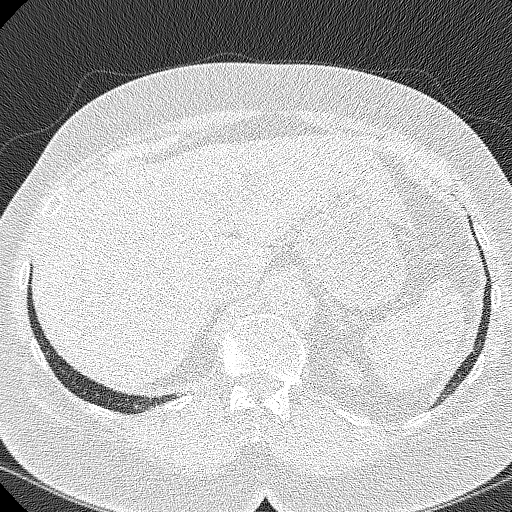
[im 64/319  lung]
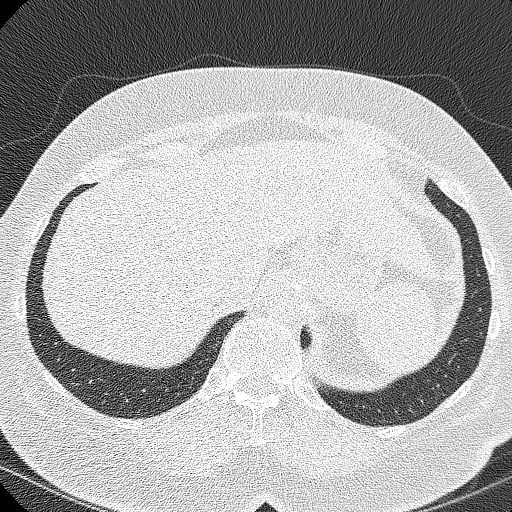
[im 96/319  lung]
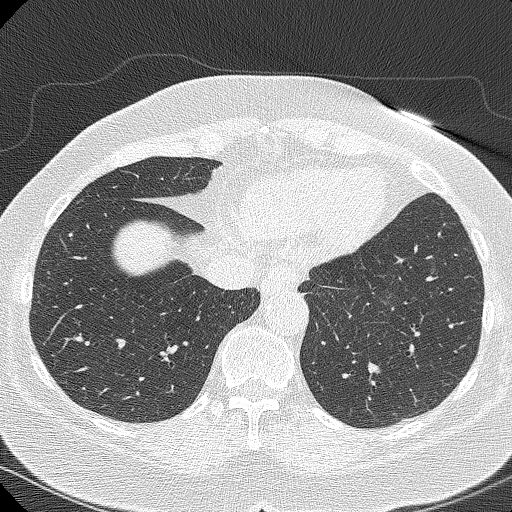
[im 128/319  mediastinal]
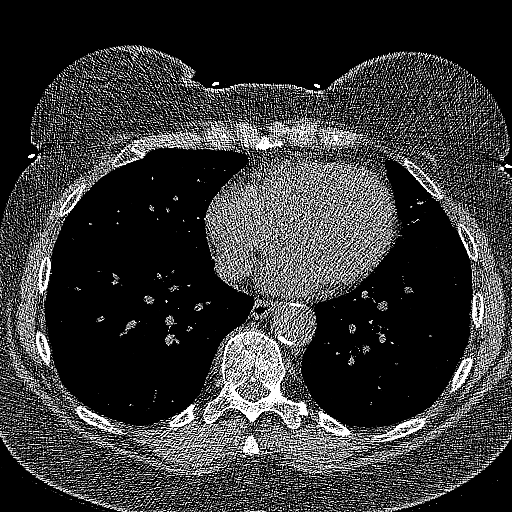
[im 128/319  lung]
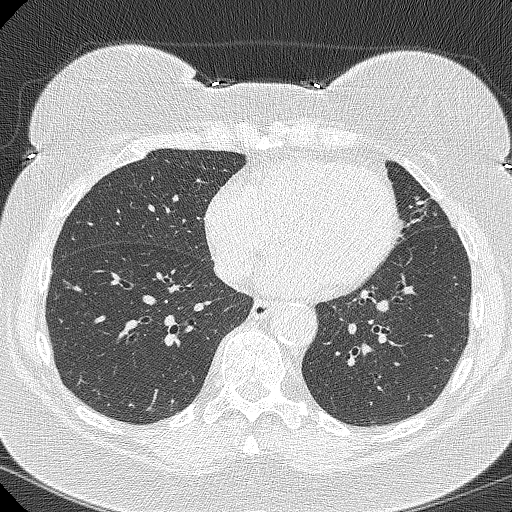
[im 144/319  lung]
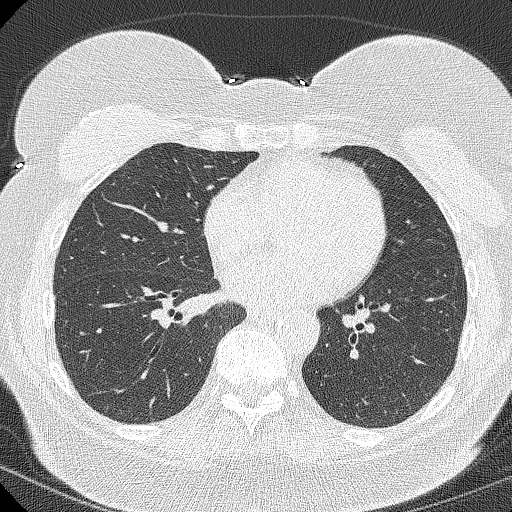
[im 175/319  lung]
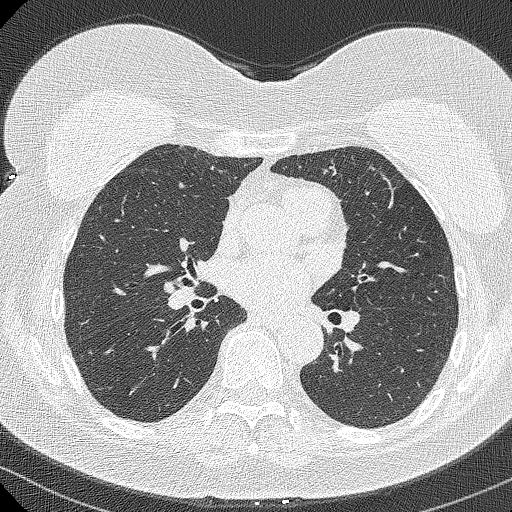
[im 191/319  lung]
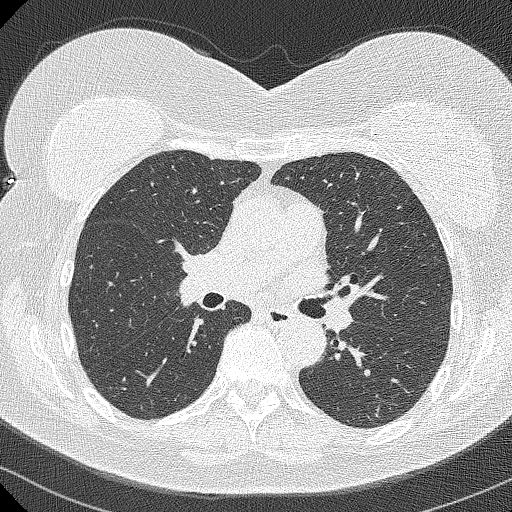
[im 223/319  mediastinal]
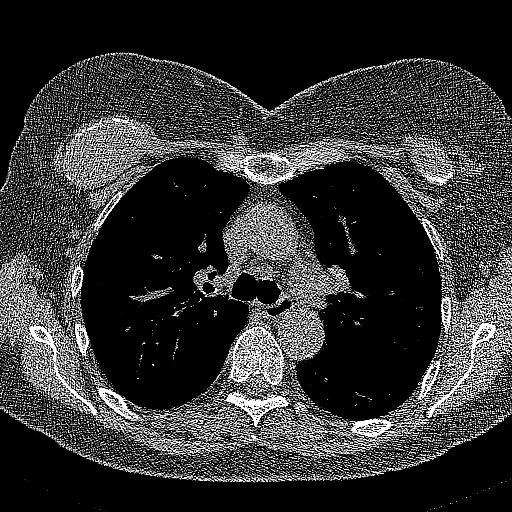
[im 223/319  lung]
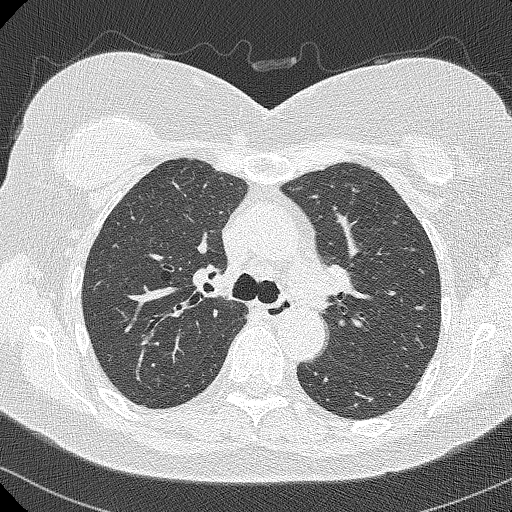
[im 255/319  lung]
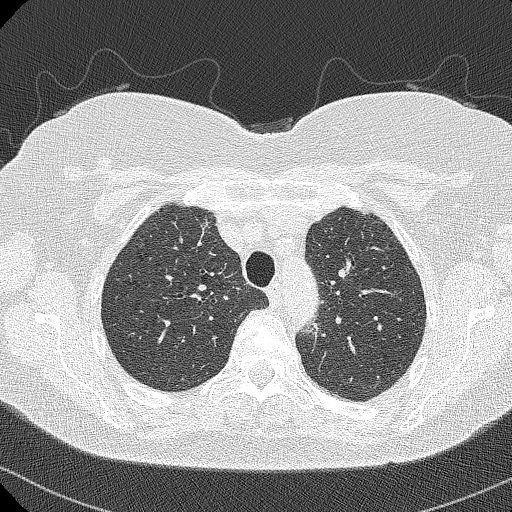
[im 271/319  lung]
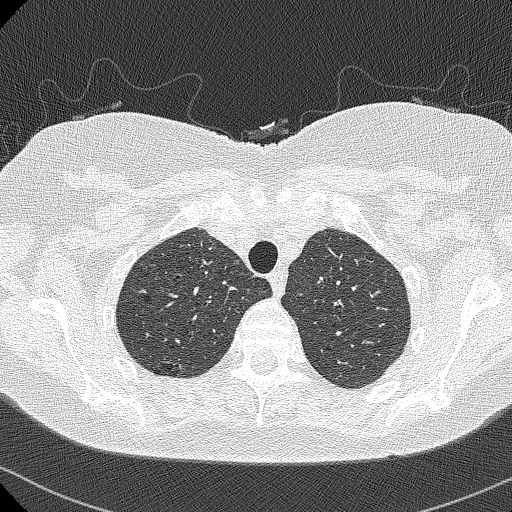
[im 303/319  lung]
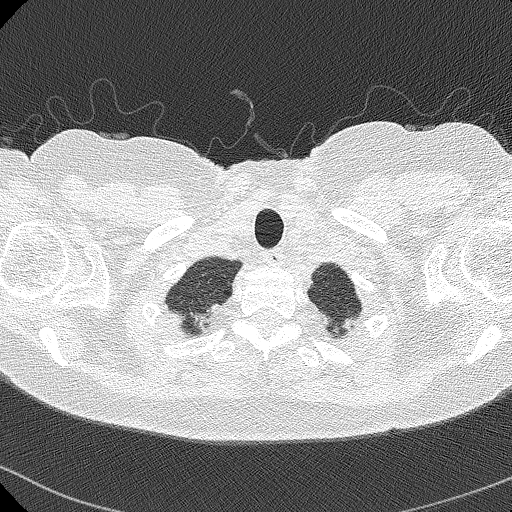

[Series 4: lung · coronal · 0.59mm/px · 3 of 301 slices shown (2 of 2)]
[im 61/301  lung]
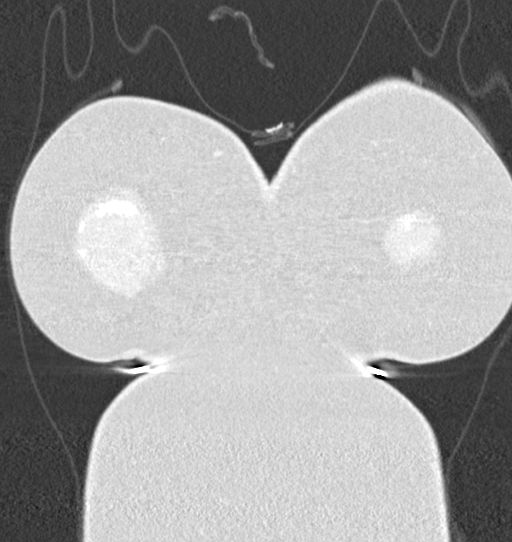
[im 121/301  lung]
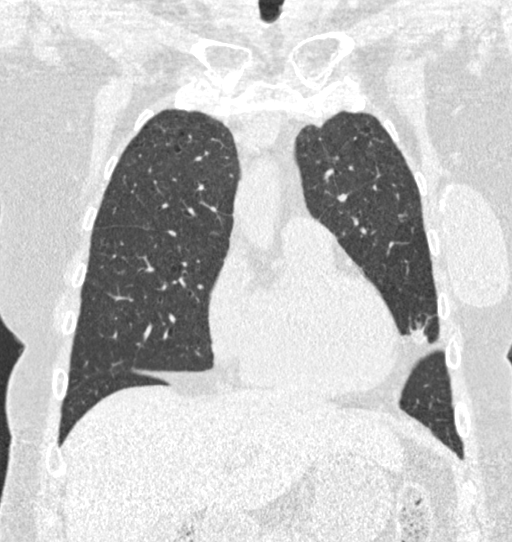
[im 181/301  lung]
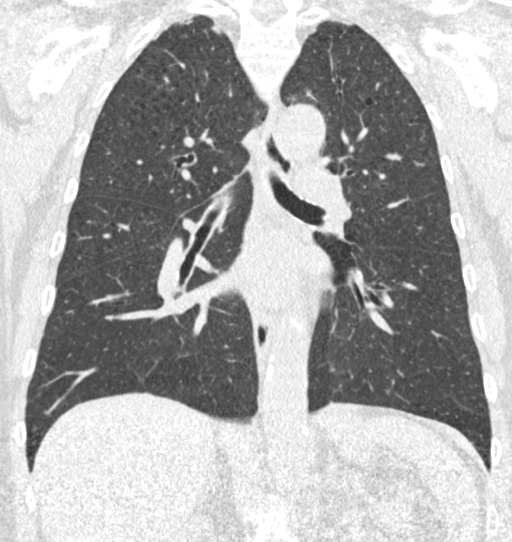

[15 of 40 positions shown; findings below may reference images not displayed]

FINDINGS: Cardiovascular: Normal heart size. No significant pericardial
effusion/thickening. Left anterior descending and right coronary
atherosclerosis. Atherosclerotic nonaneurysmal thoracic aorta.
Normal caliber pulmonary arteries.

Mediastinum/Nodes: No discrete thyroid nodules. Unremarkable
esophagus. No pathologically enlarged axillary, mediastinal or hilar
lymph nodes, noting limited sensitivity for the detection of hilar
adenopathy on this noncontrast study.

Lungs/Pleura: No pneumothorax. No pleural effusion. Mild
centrilobular and paraseptal emphysema with mild diffuse bronchial
wall thickening. No acute consolidative airspace disease or lung
masses. A few scattered tiny solid pulmonary nodules, largest 3.0 mm
in volume derived mean diameter in the lingula (series 3/image 158).

Upper abdomen: Tiny hiatal hernia. Simple left liver lobe cysts,
largest 2.5 cm.

Musculoskeletal: No aggressive appearing focal osseous lesions. Mild
thoracic spondylosis. Mild anterior T4 vertebral compression
fracture of indeterminate chronicity, chronic appearing.
IMPRESSION: 1. Lung-RADS 2, benign appearance or behavior. Continue annual
screening with low-dose chest CT without contrast in 12 months.
2. Two vessel coronary atherosclerosis.
3. Mild T4 vertebral compression fracture of indeterminate
chronicity, favor chronic.

Aortic Atherosclerosis (F1QKK-B17.7) and Emphysema (F1QKK-H0E.7).

## 2019-12-06 DIAGNOSIS — H40053 Ocular hypertension, bilateral: Secondary | ICD-10-CM | POA: Diagnosis not present

## 2019-12-06 DIAGNOSIS — H04123 Dry eye syndrome of bilateral lacrimal glands: Secondary | ICD-10-CM | POA: Diagnosis not present

## 2019-12-06 DIAGNOSIS — H0288B Meibomian gland dysfunction left eye, upper and lower eyelids: Secondary | ICD-10-CM | POA: Diagnosis not present

## 2019-12-06 DIAGNOSIS — H0288A Meibomian gland dysfunction right eye, upper and lower eyelids: Secondary | ICD-10-CM | POA: Diagnosis not present

## 2019-12-16 DIAGNOSIS — Z23 Encounter for immunization: Secondary | ICD-10-CM | POA: Diagnosis not present

## 2019-12-20 DIAGNOSIS — H04123 Dry eye syndrome of bilateral lacrimal glands: Secondary | ICD-10-CM | POA: Diagnosis not present

## 2019-12-20 DIAGNOSIS — H0288A Meibomian gland dysfunction right eye, upper and lower eyelids: Secondary | ICD-10-CM | POA: Diagnosis not present

## 2019-12-20 DIAGNOSIS — H40053 Ocular hypertension, bilateral: Secondary | ICD-10-CM | POA: Diagnosis not present

## 2019-12-20 DIAGNOSIS — H0288B Meibomian gland dysfunction left eye, upper and lower eyelids: Secondary | ICD-10-CM | POA: Diagnosis not present

## 2019-12-24 DIAGNOSIS — Z23 Encounter for immunization: Secondary | ICD-10-CM | POA: Diagnosis not present

## 2020-02-08 ENCOUNTER — Ambulatory Visit (INDEPENDENT_AMBULATORY_CARE_PROVIDER_SITE_OTHER): Payer: Medicare Other | Admitting: Internal Medicine

## 2020-02-08 ENCOUNTER — Encounter: Payer: Self-pay | Admitting: Internal Medicine

## 2020-02-08 ENCOUNTER — Other Ambulatory Visit: Payer: Self-pay

## 2020-02-08 VITALS — BP 124/68 | HR 76 | Ht 66.0 in | Wt 177.0 lb

## 2020-02-08 DIAGNOSIS — R739 Hyperglycemia, unspecified: Secondary | ICD-10-CM | POA: Diagnosis not present

## 2020-02-08 DIAGNOSIS — J432 Centrilobular emphysema: Secondary | ICD-10-CM

## 2020-02-08 DIAGNOSIS — I10 Essential (primary) hypertension: Secondary | ICD-10-CM | POA: Diagnosis not present

## 2020-02-08 DIAGNOSIS — R002 Palpitations: Secondary | ICD-10-CM | POA: Diagnosis not present

## 2020-02-08 DIAGNOSIS — E871 Hypo-osmolality and hyponatremia: Secondary | ICD-10-CM

## 2020-02-08 NOTE — Progress Notes (Signed)
Date:  02/08/2020   Name:  Melinda Perry [facility]   DOB:  1946-03-11   MRN:  [id number]   Chief Complaint: Hypertension (Also, wants sodium rechecked from last labs.)  Hypertension This is a chronic problem. The problem is controlled. Associated symptoms include palpitations. Pertinent negatives include no chest pain, headaches or shortness of breath. Past treatments include beta blockers and calcium channel blockers. The current treatment provides significant improvement. There are no compliance problems.   Elevated glucose - glucose was increased last visit. She admits that she was eating whatever she wanted and had gained some weight.  She is now back to a healthier diet and has lost a few pounds. Low Sodium - noted on last labs.  She does not restrict her sodium as much as she did previously.  She denies edema, confusion, headaches. COPD - noted to have mild COPD but has no symptoms of CP or SOB.  She does not see a pulmonologist or use any inhalers.  She is not limited in her activities.  Lab Results  Component Value Date   CREATININE 0.76 08/24/2019   BUN 17 08/24/2019   NA 128 (L) 08/24/2019   K 4.4 08/24/2019   CL 97 (L) 08/24/2019   CO2 21 (L) 08/24/2019   Lab Results  Component Value Date   CHOL 133 08/24/2019   HDL 59 08/24/2019   LDLCALC 65 08/24/2019   TRIG 46 08/24/2019   CHOLHDL 2.3 08/24/2019   Lab Results  Component Value Date   TSH 2.483 08/24/2019   No results found for: HGBA1C Lab Results  Component Value Date   WBC 6.0 08/24/2019   HGB 14.6 08/24/2019   HCT 43.4 08/24/2019   MCV 87.7 08/24/2019   PLT 203 08/24/2019   Lab Results  Component Value Date   ALT 22 08/24/2019   AST 27 08/24/2019   ALKPHOS 57 08/24/2019   BILITOT 1.2 08/24/2019     Review of Systems  Constitutional: Negative for appetite change, fatigue, fever and unexpected weight change.  HENT: Negative for tinnitus and trouble swallowing.   Eyes: Negative for visual  disturbance.  Respiratory: Negative for cough, chest tightness and shortness of breath.   Cardiovascular: Positive for palpitations. Negative for chest pain and leg swelling.  Gastrointestinal: Negative for abdominal pain.  Genitourinary: Negative for dysuria and hematuria.  Musculoskeletal: Negative for arthralgias.  Neurological: Negative for dizziness, tremors, numbness and headaches.  Psychiatric/Behavioral: Negative for dysphoric mood.    Patient Active Problem List   Diagnosis Date Noted  . Centrilobular emphysema ([place]) 10/27/2019  . Aortic atherosclerosis ([place]) 03/08/2019  . Osteopenia determined by x-ray 08/13/2017  . Alopecia areata 07/23/2016  . Heart palpitations 07/23/2016  . Xerosis cutis 04/11/2015  . Mixed hyperlipidemia 03/09/2015  . Tobacco use disorder, moderate, in sustained remission 03/09/2015  . History of vertebral compression fracture 03/09/2015  . History of colon polyps 03/09/2015  . Benign hypertension 03/09/2015  . Microscopic hematuria 03/09/2015    Allergies  Allergen Reactions  . Bupropion Other (See Comments)    Hair loss    Past Surgical History:  Procedure Laterality Date  . AUGMENTATION MAMMAPLASTY Bilateral 1988  . BREAST ENHANCEMENT SURGERY    . BUNIONECTOMY Bilateral   . CERVICAL CONE BIOPSY  1971   LSIL  . EYE SURGERY  Lens replacement  . facet shots in back  05/2016   [facility]-   . TUBAL LIGATION      Social History   Tobacco Use  .  Smoking status: Former Smoker    Packs/day: 0.75    Years: 60.00    Pack years: 45.00    Types: Cigarettes    Quit date: 12/20/2017    Years since quitting: 2.1  . Smokeless tobacco: Never Used  Vaping Use  . Vaping Use: Never used  Substance Use Topics  . Alcohol use: Yes    Comment: 2 drinks or less/month  . Drug use: No     Medication list has been reviewed and updated.  Current Meds  Medication Sig  . atorvastatin (LIPITOR) 10 MG tablet TAKE 1 TABLET(10 MG) BY MOUTH DAILY  AT 6 PM  . doxycycline (DORYX) 100 MG EC tablet Take 100 mg by mouth daily. For Eyes.  Marland Kitchen losartan (COZAAR) 50 MG tablet TAKE 1 TABLET BY MOUTH DAILY  . metoprolol succinate (TOPROL-XL) 25 MG 24 hr tablet Take 25 mg by mouth daily. Take 1/2 tablet daily  . triamcinolone cream (KENALOG) 0.1 % triamcinolone acetonide 0.1 % topical cream    PHQ 2/9 Scores 02/08/2020 08/24/2019 08/10/2019 05/31/2019  PHQ - 2 Score 0 0 0 0  PHQ- 9 Score 0 0 - 0    GAD 7 : Generalized Anxiety Score 02/08/2020 08/24/2019 05/31/2019  Nervous, Anxious, on Edge 0 0 0  Control/stop worrying 0 0 0  Worry too much - different things 0 0 0  Trouble relaxing 0 0 0  Restless 0 0 0  Easily annoyed or irritable 0 0 0  Afraid - awful might happen 0 0 0  Total GAD 7 Score 0 0 0  Anxiety Difficulty Not difficult at all Not difficult at all Not difficult at all    BP Readings from Last 3 Encounters:  02/08/20 124/68  08/24/19 128/60  05/31/19 138/62    Physical Exam Vitals and nursing note reviewed.  Constitutional:      General: She is not in acute distress.    Appearance: Normal appearance. She is well-developed.  HENT:     Head: Normocephalic and atraumatic.  Cardiovascular:     Rate and Rhythm: Normal rate and regular rhythm.  No extrasystoles are present.    Pulses: Normal pulses.     Heart sounds: No murmur heard.   Pulmonary:     Effort: Pulmonary effort is normal. No respiratory distress.     Breath sounds: No wheezing or rhonchi.  Musculoskeletal:     Cervical back: Normal range of motion.     Right lower leg: No edema.     Left lower leg: No edema.  Lymphadenopathy:     Cervical: No cervical adenopathy.  Skin:    General: Skin is warm and dry.     Capillary Refill: Capillary refill takes less than 2 seconds.     Findings: No rash.  Neurological:     General: No focal deficit present.     Mental Status: She is alert and oriented to person, place, and time.  Psychiatric:        Mood and Affect:  Mood normal.     Wt Readings from Last 3 Encounters:  02/08/20 177 lb (80.3 kg)  10/25/19 171 lb (77.6 kg)  08/24/19 171 lb (77.6 kg)    BP 124/68   Pulse 76   Ht 5\' 6"  (1.676 m)   Wt 177 lb (80.3 kg)   SpO2 98%   BMI 28.57 kg/m   Assessment and Plan: 1. Benign hypertension Clinically stable exam with well controlled BP. Tolerating medications without side effects at  this time. Pt to continue current regimen and low sodium diet; benefits of regular exercise as able discussed.  2. Heart palpitations Controlled fairly well on beta blockers  3. Hyperglycemia Check labs for early diabetes - Comprehensive metabolic panel - Hemoglobin A1c  4. Hyponatremia Continue to liberalize sodium - Comprehensive metabolic panel  5. Centrilobular emphysema ([place]) Not currently having any symptoms   Partially dictated using Editor, commissioning. Any errors are unintentional.  Halina Maidens, MD [facility]  02/08/2020

## 2020-02-09 ENCOUNTER — Encounter: Payer: Self-pay | Admitting: Internal Medicine

## 2020-02-09 DIAGNOSIS — R7303 Prediabetes: Secondary | ICD-10-CM | POA: Insufficient documentation

## 2020-02-09 LAB — COMPREHENSIVE METABOLIC PANEL
ALT: 18 IU/L (ref 0–32)
AST: 20 IU/L (ref 0–40)
Albumin/Globulin Ratio: 1.7 (ref 1.2–2.2)
Albumin: 4.6 g/dL (ref 3.7–4.7)
Alkaline Phosphatase: 78 IU/L (ref 44–121)
BUN/Creatinine Ratio: 29 — ABNORMAL HIGH (ref 12–28)
BUN: 25 mg/dL (ref 8–27)
Bilirubin Total: 0.7 mg/dL (ref 0.0–1.2)
CO2: 21 mmol/L (ref 20–29)
Calcium: 10.3 mg/dL (ref 8.7–10.3)
Chloride: 103 mmol/L (ref 96–106)
Creatinine, Ser: 0.87 mg/dL (ref 0.57–1.00)
GFR calc Af Amer: 75 mL/min/{1.73_m2} (ref >59)
GFR calc non Af Amer: 65 mL/min/{1.73_m2} (ref >59)
Globulin, Total: 2.7 g/dL (ref 1.5–4.5)
Glucose: 89 mg/dL (ref 65–99)
Potassium: 5 mmol/L (ref 3.5–5.2)
Sodium: 137 mmol/L (ref 134–144)
Total Protein: 7.3 g/dL (ref 6.0–8.5)

## 2020-02-09 LAB — HEMOGLOBIN A1C
Est. average glucose Bld gHb Est-mCnc: 117 mg/dL
Hgb A1c MFr Bld: 5.7 % — ABNORMAL HIGH (ref 4.8–5.6)

## 2020-02-13 ENCOUNTER — Telehealth: Payer: Self-pay

## 2020-02-13 NOTE — Telephone Encounter (Signed)
Pt. Given lab results, verbalizes understanding. 

## 2020-03-29 DIAGNOSIS — H04123 Dry eye syndrome of bilateral lacrimal glands: Secondary | ICD-10-CM | POA: Diagnosis not present

## 2020-03-29 DIAGNOSIS — H0288B Meibomian gland dysfunction left eye, upper and lower eyelids: Secondary | ICD-10-CM | POA: Diagnosis not present

## 2020-03-29 DIAGNOSIS — H40053 Ocular hypertension, bilateral: Secondary | ICD-10-CM | POA: Diagnosis not present

## 2020-03-29 DIAGNOSIS — H0288A Meibomian gland dysfunction right eye, upper and lower eyelids: Secondary | ICD-10-CM | POA: Diagnosis not present

## 2020-05-08 DIAGNOSIS — D225 Melanocytic nevi of trunk: Secondary | ICD-10-CM | POA: Diagnosis not present

## 2020-05-08 DIAGNOSIS — D2272 Melanocytic nevi of left lower limb, including hip: Secondary | ICD-10-CM | POA: Diagnosis not present

## 2020-05-08 DIAGNOSIS — L814 Other melanin hyperpigmentation: Secondary | ICD-10-CM | POA: Diagnosis not present

## 2020-05-08 DIAGNOSIS — D485 Neoplasm of uncertain behavior of skin: Secondary | ICD-10-CM | POA: Diagnosis not present

## 2020-05-17 ENCOUNTER — Other Ambulatory Visit: Payer: Self-pay | Admitting: Internal Medicine

## 2020-05-17 DIAGNOSIS — I10 Essential (primary) hypertension: Secondary | ICD-10-CM

## 2020-06-07 ENCOUNTER — Other Ambulatory Visit: Payer: Self-pay | Admitting: Internal Medicine

## 2020-06-07 DIAGNOSIS — E7849 Other hyperlipidemia: Secondary | ICD-10-CM

## 2020-06-07 DIAGNOSIS — I10 Essential (primary) hypertension: Secondary | ICD-10-CM

## 2020-06-07 NOTE — Telephone Encounter (Signed)
Requested Prescriptions  Pending Prescriptions Disp Refills  . atorvastatin (LIPITOR) 10 MG tablet [Pharmacy Med Name: ATORVASTATIN 10MG  TABLETS] 90 tablet 0    Sig: TAKE 1 TABLET(10 MG) BY MOUTH DAILY AT 6 PM     Cardiovascular:  Antilipid - Statins Passed - 06/07/2020  6:19 AM      Passed - Total Cholesterol in normal range and within 360 days    Cholesterol, Total  Date Value Ref Range Status  08/18/2018 175 100 - 199 mg/dL Final   Cholesterol  Date Value Ref Range Status  08/24/2019 133 0 - 200 mg/dL Final         Passed - LDL in normal range and within 360 days    LDL Calculated  Date Value Ref Range Status  08/18/2018 92 0 - 99 mg/dL Final   LDL Cholesterol  Date Value Ref Range Status  08/24/2019 65 0 - 99 mg/dL Final    Comment:           Total Cholesterol/HDL:CHD Risk Coronary Heart Disease Risk Table                     Men   Women  1/2 Average Risk   3.4   3.3  Average Risk       5.0   4.4  2 X Average Risk   9.6   7.1  3 X Average Risk  23.4   11.0        Use the calculated Patient Ratio above and the CHD Risk Table to determine the patient's CHD Risk.        ATP III CLASSIFICATION (LDL):  <100     mg/dL   Optimal  100-129  mg/dL   Near or Above                    Optimal  130-159  mg/dL   Borderline  160-189  mg/dL   High  >190     mg/dL   Very High Performed at [facility], [address] [place], [place] [address]          Passed - HDL in normal range and within 360 days    HDL  Date Value Ref Range Status  08/24/2019 59 >40 mg/dL Final  08/18/2018 66 >39 mg/dL Final         Passed - Triglycerides in normal range and within 360 days    Triglycerides  Date Value Ref Range Status  08/24/2019 46 <150 mg/dL Final         Passed - Patient is not pregnant      Passed - Valid encounter within last 12 months    Recent Outpatient Visits          4 months ago Benign hypertension   [place], [place]   9  months ago Benign hypertension   [facility], [facility]   1 year ago Benign hypertension   [facility], MD   1 year ago Benign hypertension   [facility], [facility]   1 year ago Benign hypertension   [facility], [facility]      Future Appointments            In 2 months Army Melia Jesse Sans, MD [facility], [facility]           . losartan (COZAAR) 25 MG  tablet [Pharmacy Med Name: LOSARTAN 25MG  TABLETS] 90 tablet 3    Sig: TAKE 1 TABLET(25 MG) BY MOUTH DAILY     Cardiovascular:  Angiotensin Receptor Blockers Passed - 06/07/2020  6:19 AM      Passed - Cr in normal range and within 180 days    Creatinine, Ser  Date Value Ref Range Status  02/08/2020 0.87 0.57 - 1.00 mg/dL Final         Passed - K in normal range and within 180 days    Potassium  Date Value Ref Range Status  02/08/2020 5.0 3.5 - 5.2 mmol/L Final         Passed - Patient is not pregnant      Passed - Last BP in normal range    BP Readings from Last 1 Encounters:  02/08/20 124/68         Passed - Valid encounter within last 6 months    Recent Outpatient Visits          4 months ago Benign hypertension   [place], MD   9 months ago Benign hypertension   [facility]an [facility], MD   1 year ago Benign hypertension   [facility], [facility]   1 year ago Benign hypertension   [facility]an [facility], MD   1 year ago Benign hypertension   [facility], [facility]      Future Appointments            In 2 months Army Melia Jesse Sans, MD [facility], [facility]

## 2020-06-18 DIAGNOSIS — H0288B Meibomian gland dysfunction left eye, upper and lower eyelids: Secondary | ICD-10-CM | POA: Diagnosis not present

## 2020-06-18 DIAGNOSIS — H04123 Dry eye syndrome of bilateral lacrimal glands: Secondary | ICD-10-CM | POA: Diagnosis not present

## 2020-06-18 DIAGNOSIS — H0288A Meibomian gland dysfunction right eye, upper and lower eyelids: Secondary | ICD-10-CM | POA: Diagnosis not present

## 2020-06-18 DIAGNOSIS — H40053 Ocular hypertension, bilateral: Secondary | ICD-10-CM | POA: Diagnosis not present

## 2020-06-19 DIAGNOSIS — R0602 Shortness of breath: Secondary | ICD-10-CM | POA: Diagnosis not present

## 2020-06-19 DIAGNOSIS — I493 Ventricular premature depolarization: Secondary | ICD-10-CM | POA: Diagnosis not present

## 2020-06-19 DIAGNOSIS — I10 Essential (primary) hypertension: Secondary | ICD-10-CM | POA: Diagnosis not present

## 2020-08-07 DIAGNOSIS — I83893 Varicose veins of bilateral lower extremities with other complications: Secondary | ICD-10-CM | POA: Diagnosis not present

## 2020-08-10 ENCOUNTER — Other Ambulatory Visit: Payer: Self-pay | Admitting: Internal Medicine

## 2020-08-10 DIAGNOSIS — I10 Essential (primary) hypertension: Secondary | ICD-10-CM

## 2020-08-10 NOTE — Telephone Encounter (Signed)
  Notes to clinic: Patient has  appointment  for 08/15/2020 Review for 90 day refill    Requested Prescriptions  Pending Prescriptions Disp Refills   losartan (COZAAR) 50 MG tablet [Pharmacy Med Name: LOSARTAN 50MG  TABLETS] 90 tablet 0    Sig: TAKE 1 TABLET BY MOUTH DAILY      Cardiovascular:  Angiotensin Receptor Blockers Failed - 08/10/2020 12:01 PM      Failed - Cr in normal range and within 180 days    Creatinine, Ser  Date Value Ref Range Status  02/08/2020 0.87 0.57 - 1.00 mg/dL Final          Failed - K in normal range and within 180 days    Potassium  Date Value Ref Range Status  02/08/2020 5.0 3.5 - 5.2 mmol/L Final          Failed - Valid encounter within last 6 months    Recent Outpatient Visits           6 months ago Benign hypertension   [place], [place]   11 months ago Benign hypertension   [facility] Glean Hess, MD   1 year ago Benign hypertension   [facility] Glean Hess, MD   1 year ago Benign hypertension   [facility] Glean Hess, MD   1 year ago Benign hypertension   [facility] Glean Hess, MD       Future Appointments             In 2 weeks Glean Hess, MD [facility], [place] - Patient is not pregnant      Passed - Last BP in normal range    BP Readings from Last 1 Encounters:  02/08/20 124/68

## 2020-08-13 ENCOUNTER — Ambulatory Visit: Payer: Medicare Other

## 2020-08-13 ENCOUNTER — Other Ambulatory Visit: Payer: Self-pay | Admitting: Internal Medicine

## 2020-08-13 DIAGNOSIS — Z1231 Encounter for screening mammogram for malignant neoplasm of breast: Secondary | ICD-10-CM

## 2020-08-15 ENCOUNTER — Ambulatory Visit (INDEPENDENT_AMBULATORY_CARE_PROVIDER_SITE_OTHER): Payer: Medicare Other

## 2020-08-15 DIAGNOSIS — Z0000 Encounter for general adult medical examination without abnormal findings: Secondary | ICD-10-CM | POA: Diagnosis not present

## 2020-08-15 NOTE — Progress Notes (Signed)
Subjective:   Melinda Perry is a 75 y.o. female who presents for Medicare Annual (Subsequent) preventive examination.  Virtual Visit via Telephone Note  I connected with  Shaunika Italiano Kosh on 08/15/20 at 10:00 AM EDT by telephone and verified that I am speaking with the correct person using two identifiers.  Location: Patient: home Provider: [facility] Persons participating in the virtual visit: [place]   I discussed the limitations, risks, security and privacy concerns of performing an evaluation and management service by telephone and the availability of in person appointments. The patient expressed understanding and agreed to proceed.  Interactive audio and video telecommunications were attempted between this nurse and patient, however failed, due to patient having technical difficulties OR patient did not have access to video capability.  We continued and completed visit with audio only.  Some vital signs may be absent or patient reported.   [name], LPN    Review of Systems     Cardiac Risk Factors include: advanced age (>59men, >34 women);dyslipidemia;hypertension     Objective:    There were no vitals filed for this visit. There is no height or weight on file to calculate BMI.  Advanced Directives 08/15/2020 05/30/2019 08/09/2018 08/05/2017 01/26/2017 07/23/2016 05/10/2015  Does Patient Have a Medical Advance Directive? Yes No Yes Yes No Yes Yes  Type of Paramedic of [place];Living will - Living will;Healthcare Power of [place];Living will - Healthcare Power of [place];Living will  Copy of [place] in Chart? Yes - validated most recent copy scanned in chart (See row information) - Yes - validated most recent copy scanned in chart (See row information) No - copy requested - No - copy requested -    Current Medications (verified) Outpatient  Encounter Medications as of 08/15/2020  Medication Sig  . atorvastatin (LIPITOR) 10 MG tablet TAKE 1 TABLET(10 MG) BY MOUTH DAILY AT 6 PM  . Calcium Carbonate+Vitamin D (CALCIUM 600 + D) 600-200 MG-UNIT TABS Take 1 tablet by mouth daily.  Marland Kitchen losartan (COZAAR) 50 MG tablet TAKE 1 TABLET BY MOUTH DAILY  . metoprolol succinate (TOPROL-XL) 25 MG 24 hr tablet Take 25 mg by mouth daily. Take 1/2 tablet daily  . Multiple Vitamins-Minerals (MULTIVITAMIN WOMENS 50+ ADV PO) Take by mouth.  . triamcinolone cream (KENALOG) 0.1 % triamcinolone acetonide 0.1 % topical cream  . [DISCONTINUED] doxycycline (DORYX) 100 MG EC tablet Take 100 mg by mouth daily. For Eyes.  . [DISCONTINUED] omeprazole (PRILOSEC) 10 MG capsule Take 10 mg by mouth daily. TEMPERARY- OTC for Keto Diet   No facility-administered encounter medications on file as of 08/15/2020.    Allergies (verified) Bupropion   History: Past Medical History:  Diagnosis Date  . Ankle pain 04/11/2015  . Cancer ([place])    skin ca -squamous cell on face  . Centrilobular emphysema ([place]) 10/27/2019  . High cholesterol   . Hypertension   . PVC (premature ventricular contraction)    Past Surgical History:  Procedure Laterality Date  . AUGMENTATION MAMMAPLASTY Bilateral 1988  . BREAST ENHANCEMENT SURGERY    . BUNIONECTOMY Bilateral   . CERVICAL CONE BIOPSY  1971   LSIL  . EYE SURGERY  Lens replacement  . facet shots in back  05/2016   [facility]-   . TUBAL LIGATION     Family History  Problem Relation Age of Onset  . Lung cancer Mother   . Alcohol abuse Mother   .  Cancer Mother   . Pulmonary fibrosis Father   . Alcohol abuse Sister   . Breast cancer Neg Hx    Social History   Socioeconomic History  . Marital status: Divorced    Spouse name: Not on file  . Number of children: 2  . Years of education: some college  . Highest education level: 12th grade  Occupational History  . Occupation: Retired  Tobacco Use  . Smoking status:  Former Smoker    Packs/day: 0.75    Years: 60.00    Pack years: 45.00    Types: Cigarettes    Quit date: 12/20/2017    Years since quitting: 2.6  . Smokeless tobacco: Never Used  Vaping Use  . Vaping Use: Never used  Substance and Sexual Activity  . Alcohol use: Yes    Alcohol/week: 0.0 standard drinks    Comment: 2 drinks or less/month  . Drug use: No  . Sexual activity: Not Currently    Birth control/protection: None  Other Topics Concern  . Not on file  Social History Narrative   Pt lives alone   Social Determinants of Health   Financial Resource Strain: Low Risk   . Difficulty of Paying Living Expenses: Not hard at all  Food Insecurity: No Food Insecurity  . Worried About Charity fundraiser in the Last Year: Never true  . Ran Out of Food in the Last Year: Never true  Transportation Needs: No Transportation Needs  . Lack of Transportation (Medical): No  . Lack of Transportation (Non-Medical): No  Physical Activity: Sufficiently Active  . Days of Exercise per Week: 7 days  . Minutes of Exercise per Session: 40 min  Stress: No Stress Concern Present  . Feeling of Stress : Only a little  Social Connections: Socially Isolated  . Frequency of Communication with Friends and Family: More than three times a week  . Frequency of Social Gatherings with Friends and Family: More than three times a week  . Attends Religious Services: Never  . Active Member of Clubs or Organizations: No  . Attends Archivist Meetings: Never  . Marital Status: Divorced    Tobacco Counseling Counseling given: Not Answered   Clinical Intake:  Pre-visit preparation completed: Yes  Pain : No/denies pain     Nutritional Risks: None Diabetes: No  How often do you need to have someone help you when you read instructions, pamphlets, or other written materials from your doctor or pharmacy?: 1 - Never    Interpreter Needed?: No  Information entered by :: [name]  LPN   Activities of Daily Living In your present state of health, do you have any difficulty performing the following activities: 08/15/2020  Hearing? N  Comment declines hearing aids  Vision? N  Difficulty concentrating or making decisions? N  Walking or climbing stairs? N  Dressing or bathing? N  Doing errands, shopping? N  Preparing Food and eating ? N  Using the Toilet? N  In the past six months, have you accidently leaked urine? N  Do you have problems with loss of bowel control? N  Managing your Medications? N  Managing your Finances? N  Housekeeping or managing your Housekeeping? N  Some recent data might be hidden    Patient Care Team: Glean Hess, MD as PCP - General (Internal Medicine) Isaias Cowman, MD as Consulting Physician (Cardiology) Dasher, Rayvon Char, MD as Consulting Physician (Dermatology) [facility] (Pain Medicine)  Indicate any recent Medical Services you  may have received from other than [facility] providers in the past year (date may be approximate).     Assessment:   This is a routine wellness examination for [place].  Hearing/Vision screen  Hearing Screening   125Hz  250Hz  500Hz  1000Hz  2000Hz  3000Hz  4000Hz  6000Hz  8000Hz   Right ear:           Left ear:           Comments: Pt denies hearing difficulty  Vision Screening Comments: Annual vision screenings at [facility]  Dietary issues and exercise activities discussed: Current Exercise Habits: Home exercise routine, Type of exercise: walking, Time (Minutes): 40, Frequency (Times/Week): 7, Weekly Exercise (Minutes/Week): 280, Intensity: Moderate, Exercise limited by: None identified  Goals Addressed   None    Depression Screen PHQ 2/9 Scores 08/15/2020 02/08/2020 08/24/2019 08/10/2019 05/31/2019 03/08/2019 08/18/2018  PHQ - 2 Score 0 0 0 0 0 0 0  PHQ- 9 Score - 0 0 - 0 - -    Fall Risk Fall Risk  08/15/2020 02/08/2020 08/24/2019 08/10/2019 08/18/2018  Falls in the past  year? 1 0 0 0 0  Number falls in past yr: 0 0 0 0 0  Injury with Fall? 0 0 0 0 0  Risk for fall due to : No Fall Risks No Fall Risks No Fall Risks No Fall Risks -  Risk for fall due to: Comment - - - - -  Follow up Falls prevention discussed Falls evaluation completed Falls evaluation completed Falls prevention discussed Falls evaluation completed    FALL RISK PREVENTION PERTAINING TO THE HOME:  Any stairs in or around the home? Yes  If so, are there any without handrails? No  Home free of loose throw rugs in walkways, pet beds, electrical cords, etc? Yes  Adequate lighting in your home to reduce risk of falls? Yes   ASSISTIVE DEVICES UTILIZED TO PREVENT FALLS:  Life alert? No  Use of a cane, walker or w/c? No  Grab bars in the bathroom? Yes  Shower chair or bench in shower? Yes  Elevated toilet seat or a handicapped toilet? No   TIMED UP AND GO:  Was the test performed? No . telephonic visit  Cognitive Function: Normal cognitive status assessed by direct observation by this Nurse Health Advisor. No abnormalities found.       6CIT Screen 08/09/2018 08/05/2017 07/23/2016  What Year? 0 points 0 points 0 points  What month? 0 points 0 points 0 points  What time? 0 points 0 points 0 points  Count back from 20 0 points 0 points 0 points  Months in reverse 0 points 0 points 0 points  Repeat phrase 0 points 0 points 0 points  Total Score 0 0 0    Immunizations Immunization History  Administered Date(s) Administered  . Fluad Quad(high Dose 65+) 12/16/2018  . Influenza Split 01/29/2009, 03/31/2009, 01/07/2011  . Influenza, High Dose Seasonal PF 01/06/2017  . Influenza-Unspecified 01/13/2016, 12/20/2017, 12/16/2019  . PFIZER(Purple Top)SARS-COV-2 Vaccination 04/20/2019, 05/14/2019, 12/24/2019, 07/09/2020  . Pneumococcal Conjugate-13 11/16/2013  . Pneumococcal Polysaccharide-23 07/29/2009, 04/01/2010  . Tdap 04/01/2010, 01/07/2011  . Zoster 04/01/2010  . Zoster Recombinat  (Shingrix) 07/24/2016, 11/24/2016    TDAP status: Up to date  Flu Vaccine status: Up to date  Pneumococcal vaccine status: Up to date  Covid-19 vaccine status: Completed vaccines  Qualifies for Shingles Vaccine? Yes   Zostavax completed Yes   Shingrix Completed?: Yes  Screening Tests Health Maintenance  Topic Date Due  .  MAMMOGRAM  09/18/2020  . INFLUENZA VACCINE  10/29/2020  . TETANUS/TDAP  01/06/2021  . DEXA SCAN  Completed  . COVID-19 Vaccine  Completed  . PNA vac Low Risk Adult  Completed  . Hepatitis C Screening  Addressed  . HPV VACCINES  Aged Out    Health Maintenance  There are no preventive care reminders to display for this patient.  Colorectal cancer screening: No longer required.   Mammogram status: Completed 09/19/19. Repeat every year  Bone Density status: Completed 6/21/6/21/21. Results reflect: Bone density results: OSTEOPENIA. Repeat every 2 years.  Lung Cancer Screening: (Low Dose CT Chest recommended if Age 11-80 years, 30 pack-year currently smoking OR have quit w/in 15years.) does qualify. Completed 10/25/19.  Additional Screening:  Hepatitis C Screening: does qualify; Completed 04/01/13.  Vision Screening: Recommended annual ophthalmology exams for early detection of glaucoma and other disorders of the eye. Is the patient up to date with their annual eye exam?  Yes  Who is the provider or what is the name of the office in which the patient attends annual eye exams? Dr. Clydene Laming.   Dental Screening: Recommended annual dental exams for proper oral hygiene  Community Resource Referral / Chronic Care Management: CRR required this visit?  No   CCM required this visit?  No      Plan:     I have personally reviewed and noted the following in the patient's chart:   . Medical and social history . Use of alcohol, tobacco or illicit drugs  . Current medications and supplements including opioid prescriptions.  . Functional ability and  status . Nutritional status . Physical activity . Advanced directives . List of other physicians . Hospitalizations, surgeries, and ER visits in previous 12 months . Vitals . Screenings to include cognitive, depression, and falls . Referrals and appointments  In addition, I have reviewed and discussed with patient certain preventive protocols, quality metrics, and best practice recommendations. A written personalized care plan for preventive services as well as general preventive health recommendations were provided to patient.     [name], LPN   2/99/2426   Nurse Notes: none

## 2020-08-15 NOTE — Patient Instructions (Addendum)
Melinda Perry , Thank you for taking time to come for your Medicare Wellness Visit. I appreciate your ongoing commitment to your health goals. Please review the following plan we discussed and let me know if I can assist you in the future.   Screening recommendations/referrals: Colonoscopy: no longer required Mammogram: done 09/19/19. scheduled for 09/20/20 Bone Density: done 09/18/20 Recommended yearly ophthalmology/optometry visit for glaucoma screening and checkup Recommended yearly dental visit for hygiene and checkup  Vaccinations: Influenza vaccine: done 12/16/19 Pneumococcal vaccine: done 11/16/13 Tdap vaccine: done 01/07/11 Shingles vaccine: done 07/24/16 & 11/24/16   Covid-19:done 04/20/19, 05/14/19, 12/24/19 & 07/09/20  Conditions/risks identified: Keep up the great work!  Next appointment: Follow up in one year for your annual wellness visit    Preventive Care 65 Years and Older, Female Preventive care refers to lifestyle choices and visits with your health care provider that can promote health and wellness. What does preventive care include?  A yearly physical exam. This is also called an annual well check.  Dental exams once or twice a year.  Routine eye exams. Ask your health care provider how often you should have your eyes checked.  Personal lifestyle choices, including:  Daily care of your teeth and gums.  Regular physical activity.  Eating a healthy diet.  Avoiding tobacco and drug use.  Limiting alcohol use.  Practicing safe sex.  Taking low-dose aspirin every day.  Taking vitamin and mineral supplements as recommended by your health care provider. What happens during an annual well check? The services and screenings done by your health care provider during your annual well check will depend on your age, overall health, lifestyle risk factors, and family history of disease. Counseling  Your health care provider may ask you questions about your:  Alcohol  use.  Tobacco use.  Drug use.  Emotional well-being.  Home and relationship well-being.  Sexual activity.  Eating habits.  History of falls.  Memory and ability to understand (cognition).  Work and work Statistician.  Reproductive health. Screening  You may have the following tests or measurements:  Height, weight, and BMI.  Blood pressure.  Lipid and cholesterol levels. These may be checked every 5 years, or more frequently if you are over 59 years old.  Skin check.  Lung cancer screening. You may have this screening every year starting at age 4 if you have a 30-pack-year history of smoking and currently smoke or have quit within the past 15 years.  Fecal occult blood test (FOBT) of the stool. You may have this test every year starting at age 87.  Flexible sigmoidoscopy or colonoscopy. You may have a sigmoidoscopy every 5 years or a colonoscopy every 10 years starting at age 48.  Hepatitis C blood test.  Hepatitis B blood test.  Sexually transmitted disease (STD) testing.  Diabetes screening. This is done by checking your blood sugar (glucose) after you have not eaten for a while (fasting). You may have this done every 1-3 years.  Bone density scan. This is done to screen for osteoporosis. You may have this done starting at age 8.  Mammogram. This may be done every 1-2 years. Talk to your health care provider about how often you should have regular mammograms. Talk with your health care provider about your test results, treatment options, and if necessary, the need for more tests. Vaccines  Your health care provider may recommend certain vaccines, such as:  Influenza vaccine. This is recommended every year.  Tetanus, diphtheria, and acellular pertussis (  Tdap, Td) vaccine. You may need a Td booster every 10 years.  Zoster vaccine. You may need this after age 42.  Pneumococcal 13-valent conjugate (PCV13) vaccine. One dose is recommended after age  57.  Pneumococcal polysaccharide (PPSV23) vaccine. One dose is recommended after age 2. Talk to your health care provider about which screenings and vaccines you need and how often you need them. This information is not intended to replace advice given to you by your health care provider. Make sure you discuss any questions you have with your health care provider. Document Released: 04/13/2015 Document Revised: 12/05/2015 Document Reviewed: 01/16/2015 Elsevier Interactive Patient Education  2017 [place] Prevention in the Home Falls can cause injuries. They can happen to people of all ages. There are many things you can do to make your home safe and to help prevent falls. What can I do on the outside of my home?  Regularly fix the edges of walkways and driveways and fix any cracks.  Remove anything that might make you trip as you walk through a door, such as a raised step or threshold.  Trim any bushes or trees on the path to your home.  Use bright outdoor lighting.  Clear any walking paths of anything that might make someone trip, such as rocks or tools.  Regularly check to see if handrails are loose or broken. Make sure that both sides of any steps have handrails.  Any raised decks and porches should have guardrails on the edges.  Have any leaves, snow, or ice cleared regularly.  Use sand or salt on walking paths during winter.  Clean up any spills in your garage right away. This includes oil or grease spills. What can I do in the bathroom?  Use night lights.  Install grab bars by the toilet and in the tub and shower. Do not use towel bars as grab bars.  Use non-skid mats or decals in the tub or shower.  If you need to sit down in the shower, use a plastic, non-slip stool.  Keep the floor dry. Clean up any water that spills on the floor as soon as it happens.  Remove soap buildup in the tub or shower regularly.  Attach bath mats securely with double-sided  non-slip rug tape.  Do not have throw rugs and other things on the floor that can make you trip. What can I do in the bedroom?  Use night lights.  Make sure that you have a light by your bed that is easy to reach.  Do not use any sheets or blankets that are too big for your bed. They should not hang down onto the floor.  Have a firm chair that has side arms. You can use this for support while you get dressed.  Do not have throw rugs and other things on the floor that can make you trip. What can I do in the kitchen?  Clean up any spills right away.  Avoid walking on wet floors.  Keep items that you use a lot in easy-to-reach places.  If you need to reach something above you, use a strong step stool that has a grab bar.  Keep electrical cords out of the way.  Do not use floor polish or wax that makes floors slippery. If you must use wax, use non-skid floor wax.  Do not have throw rugs and other things on the floor that can make you trip. What can I do with my stairs?  Do  not leave any items on the stairs.  Make sure that there are handrails on both sides of the stairs and use them. Fix handrails that are broken or loose. Make sure that handrails are as long as the stairways.  Check any carpeting to make sure that it is firmly attached to the stairs. Fix any carpet that is loose or worn.  Avoid having throw rugs at the top or bottom of the stairs. If you do have throw rugs, attach them to the floor with carpet tape.  Make sure that you have a light switch at the top of the stairs and the bottom of the stairs. If you do not have them, ask someone to add them for you. What else can I do to help prevent falls?  Wear shoes that:  Do not have high heels.  Have rubber bottoms.  Are comfortable and fit you well.  Are closed at the toe. Do not wear sandals.  If you use a stepladder:  Make sure that it is fully opened. Do not climb a closed stepladder.  Make sure that both  sides of the stepladder are locked into place.  Ask someone to hold it for you, if possible.  Clearly mark and make sure that you can see:  Any grab bars or handrails.  First and last steps.  Where the edge of each step is.  Use tools that help you move around (mobility aids) if they are needed. These include:  Canes.  Walkers.  Scooters.  Crutches.  Turn on the lights when you go into a dark area. Replace any light bulbs as soon as they burn out.  Set up your furniture so you have a clear path. Avoid moving your furniture around.  If any of your floors are uneven, fix them.  If there are any pets around you, be aware of where they are.  Review your medicines with your doctor. Some medicines can make you feel dizzy. This can increase your chance of falling. Ask your doctor what other things that you can do to help prevent falls. This information is not intended to replace advice given to you by your health care provider. Make sure you discuss any questions you have with your health care provider. Document Released: 01/11/2009 Document Revised: 08/23/2015 Document Reviewed: 04/21/2014 Elsevier Interactive Patient Education  2017 [employer]d

## 2020-08-28 ENCOUNTER — Ambulatory Visit (INDEPENDENT_AMBULATORY_CARE_PROVIDER_SITE_OTHER): Payer: Medicare Other | Admitting: Internal Medicine

## 2020-08-28 ENCOUNTER — Other Ambulatory Visit: Payer: Self-pay

## 2020-08-28 ENCOUNTER — Encounter: Payer: Self-pay | Admitting: Internal Medicine

## 2020-08-28 VITALS — BP 116/74 | HR 60 | Temp 97.6°F | Ht 66.0 in | Wt 177.0 lb

## 2020-08-28 DIAGNOSIS — I10 Essential (primary) hypertension: Secondary | ICD-10-CM | POA: Diagnosis not present

## 2020-08-28 DIAGNOSIS — Z0000 Encounter for general adult medical examination without abnormal findings: Secondary | ICD-10-CM | POA: Diagnosis not present

## 2020-08-28 DIAGNOSIS — J432 Centrilobular emphysema: Secondary | ICD-10-CM

## 2020-08-28 DIAGNOSIS — R7303 Prediabetes: Secondary | ICD-10-CM

## 2020-08-28 DIAGNOSIS — I700 Atherosclerosis of aorta: Secondary | ICD-10-CM | POA: Diagnosis not present

## 2020-08-28 DIAGNOSIS — S61219A Laceration without foreign body of unspecified finger without damage to nail, initial encounter: Secondary | ICD-10-CM | POA: Diagnosis not present

## 2020-08-28 DIAGNOSIS — Z23 Encounter for immunization: Secondary | ICD-10-CM | POA: Diagnosis not present

## 2020-08-28 DIAGNOSIS — E782 Mixed hyperlipidemia: Secondary | ICD-10-CM | POA: Diagnosis not present

## 2020-08-28 DIAGNOSIS — M8580 Other specified disorders of bone density and structure, unspecified site: Secondary | ICD-10-CM

## 2020-08-28 LAB — POCT URINALYSIS DIPSTICK
Bilirubin, UA: NEGATIVE
Glucose, UA: NEGATIVE
Ketones, UA: NEGATIVE
Nitrite, UA: NEGATIVE
Protein, UA: NEGATIVE
Spec Grav, UA: <=1.005 — AB (ref 1.010–1.025)
Urobilinogen, UA: 0.2 E.U./dL
pH, UA: 8 (ref 5.0–8.0)

## 2020-08-28 MED ORDER — ATORVASTATIN CALCIUM 10 MG PO TABS: ORAL_TABLET | ORAL | 3 refills | Status: DC

## 2020-08-28 NOTE — Progress Notes (Signed)
Date:  08/28/2020   Name:  Ewelina Naves [facility]   DOB:  Nov 22, 1945   MRN:  [id number]   Chief Complaint: Annual Exam (Breast exam no pap)  Melinda Perry is a 75 y.o. female who presents today for her Complete Annual Exam. She feels well. She reports exercising walking x5 days a week. She reports she is sleeping well. Breast complaints none.  Mammogram: 08/2019 scheduled DEXA: 08/2019 Colonoscopy:   Last 2014  Immunization History  Administered Date(s) Administered  . Fluad Quad(high Dose 65+) 12/16/2018  . Influenza Split 01/29/2009, 03/31/2009, 01/07/2011  . Influenza, High Dose Seasonal PF 01/06/2017  . Influenza-Unspecified 01/13/2016, 12/20/2017, 12/16/2019  . PFIZER(Purple Top)SARS-COV-2 Vaccination 04/20/2019, 05/14/2019, 12/24/2019, 07/09/2020  . Pneumococcal Conjugate-13 11/16/2013  . Pneumococcal Polysaccharide-23 07/29/2009, 04/01/2010  . Tdap 04/01/2010, 01/07/2011  . Zoster Recombinat (Shingrix) 07/24/2016, 11/24/2016  . Zoster, Live 04/01/2010   Finger laceration - right thumb cut at home on 08/19/20.  It is healing but her Tdap is due.  Hypertension This is a chronic problem. The problem is unchanged. The problem is controlled. Pertinent negatives include no chest pain, headaches, palpitations or shortness of breath. Past treatments include beta blockers and angiotensin blockers. The current treatment provides significant improvement. There are no compliance problems.   Hyperlipidemia This is a chronic problem. The problem is controlled. Pertinent negatives include no chest pain or shortness of breath. Current antihyperlipidemic treatment includes statins. The current treatment provides significant improvement of lipids.  Diabetes She presents for her follow-up diabetic visit. Diabetes type: prediabetes. Her disease course has been stable. Pertinent negatives for hypoglycemia include no dizziness, headaches, nervousness/anxiousness or tremors. Pertinent  negatives for diabetes include no chest pain, no fatigue, no polydipsia, no polyuria and no weakness.    Lab Results  Component Value Date   CREATININE 0.87 02/08/2020   BUN 25 02/08/2020   NA 137 02/08/2020   K 5.0 02/08/2020   CL 103 02/08/2020   CO2 21 02/08/2020   Lab Results  Component Value Date   CHOL 133 08/24/2019   HDL 59 08/24/2019   LDLCALC 65 08/24/2019   TRIG 46 08/24/2019   CHOLHDL 2.3 08/24/2019   Lab Results  Component Value Date   TSH 2.483 08/24/2019   Lab Results  Component Value Date   HGBA1C 5.7 (H) 02/08/2020   Lab Results  Component Value Date   WBC 6.0 08/24/2019   HGB 14.6 08/24/2019   HCT 43.4 08/24/2019   MCV 87.7 08/24/2019   PLT 203 08/24/2019   Lab Results  Component Value Date   ALT 18 02/08/2020   AST 20 02/08/2020   ALKPHOS 78 02/08/2020   BILITOT 0.7 02/08/2020  Last vitamin D No results found for: 25OHVITD2, 25OHVITD3, VD25OH    Review of Systems  Constitutional: Negative for chills, fatigue, fever and unexpected weight change.  HENT: Negative for congestion, hearing loss, nosebleeds, tinnitus, trouble swallowing and voice change.   Eyes: Positive for redness. Negative for visual disturbance.  Respiratory: Negative for cough, chest tightness, shortness of breath and wheezing.   Cardiovascular: Negative for chest pain, palpitations and leg swelling.  Gastrointestinal: Negative for abdominal pain, constipation, diarrhea and vomiting.       Gerd intermittently  Endocrine: Negative for polydipsia and polyuria.  Genitourinary: Negative for dysuria, frequency, genital sores, vaginal bleeding and vaginal discharge.  Musculoskeletal: Positive for arthralgias (intermittent knee pain). Negative for gait problem and joint swelling.  Skin: Negative for color change and rash.  Neurological: Negative  for dizziness, tremors, weakness, light-headedness and headaches.  Hematological: Negative for adenopathy. Does not bruise/bleed easily.   Psychiatric/Behavioral: Negative for dysphoric mood and sleep disturbance. The patient is not nervous/anxious.     Patient Active Problem List   Diagnosis Date Noted  . Prediabetes 02/09/2020  . Centrilobular emphysema ([place]) 10/27/2019  . Aortic atherosclerosis ([place]) 03/08/2019  . Osteopenia determined by x-ray 08/13/2017  . Alopecia areata 07/23/2016  . Heart palpitations 07/23/2016  . Xerosis cutis 04/11/2015  . Mixed hyperlipidemia 03/09/2015  . Tobacco use disorder, moderate, in sustained remission 03/09/2015  . History of vertebral compression fracture 03/09/2015  . History of colon polyps 03/09/2015  . Benign hypertension 03/09/2015  . Microscopic hematuria 03/09/2015    Allergies  Allergen Reactions  . Bupropion Other (See Comments)    Hair loss    Past Surgical History:  Procedure Laterality Date  . AUGMENTATION MAMMAPLASTY Bilateral 1988  . BREAST ENHANCEMENT SURGERY    . BUNIONECTOMY Bilateral   . CERVICAL CONE BIOPSY  1971   LSIL  . EYE SURGERY  Lens replacement  . facet shots in back  05/2016   [facility]-   . TUBAL LIGATION      Social History   Tobacco Use  . Smoking status: Former Smoker    Packs/day: 0.75    Years: 60.00    Pack years: 45.00    Types: Cigarettes    Quit date: 12/20/2017    Years since quitting: 2.6  . Smokeless tobacco: Never Used  Vaping Use  . Vaping Use: Never used  Substance Use Topics  . Alcohol use: Yes    Alcohol/week: 0.0 standard drinks    Comment: 2 drinks or less/month  . Drug use: No     Medication list has been reviewed and updated.  Current Meds  Medication Sig  . atorvastatin (LIPITOR) 10 MG tablet TAKE 1 TABLET(10 MG) BY MOUTH DAILY AT 6 PM  . Calcium Carbonate+Vitamin D 600-200 MG-UNIT TABS Take 1 tablet by mouth daily.  Marland Kitchen losartan (COZAAR) 50 MG tablet TAKE 1 TABLET BY MOUTH DAILY  . metoprolol succinate (TOPROL-XL) 25 MG 24 hr tablet Take 25 mg by mouth daily. Take 1/2 tablet daily  . Multiple  Vitamins-Minerals (MULTIVITAMIN WOMENS 50+ ADV PO) Take by mouth.  . triamcinolone cream (KENALOG) 0.1 % triamcinolone acetonide 0.1 % topical cream    PHQ 2/9 Scores 08/28/2020 08/15/2020 02/08/2020 08/24/2019  PHQ - 2 Score 0 0 0 0  PHQ- 9 Score 0 - 0 0    GAD 7 : Generalized Anxiety Score 08/28/2020 02/08/2020 08/24/2019 05/31/2019  Nervous, Anxious, on Edge 0 0 0 0  Control/stop worrying 0 0 0 0  Worry too much - different things 0 0 0 0  Trouble relaxing 0 0 0 0  Restless 0 0 0 0  Easily annoyed or irritable 0 0 0 0  Afraid - awful might happen 0 0 0 0  Total GAD 7 Score 0 0 0 0  Anxiety Difficulty - Not difficult at all Not difficult at all Not difficult at all    BP Readings from Last 3 Encounters:  08/28/20 116/74  02/08/20 124/68  08/24/19 128/60    Physical Exam Vitals and nursing note reviewed.  Constitutional:      General: She is not in acute distress.    Appearance: She is well-developed.  HENT:     Head: Normocephalic and atraumatic.     Right Ear: Tympanic membrane and ear canal normal.  Left Ear: Tympanic membrane and ear canal normal.     Nose:     Right Sinus: No maxillary sinus tenderness.     Left Sinus: No maxillary sinus tenderness.  Eyes:     General: No scleral icterus.       Right eye: No discharge.        Left eye: No discharge.     Conjunctiva/sclera: Conjunctivae normal.  Neck:     Thyroid: No thyromegaly.     Vascular: No carotid bruit.  Cardiovascular:     Rate and Rhythm: Normal rate and regular rhythm.     Pulses: Normal pulses.     Heart sounds: Normal heart sounds.  Pulmonary:     Effort: Pulmonary effort is normal. No respiratory distress.     Breath sounds: No wheezing.  Chest:  Breasts:     Right: No mass, nipple discharge, skin change or tenderness.     Left: No mass, nipple discharge, skin change or tenderness.    Abdominal:     General: Bowel sounds are normal.     Palpations: Abdomen is soft.     Tenderness: There is  no abdominal tenderness.  Musculoskeletal:     Cervical back: Normal range of motion. No erythema.     Right lower leg: No edema.     Left lower leg: No edema.  Lymphadenopathy:     Cervical: No cervical adenopathy.  Skin:    General: Skin is warm and dry.     Capillary Refill: Capillary refill takes less than 2 seconds.     Findings: Laceration present. No rash.     Comments: Healing laceration of the DIP of the right thumb  Neurological:     General: No focal deficit present.     Mental Status: She is alert and oriented to person, place, and time.     Cranial Nerves: No cranial nerve deficit.     Sensory: No sensory deficit.     Deep Tendon Reflexes: Reflexes are normal and symmetric.  Psychiatric:        Attention and Perception: Attention normal.        Mood and Affect: Mood normal.     Wt Readings from Last 3 Encounters:  08/28/20 177 lb (80.3 kg)  02/08/20 177 lb (80.3 kg)  10/25/19 171 lb (77.6 kg)    BP 116/74   Pulse 60   Temp 97.6 F (36.4 C) (Oral)   Ht 5\' 6"  (1.676 m)   Wt 177 lb (80.3 kg)   SpO2 98%   BMI 28.57 kg/m   Assessment and Plan: 1. Annual physical exam Normal exam; continue exercise and diet changes  2. Benign hypertension Clinically stable exam with well controlled BP. Tolerating medications without side effects at this time. Pt to continue current regimen and low sodium diet; benefits of regular exercise as able discussed. - CBC with Differential/Platelet - Comprehensive metabolic panel - TSH - POCT urinalysis dipstick  3. Aortic atherosclerosis (HCC) On statin therapy for atherosclerosis seen on CT  4. Mixed hyperlipidemia Tolerating statin medication without side effects at this time LDL is at goal of < 70 on current dose Continue same therapy without change at this time. - Lipid panel - atorvastatin (LIPITOR) 10 MG tablet; TAKE 1 TABLET(10 MG) BY MOUTH DAILY AT 6 PM  Dispense: 90 tablet; Refill: 3  5. Prediabetes Continue low  carb diet choices; Will advise after labs. - Hemoglobin A1c  6. Osteopenia determined by x-ray Continue calcium  and vitamin D Repeat DEXA in 1-2 years - VITAMIN D 25 Hydroxy (Vit-D Deficiency, Fractures)  7. Centrilobular emphysema ([place]) Seen on CT screening Pt is asymptomatic; quit smoking 3 years ago.  8. Laceration of skin of finger, initial encounter Due for Tetanus booster - Tdap vaccine greater than or equal to 7yo IM   Partially dictated using Editor, commissioning. Any errors are unintentional.  Halina Maidens, MD [facility]  08/28/2020

## 2020-08-29 LAB — COMPREHENSIVE METABOLIC PANEL
ALT: 18 IU/L (ref 0–32)
AST: 20 IU/L (ref 0–40)
Albumin/Globulin Ratio: 1.6 (ref 1.2–2.2)
Albumin: 4.5 g/dL (ref 3.7–4.7)
Alkaline Phosphatase: 77 IU/L (ref 44–121)
BUN/Creatinine Ratio: 16 (ref 12–28)
BUN: 13 mg/dL (ref 8–27)
Bilirubin Total: 0.7 mg/dL (ref 0.0–1.2)
CO2: 20 mmol/L (ref 20–29)
Calcium: 9.7 mg/dL (ref 8.7–10.3)
Chloride: 97 mmol/L (ref 96–106)
Creatinine, Ser: 0.82 mg/dL (ref 0.57–1.00)
Globulin, Total: 2.8 g/dL (ref 1.5–4.5)
Glucose: 112 mg/dL — ABNORMAL HIGH (ref 65–99)
Potassium: 4.8 mmol/L (ref 3.5–5.2)
Sodium: 132 mmol/L — ABNORMAL LOW (ref 134–144)
Total Protein: 7.3 g/dL (ref 6.0–8.5)
eGFR: 74 mL/min/{1.73_m2} (ref >59)

## 2020-08-29 LAB — LIPID PANEL
Chol/HDL Ratio: 2.8 ratio (ref 0.0–4.4)
Cholesterol, Total: 137 mg/dL (ref 100–199)
HDL: 49 mg/dL (ref >39)
LDL Chol Calc (NIH): 68 mg/dL (ref 0–99)
Triglycerides: 108 mg/dL (ref 0–149)
VLDL Cholesterol Cal: 20 mg/dL (ref 5–40)

## 2020-08-29 LAB — CBC WITH DIFFERENTIAL/PLATELET
Basophils Absolute: 0 10*3/uL (ref 0.0–0.2)
Basos: 0 %
EOS (ABSOLUTE): 0 10*3/uL (ref 0.0–0.4)
Eos: 1 %
Hematocrit: 45.2 % (ref 34.0–46.6)
Hemoglobin: 14.9 g/dL (ref 11.1–15.9)
Immature Grans (Abs): 0 10*3/uL (ref 0.0–0.1)
Immature Granulocytes: 0 %
Lymphocytes Absolute: 1.4 10*3/uL (ref 0.7–3.1)
Lymphs: 23 %
MCH: 29.2 pg (ref 26.6–33.0)
MCHC: 33 g/dL (ref 31.5–35.7)
MCV: 89 fL (ref 79–97)
Monocytes Absolute: 0.5 10*3/uL (ref 0.1–0.9)
Monocytes: 8 %
Neutrophils Absolute: 4.1 10*3/uL (ref 1.4–7.0)
Neutrophils: 68 %
Platelets: 210 10*3/uL (ref 150–450)
RBC: 5.11 x10E6/uL (ref 3.77–5.28)
RDW: 13 % (ref 11.7–15.4)
WBC: 6.1 10*3/uL (ref 3.4–10.8)

## 2020-08-29 LAB — HEMOGLOBIN A1C
Est. average glucose Bld gHb Est-mCnc: 111 mg/dL
Hgb A1c MFr Bld: 5.5 % (ref 4.8–5.6)

## 2020-08-29 LAB — VITAMIN D 25 HYDROXY (VIT D DEFICIENCY, FRACTURES): Vit D, 25-Hydroxy: 36.4 ng/mL (ref 30.0–100.0)

## 2020-08-29 LAB — TSH: TSH: 2.72 u[IU]/mL (ref 0.450–4.500)

## 2020-09-18 DIAGNOSIS — H1031 Unspecified acute conjunctivitis, right eye: Secondary | ICD-10-CM | POA: Diagnosis not present

## 2020-09-19 DIAGNOSIS — H04123 Dry eye syndrome of bilateral lacrimal glands: Secondary | ICD-10-CM | POA: Diagnosis not present

## 2020-09-19 DIAGNOSIS — H1031 Unspecified acute conjunctivitis, right eye: Secondary | ICD-10-CM | POA: Diagnosis not present

## 2020-09-19 DIAGNOSIS — H40053 Ocular hypertension, bilateral: Secondary | ICD-10-CM | POA: Diagnosis not present

## 2020-09-19 DIAGNOSIS — Z961 Presence of intraocular lens: Secondary | ICD-10-CM | POA: Diagnosis not present

## 2020-09-20 ENCOUNTER — Other Ambulatory Visit: Payer: Self-pay

## 2020-09-20 ENCOUNTER — Ambulatory Visit
Admission: RE | Admit: 2020-09-20 | Discharge: 2020-09-20 | Disposition: A | Discharge status: Home / Self Care | Payer: Medicare Other | Source: Ambulatory Visit | Attending: Internal Medicine | Admitting: Internal Medicine

## 2020-09-20 DIAGNOSIS — Z1231 Encounter for screening mammogram for malignant neoplasm of breast: Secondary | ICD-10-CM | POA: Diagnosis not present

## 2020-09-24 DIAGNOSIS — M5416 Radiculopathy, lumbar region: Secondary | ICD-10-CM | POA: Diagnosis not present

## 2020-10-01 DIAGNOSIS — M5416 Radiculopathy, lumbar region: Secondary | ICD-10-CM | POA: Diagnosis not present

## 2020-11-04 ENCOUNTER — Other Ambulatory Visit: Payer: Self-pay | Admitting: Internal Medicine

## 2020-11-04 DIAGNOSIS — I10 Essential (primary) hypertension: Secondary | ICD-10-CM

## 2020-11-04 NOTE — Telephone Encounter (Signed)
Requested Prescriptions  Pending Prescriptions Disp Refills  . losartan (COZAAR) 50 MG tablet [Pharmacy Med Name: LOSARTAN '50MG'$  TABLETS] 90 tablet 1    Sig: TAKE 1 TABLET BY MOUTH DAILY     Cardiovascular:  Angiotensin Receptor Blockers Passed - 11/04/2020  4:40 PM      Passed - Cr in normal range and within 180 days    Creatinine, Ser  Date Value Ref Range Status  08/28/2020 0.82 0.57 - 1.00 mg/dL Final         Passed - K in normal range and within 180 days    Potassium  Date Value Ref Range Status  08/28/2020 4.8 3.5 - 5.2 mmol/L Final         Passed - Patient is not pregnant      Passed - Last BP in normal range    BP Readings from Last 1 Encounters:  08/28/20 116/74         Passed - Valid encounter within last 6 months    Recent Outpatient Visits          2 months ago Annual physical exam   [facility] Glean [facility], [facility]   9 months ago Benign hypertension   [facility] Glean Hess, MD   1 year ago Benign hypertension   [facility] Glean Hess, [facility]   1 year ago Benign hypertension   [facility] Glean Hess, MD   1 year ago Benign hypertension   [facility] Glean Hess, MD      Future Appointments            In 3 months Army Melia Jesse Sans, MD [facility], [place]   In 10 months Army Melia Jesse Sans, MD [facility], [facility]

## 2020-11-05 DIAGNOSIS — M48062 Spinal stenosis, lumbar region with neurogenic claudication: Secondary | ICD-10-CM | POA: Diagnosis not present

## 2020-11-05 DIAGNOSIS — M47817 Spondylosis without myelopathy or radiculopathy, lumbosacral region: Secondary | ICD-10-CM | POA: Diagnosis not present

## 2020-11-07 DIAGNOSIS — L219 Seborrheic dermatitis, unspecified: Secondary | ICD-10-CM | POA: Diagnosis not present

## 2020-11-21 DIAGNOSIS — M48062 Spinal stenosis, lumbar region with neurogenic claudication: Secondary | ICD-10-CM | POA: Diagnosis not present

## 2020-11-28 ENCOUNTER — Telehealth: Payer: Self-pay | Admitting: *Deleted

## 2020-11-28 DIAGNOSIS — Z87891 Personal history of nicotine dependence: Secondary | ICD-10-CM

## 2020-11-28 NOTE — Telephone Encounter (Signed)
New low dose CT order placed. Will forward to [facility] to contact pt to schedule f/u Ct.

## 2020-11-28 NOTE — Telephone Encounter (Signed)
Pt left message asking if she can get scheduled for her next lung cancer screening study. Last LDCT was 10/25/2019. You can reach her at [phone] to schedule.

## 2020-11-28 NOTE — Addendum Note (Signed)
Addended by: Doroteo Glassman D on: 11/28/2020 04:11 PM   Modules accepted: Orders

## 2020-12-05 DIAGNOSIS — H1031 Unspecified acute conjunctivitis, right eye: Secondary | ICD-10-CM | POA: Diagnosis not present

## 2020-12-05 DIAGNOSIS — H04123 Dry eye syndrome of bilateral lacrimal glands: Secondary | ICD-10-CM | POA: Diagnosis not present

## 2020-12-05 DIAGNOSIS — H40053 Ocular hypertension, bilateral: Secondary | ICD-10-CM | POA: Diagnosis not present

## 2020-12-05 DIAGNOSIS — Z961 Presence of intraocular lens: Secondary | ICD-10-CM | POA: Diagnosis not present

## 2020-12-06 ENCOUNTER — Ambulatory Visit
Admission: RE | Admit: 2020-12-06 | Discharge: 2020-12-06 | Disposition: A | Discharge status: Home / Self Care | Payer: Medicare Other | Source: Ambulatory Visit | Attending: Acute Care | Admitting: Acute Care

## 2020-12-06 ENCOUNTER — Other Ambulatory Visit: Payer: Self-pay

## 2020-12-06 DIAGNOSIS — Z87891 Personal history of nicotine dependence: Secondary | ICD-10-CM | POA: Diagnosis not present

## 2020-12-14 ENCOUNTER — Encounter: Payer: Self-pay | Admitting: *Deleted

## 2020-12-14 DIAGNOSIS — Z87891 Personal history of nicotine dependence: Secondary | ICD-10-CM

## 2020-12-17 DIAGNOSIS — M48062 Spinal stenosis, lumbar region with neurogenic claudication: Secondary | ICD-10-CM | POA: Diagnosis not present

## 2020-12-17 DIAGNOSIS — M47817 Spondylosis without myelopathy or radiculopathy, lumbosacral region: Secondary | ICD-10-CM | POA: Diagnosis not present

## 2021-01-03 DIAGNOSIS — H1031 Unspecified acute conjunctivitis, right eye: Secondary | ICD-10-CM | POA: Diagnosis not present

## 2021-01-03 DIAGNOSIS — H04123 Dry eye syndrome of bilateral lacrimal glands: Secondary | ICD-10-CM | POA: Diagnosis not present

## 2021-01-03 DIAGNOSIS — H40053 Ocular hypertension, bilateral: Secondary | ICD-10-CM | POA: Diagnosis not present

## 2021-01-03 DIAGNOSIS — Z961 Presence of intraocular lens: Secondary | ICD-10-CM | POA: Diagnosis not present

## 2021-01-08 NOTE — Progress Notes (Signed)
Result letter sent by Doroteo Glassman RN on 12/14/2020

## 2021-01-22 DIAGNOSIS — H1031 Unspecified acute conjunctivitis, right eye: Secondary | ICD-10-CM | POA: Diagnosis not present

## 2021-01-22 DIAGNOSIS — H40053 Ocular hypertension, bilateral: Secondary | ICD-10-CM | POA: Diagnosis not present

## 2021-01-22 DIAGNOSIS — H04123 Dry eye syndrome of bilateral lacrimal glands: Secondary | ICD-10-CM | POA: Diagnosis not present

## 2021-01-22 DIAGNOSIS — Z961 Presence of intraocular lens: Secondary | ICD-10-CM | POA: Diagnosis not present

## 2021-02-28 ENCOUNTER — Ambulatory Visit: Payer: Medicare Other | Admitting: Internal Medicine

## 2021-03-11 ENCOUNTER — Other Ambulatory Visit: Payer: Self-pay

## 2021-03-11 ENCOUNTER — Ambulatory Visit (INDEPENDENT_AMBULATORY_CARE_PROVIDER_SITE_OTHER): Payer: Medicare Other | Admitting: Internal Medicine

## 2021-03-11 ENCOUNTER — Encounter: Payer: Self-pay | Admitting: Internal Medicine

## 2021-03-11 VITALS — BP 136/78 | HR 55 | Temp 97.5°F | Ht 66.0 in | Wt 177.0 lb

## 2021-03-11 DIAGNOSIS — I700 Atherosclerosis of aorta: Secondary | ICD-10-CM | POA: Diagnosis not present

## 2021-03-11 DIAGNOSIS — R7303 Prediabetes: Secondary | ICD-10-CM

## 2021-03-11 DIAGNOSIS — I2510 Atherosclerotic heart disease of native coronary artery without angina pectoris: Secondary | ICD-10-CM | POA: Diagnosis not present

## 2021-03-11 DIAGNOSIS — I10 Essential (primary) hypertension: Secondary | ICD-10-CM | POA: Diagnosis not present

## 2021-03-11 MED ORDER — ASPIRIN 81 MG PO TBEC: 81.0000 mg | DELAYED_RELEASE_TABLET | Freq: Every day | ORAL | 12 refills | Status: AC

## 2021-03-11 NOTE — Patient Instructions (Signed)
Start Baby Aspirin 81 mg daily

## 2021-03-11 NOTE — Progress Notes (Signed)
Date:  03/11/2021   Name:  Melinda Perry   DOB:  July 25, 1945   MRN:  [id number]   Chief Complaint: Hypertension and Prediabetes  Hypertension Pertinent negatives include no chest pain, headaches, palpitations or shortness of breath. Past treatments include angiotensin blockers and beta blockers. The current treatment provides significant improvement.  Diabetes She presents for her follow-up diabetic visit. Diabetes type: prediabetes. Her disease course has been stable. Pertinent negatives for hypoglycemia include no dizziness, headaches or nervousness/anxiousness. Pertinent negatives for diabetes include no chest pain, no fatigue and no weakness.  Hyperlipidemia This is a chronic problem. The problem is controlled. Pertinent negatives include no chest pain or shortness of breath. Current antihyperlipidemic treatment includes statins.   IMPRESSION: 1. Lung-RADS 2S, benign appearance or behavior. Continue annual screening with low-dose chest CT without contrast in 12 months. 2. The "S" modifier above refers to potentially clinically significant non lung cancer related findings. Specifically, there is aortic atherosclerosis, in addition to 3 vessel coronary artery disease. Please note that although the presence of coronary artery calcium documents the presence of coronary artery disease, the severity of this disease and any potential stenosis cannot be assessed on this non-gated CT examination. Assessment for potential risk factor modification, dietary therapy or pharmacologic therapy may be warranted, if clinically indicated. 3. Mild diffuse bronchial wall thickening with mild centrilobular and paraseptal emphysema; imaging findings suggestive of underlying COPD.   Aortic Atherosclerosis (ICD10-I70.0) and Emphysema (ICD10-J43.9). Lab Results  Component Value Date   NA 132 (L) 08/28/2020   K 4.8 08/28/2020   CO2 20 08/28/2020   GLUCOSE 112 (H) 08/28/2020   BUN 13  08/28/2020   CREATININE 0.82 08/28/2020   CALCIUM 9.7 08/28/2020   EGFR 74 08/28/2020   GFRNONAA 65 02/08/2020   Lab Results  Component Value Date   CHOL 137 08/28/2020   HDL 49 08/28/2020   LDLCALC 68 08/28/2020   TRIG 108 08/28/2020   CHOLHDL 2.8 08/28/2020   Lab Results  Component Value Date   TSH 2.720 08/28/2020   Lab Results  Component Value Date   HGBA1C 5.5 08/28/2020   Lab Results  Component Value Date   WBC 6.1 08/28/2020   HGB 14.9 08/28/2020   HCT 45.2 08/28/2020   MCV 89 08/28/2020   PLT 210 08/28/2020   Lab Results  Component Value Date   ALT 18 08/28/2020   AST 20 08/28/2020   ALKPHOS 77 08/28/2020   BILITOT 0.7 08/28/2020   Lab Results  Component Value Date   VD25OH 36.4 08/28/2020     Review of Systems  Constitutional:  Negative for fatigue and unexpected weight change.  HENT:  Negative for nosebleeds.   Eyes:  Negative for visual disturbance.  Respiratory:  Negative for cough, chest tightness, shortness of breath and wheezing.   Cardiovascular:  Negative for chest pain, palpitations and leg swelling.  Gastrointestinal:  Negative for abdominal pain, constipation and diarrhea.  Musculoskeletal:  Negative for gait problem.  Neurological:  Negative for dizziness, weakness, light-headedness and headaches.  Psychiatric/Behavioral:  Negative for dysphoric mood and sleep disturbance. The patient is not nervous/anxious.    Patient Active Problem List   Diagnosis Date Noted   Prediabetes 02/09/2020   Centrilobular emphysema ([place]) 10/27/2019   Aortic atherosclerosis ([place]) 03/08/2019   Osteopenia determined by x-ray 08/13/2017   Alopecia areata 07/23/2016   Heart palpitations 07/23/2016   Xerosis cutis 04/11/2015   Mixed hyperlipidemia 03/09/2015   Tobacco use disorder, moderate, in sustained remission  03/09/2015   History of vertebral compression fracture 03/09/2015   History of colon polyps 03/09/2015   Benign hypertension 03/09/2015    Microscopic hematuria 03/09/2015    Allergies  Allergen Reactions   Bupropion Other (See Comments)    Hair loss    Past Surgical History:  Procedure Laterality Date   AUGMENTATION MAMMAPLASTY Bilateral 1988   BREAST ENHANCEMENT SURGERY     BUNIONECTOMY Bilateral    CERVICAL CONE BIOPSY  1971   LSIL   EYE SURGERY  Lens replacement   facet shots in back  05/2016   [facility]-    TUBAL LIGATION      Social History   Tobacco Use   Smoking status: Former    Packs/day: 0.75    Years: 60.00    Pack years: 45.00    Types: Cigarettes    Quit date: 12/20/2017    Years since quitting: 3.2   Smokeless tobacco: Never  Vaping Use   Vaping Use: Never used  Substance Use Topics   Alcohol use: Yes    Alcohol/week: 0.0 standard drinks    Comment: 2 drinks or less/month   Drug use: No     Medication list has been reviewed and updated.  Current Meds  Medication Sig   aspirin 81 MG EC tablet Take 1 tablet (81 mg total) by mouth daily. Swallow whole.   atorvastatin (LIPITOR) 10 MG tablet TAKE 1 TABLET(10 MG) BY MOUTH DAILY AT 6 PM   Calcium Carbonate+Vitamin D 600-200 MG-UNIT TABS Take 1 tablet by mouth daily.   Fluocinolone Acetonide 0.01 % OIL fluocinolone acetonide oil 0.01 % ear drops  APPLY TO BOTH EARS TWICE DAILY AS NEEDED   hydrocortisone 2.5 % cream as needed.   ketoconazole (NIZORAL) 2 % cream as needed.   losartan (COZAAR) 50 MG tablet TAKE 1 TABLET BY MOUTH DAILY   metoprolol succinate (TOPROL-XL) 25 MG 24 hr tablet Take 25 mg by mouth daily. Take 1/2 tablet daily   Multiple Vitamins-Minerals (MULTIVITAMIN WOMENS 50+ ADV PO) Take by mouth.   triamcinolone cream (KENALOG) 0.1 % triamcinolone acetonide 0.1 % topical cream    PHQ 2/9 Scores 03/11/2021 08/28/2020 08/15/2020 02/08/2020  PHQ - 2 Score 0 0 0 0  PHQ- 9 Score 0 0 - 0    GAD 7 : Generalized Anxiety Score 03/11/2021 08/28/2020 02/08/2020 08/24/2019  Nervous, Anxious, on Edge 0 0 0 0  Control/stop worrying 0  0 0 0  Worry too much - different things 0 0 0 0  Trouble relaxing 0 0 0 0  Restless 0 0 0 0  Easily annoyed or irritable 0 0 0 0  Afraid - awful might happen 0 0 0 0  Total GAD 7 Score 0 0 0 0  Anxiety Difficulty Not difficult at all - Not difficult at all Not difficult at all    BP Readings from Last 3 Encounters:  03/11/21 136/78  08/28/20 116/74  02/08/20 124/68    Physical Exam Vitals and nursing note reviewed.  Constitutional:      General: She is not in acute distress.    Appearance: She is well-developed.  HENT:     Head: Normocephalic and atraumatic.  Neck:     Vascular: No carotid bruit.  Cardiovascular:     Rate and Rhythm: Normal rate and regular rhythm.     Pulses: Normal pulses.     Heart sounds: No murmur heard. Pulmonary:     Effort: Pulmonary effort is normal. No respiratory  distress.     Breath sounds: No wheezing or rhonchi.  Musculoskeletal:     Right lower leg: No edema.     Left lower leg: No edema.  Lymphadenopathy:     Cervical: No cervical adenopathy.  Skin:    General: Skin is warm and dry.     Capillary Refill: Capillary refill takes less than 2 seconds.     Findings: No rash.  Neurological:     General: No focal deficit present.     Mental Status: She is alert and oriented to person, place, and time.  Psychiatric:        Mood and Affect: Mood normal.        Behavior: Behavior normal.    Wt Readings from Last 3 Encounters:  03/11/21 177 lb (80.3 kg)  08/28/20 177 lb (80.3 kg)  02/08/20 177 lb (80.3 kg)    BP 136/78   Pulse (!) 55   Temp (!) 97.5 F (36.4 C) (Oral)   Ht '5\' 6"'  (1.676 m)   Wt 177 lb (80.3 kg)   SpO2 98%   BMI 28.57 kg/m   Assessment and Plan: 1. Benign hypertension Clinically stable exam with well controlled BP. Tolerating medications without side effects at this time. Pt to continue current regimen and low sodium diet; benefits of regular exercise as able discussed. - Basic metabolic panel  2. Aortic  atherosclerosis ([place]) On statin therapy with LDL at goal <70  3. Prediabetes Continue diet and exercise - Hemoglobin A1c  4. Atherosclerosis of native coronary artery of native heart without angina pectoris Seen on CT Lung cancer screening.  No chest pain or SOB. Begin ASA 81 mg daily and refer for testing - Ambulatory referral to Cardiology - aspirin 81 MG EC tablet; Take 1 tablet (81 mg total) by mouth daily. Swallow whole.  Dispense: 30 tablet; Refill: 12   Partially dictated using Editor, commissioning. Any errors are unintentional.  Halina Maidens, MD [facility]  03/11/2021

## 2021-03-12 LAB — BASIC METABOLIC PANEL
BUN/Creatinine Ratio: 16 (ref 12–28)
BUN: 16 mg/dL (ref 8–27)
CO2: 21 mmol/L (ref 20–29)
Calcium: 10.1 mg/dL (ref 8.7–10.3)
Chloride: 101 mmol/L (ref 96–106)
Creatinine, Ser: 1 mg/dL (ref 0.57–1.00)
Glucose: 96 mg/dL (ref 70–99)
Potassium: 4.9 mmol/L (ref 3.5–5.2)
Sodium: 137 mmol/L (ref 134–144)
eGFR: 58 mL/min/{1.73_m2} — ABNORMAL LOW (ref >59)

## 2021-03-12 LAB — HEMOGLOBIN A1C
Est. average glucose Bld gHb Est-mCnc: 100 mg/dL
Hgb A1c MFr Bld: 5.1 % (ref 4.8–5.6)

## 2021-03-14 DIAGNOSIS — Z87891 Personal history of nicotine dependence: Secondary | ICD-10-CM | POA: Diagnosis not present

## 2021-03-14 DIAGNOSIS — I10 Essential (primary) hypertension: Secondary | ICD-10-CM | POA: Diagnosis not present

## 2021-03-14 DIAGNOSIS — R002 Palpitations: Secondary | ICD-10-CM | POA: Diagnosis not present

## 2021-03-14 DIAGNOSIS — I2510 Atherosclerotic heart disease of native coronary artery without angina pectoris: Secondary | ICD-10-CM | POA: Diagnosis not present

## 2021-04-05 DIAGNOSIS — I2510 Atherosclerotic heart disease of native coronary artery without angina pectoris: Secondary | ICD-10-CM | POA: Diagnosis not present

## 2021-04-11 DIAGNOSIS — Z87891 Personal history of nicotine dependence: Secondary | ICD-10-CM | POA: Diagnosis not present

## 2021-04-11 DIAGNOSIS — I10 Essential (primary) hypertension: Secondary | ICD-10-CM | POA: Diagnosis not present

## 2021-04-11 DIAGNOSIS — I493 Ventricular premature depolarization: Secondary | ICD-10-CM | POA: Diagnosis not present

## 2021-04-11 DIAGNOSIS — I2510 Atherosclerotic heart disease of native coronary artery without angina pectoris: Secondary | ICD-10-CM | POA: Diagnosis not present

## 2021-04-27 ENCOUNTER — Other Ambulatory Visit: Payer: Self-pay | Admitting: Internal Medicine

## 2021-04-27 DIAGNOSIS — I10 Essential (primary) hypertension: Secondary | ICD-10-CM

## 2021-04-28 NOTE — Telephone Encounter (Signed)
Requested Prescriptions  Pending Prescriptions Disp Refills   losartan (COZAAR) 50 MG tablet [Pharmacy Med Name: LOSARTAN 50MG  TABLETS] 90 tablet 1    Sig: TAKE 1 TABLET BY MOUTH DAILY     Cardiovascular:  Angiotensin Receptor Blockers Passed - 04/27/2021  5:36 PM      Passed - Cr in normal range and within 180 days    Creatinine, Ser  Date Value Ref Range Status  03/11/2021 1.00 0.57 - 1.00 mg/dL Final         Passed - K in normal range and within 180 days    Potassium  Date Value Ref Range Status  03/11/2021 4.9 3.5 - 5.2 mmol/L Final         Passed - Patient is not pregnant      Passed - Last BP in normal range    BP Readings from Last 1 Encounters:  03/11/21 136/78         Passed - Valid encounter within last 6 months    Recent Outpatient Visits          1 month ago Benign hypertension   [facility] Glean Hess, MD   8 months ago Annual physical exam   [facility] Glean Hess, MD   1 year ago Benign hypertension   [facility] Glean Hess, MD   1 year ago Benign hypertension   [facility] Glean Hess, MD   1 year ago Benign hypertension   [facility] Glean Hess, MD      Future Appointments            In 4 months Army Melia Jesse Sans, MD [facility], [facility]

## 2021-06-19 DIAGNOSIS — M48062 Spinal stenosis, lumbar region with neurogenic claudication: Secondary | ICD-10-CM | POA: Diagnosis not present

## 2021-07-24 DIAGNOSIS — M48062 Spinal stenosis, lumbar region with neurogenic claudication: Secondary | ICD-10-CM | POA: Diagnosis not present

## 2021-07-24 DIAGNOSIS — M47817 Spondylosis without myelopathy or radiculopathy, lumbosacral region: Secondary | ICD-10-CM | POA: Diagnosis not present

## 2021-07-25 DIAGNOSIS — Z961 Presence of intraocular lens: Secondary | ICD-10-CM | POA: Diagnosis not present

## 2021-07-25 DIAGNOSIS — H1031 Unspecified acute conjunctivitis, right eye: Secondary | ICD-10-CM | POA: Diagnosis not present

## 2021-07-25 DIAGNOSIS — H40053 Ocular hypertension, bilateral: Secondary | ICD-10-CM | POA: Diagnosis not present

## 2021-07-25 DIAGNOSIS — H04123 Dry eye syndrome of bilateral lacrimal glands: Secondary | ICD-10-CM | POA: Diagnosis not present

## 2021-07-26 ENCOUNTER — Other Ambulatory Visit: Payer: Self-pay | Admitting: Internal Medicine

## 2021-07-26 DIAGNOSIS — I10 Essential (primary) hypertension: Secondary | ICD-10-CM

## 2021-07-26 NOTE — Telephone Encounter (Signed)
Requested Prescriptions  ?Pending Prescriptions Disp Refills  ?? losartan (COZAAR) 50 MG tablet [Pharmacy Med Name: LOSARTAN '50MG'$  TABLETS] 90 tablet 0  ?  Sig: TAKE 1 TABLET BY MOUTH DAILY  ?  ? Cardiovascular:  Angiotensin Receptor Blockers Passed - 07/26/2021  3:32 AM  ?  ?  Passed - Cr in normal range and within 180 days  ?  Creatinine, Ser  ?Date Value Ref Range Status  ?03/11/2021 1.00 0.57 - 1.00 mg/dL Final  ?   ?  ?  Passed - K in normal range and within 180 days  ?  Potassium  ?Date Value Ref Range Status  ?03/11/2021 4.9 3.5 - 5.2 mmol/L Final  ?   ?  ?  Passed - Patient is not pregnant  ?  ?  Passed - Last BP in normal range  ?  BP Readings from Last 1 Encounters:  ?03/11/21 136/78  ?   ?  ?  Passed - Valid encounter within last 6 months  ?  Recent Outpatient Visits   ?      ? 4 months ago Benign hypertension  ? [facility] Glean Hess, MD  ? 11 months ago Annual physical exam  ? [facility] Glean Hess, MD  ? 1 year ago Benign hypertension  ? [facility] Glean Hess, MD  ? 1 year ago Benign hypertension  ? [facility] Glean Hess, MD  ? 2 years ago Benign hypertension  ? [facility] Glean Hess, MD  ?  ?  ?Future Appointments   ?        ? In 1 month Army Melia Jesse Sans, MD [facility], [place]  ?  ? ?  ?  ?  ? ?

## 2021-08-13 ENCOUNTER — Other Ambulatory Visit: Payer: Self-pay | Admitting: Internal Medicine

## 2021-08-13 DIAGNOSIS — Z1231 Encounter for screening mammogram for malignant neoplasm of breast: Secondary | ICD-10-CM

## 2021-08-16 ENCOUNTER — Other Ambulatory Visit: Payer: Self-pay

## 2021-08-19 ENCOUNTER — Ambulatory Visit (INDEPENDENT_AMBULATORY_CARE_PROVIDER_SITE_OTHER): Payer: Medicare Other

## 2021-08-19 DIAGNOSIS — Z0000 Encounter for general adult medical examination without abnormal findings: Secondary | ICD-10-CM

## 2021-08-19 NOTE — Progress Notes (Signed)
Subjective:   Melinda Perry is a 76 y.o. female who presents for Medicare Annual (Subsequent) preventive examination.  Virtual Visit via Telephone Note  I connected with  Melinda Perry on 08/19/21 at 10:00 AM EDT by telephone and verified that I am speaking with the correct person using two identifiers.  Location: Patient: home Provider: [facility] Persons participating in the virtual visit: [place]   I discussed the limitations, risks, security and privacy concerns of performing an evaluation and management service by telephone and the availability of in person appointments. The patient expressed understanding and agreed to proceed.  Interactive audio and video telecommunications were attempted between this nurse and patient, however failed, due to patient having technical difficulties OR patient did not have access to video capability.  We continued and completed visit with audio only.  Some vital signs may be absent or patient reported.   Melinda Marker, LPN   Review of Systems     Cardiac Risk Factors include: advanced age (>47mn, >>30women);dyslipidemia;hypertension     Objective:    There were no vitals filed for this visit. There is no height or weight on file to calculate BMI.     08/19/2021   10:09 AM 08/15/2020   10:18 AM 05/30/2019    8:12 PM 08/09/2018   10:14 AM 08/05/2017   10:10 AM 01/26/2017    4:00 PM 07/23/2016    9:30 AM  Advanced Directives  Does Patient Have a Medical Advance Directive? Yes Yes No Yes Yes No Yes  Type of AParamedicof AMerrillLiving will [place] IsleLiving will  Living will;Healthcare Power of ABay MinetteLiving will  HKelayresin Chart? Yes - validated most recent copy scanned in chart (See row information) Yes - validated most recent copy scanned in chart (See row information)  Yes -  validated most recent copy scanned in chart (See row information) No - copy requested  No - copy requested    Current Medications (verified) Outpatient Encounter Medications as of 08/19/2021  Medication Sig   aspirin 81 MG EC tablet Take 1 tablet (81 mg total) by mouth daily. Swallow whole.   atorvastatin (LIPITOR) 10 MG tablet TAKE 1 TABLET(10 MG) BY MOUTH DAILY AT 6 PM   Calcium Carbonate+Vitamin D 600-200 MG-UNIT TABS Take 1 tablet by mouth daily.   Fluocinolone Acetonide 0.01 % OIL fluocinolone acetonide oil 0.01 % ear drops  APPLY TO BOTH EARS TWICE DAILY AS NEEDED   hydrocortisone 2.5 % cream as needed.   losartan (COZAAR) 50 MG tablet TAKE 1 TABLET BY MOUTH DAILY   metoprolol succinate (TOPROL-XL) 25 MG 24 hr tablet Take 25 mg by mouth daily. Take 1/2 tablet daily   Multiple Vitamins-Minerals (MULTIVITAMIN WOMENS 50+ ADV PO) Take by mouth.   RESTASIS MULTIDOSE 0.05 % ophthalmic emulsion 1 drop 2 (two) times daily.   triamcinolone cream (KENALOG) 0.1 % triamcinolone acetonide 0.1 % topical cream   [DISCONTINUED] ketoconazole (NIZORAL) 2 % cream as needed.   [DISCONTINUED] neomycin-polymyxin-dexamethasone (MAXITROL) 0.1 % ophthalmic suspension neomycin-polymyxin-dexameth 3.5 mg/mL-10,000 unit/mL-0.1% eye drops   [DISCONTINUED] omeprazole (PRILOSEC) 10 MG capsule Take 10 mg by mouth daily. TEMPERARY- OTC for Keto Diet   No facility-administered encounter medications on file as of 08/19/2021.    Allergies (verified) Bupropion   History: Past Medical History:  Diagnosis Date   Ankle pain 04/11/2015   [place] -squamous cell on face   Centrilobular emphysema (HCC) 10/27/2019   High cholesterol    Hypertension    PVC (premature ventricular contraction)    Past Surgical History:  Procedure Laterality Date   AUGMENTATION MAMMAPLASTY Bilateral 1988   BREAST ENHANCEMENT SURGERY     BUNIONECTOMY Bilateral    CERVICAL CONE BIOPSY  1971   LSIL   EYE SURGERY  Lens  replacement   facet shots in back  05/2016   [facility]-    TUBAL LIGATION     Family History  Problem Relation Age of Onset   Lung cancer Mother    Alcohol abuse Mother    Cancer Mother    Pulmonary fibrosis Father    Alcohol abuse Sister    Breast cancer Neg Hx    Social History   Socioeconomic History   Marital status: Divorced    Spouse name: Not on file   Number of children: 2   Years of education: some college   Highest education level: 12th grade  Occupational History   Occupation: Retired  Tobacco Use   Smoking status: Former    Packs/day: 0.75    Years: 60.00    Pack years: 45.00    Types: Cigarettes    Quit date: 12/20/2017    Years since quitting: 3.6   Smokeless tobacco: Never  Vaping Use   Vaping Use: Never used  Substance and Sexual Activity   Alcohol use: Yes    Comment: 2 drinks or less/month   Drug use: No   Sexual activity: Not Currently    Birth control/protection: None  Other Topics Concern   Not on file  Social History Narrative   Pt lives alone   Social Determinants of Health   Financial Resource Strain: Low Risk    Difficulty of Paying Living Expenses: Not hard at all  Food Insecurity: No Food Insecurity   Worried About Charity fundraiser in the Last Year: Never true   Ran Out of Food in the Last Year: Never true  Transportation Needs: No Transportation Needs   Lack of Transportation (Medical): No   Lack of Transportation (Non-Medical): No  Physical Activity: Sufficiently Active   Days of Exercise per Week: 5 days   Minutes of Exercise per Session: 40 min  Stress: No Stress Concern Present   Feeling of Stress : Not at all  Social Connections: Socially Isolated   Frequency of Communication with Friends and Family: More than three times a week   Frequency of Social Gatherings with Friends and Family: More than three times a week   Attends Religious Services: Never   Marine scientist or Organizations: No   Attends Programme researcher, broadcasting/film/video: Never   Marital Status: Divorced    Tobacco Counseling Counseling given: Not Answered   Clinical Intake:  Pre-visit preparation completed: Yes  Pain : No/denies pain     Nutritional Risks: None Diabetes: No  How often do you need to have someone help you when you read instructions, pamphlets, or other written materials from your doctor or pharmacy?: 1 - Never    Interpreter Needed?: No  Information entered by :: Melinda Marker LPN   Activities of Daily Living    08/19/2021   10:10 AM 03/11/2021   10:09 AM  In your present state of health, do you have any difficulty performing the following activities:  Hearing? 0 0  Vision? 0 0  Difficulty concentrating or making decisions? 0 0  Walking or climbing stairs? 0 0  Dressing or bathing? 0 0  Doing errands, shopping? 0 0  Preparing Food and eating ? N   Using the Toilet? N   In the past six months, have you accidently leaked urine? Y   Do you have problems with loss of bowel control? N   Managing your Medications? N   Managing your Finances? N   Housekeeping or managing your Housekeeping? N     Patient Care Team: Glean Hess, MD as PCP - General (Internal Medicine) Isaias Cowman, MD as Consulting Physician (Cardiology) Dasher, Rayvon Char, MD as Consulting Physician (Dermatology) [facility] (Pain Medicine)  Indicate any recent Medical Services you may have received from other than [facility] providers in the past year (date may be approximate).     Assessment:   This is a routine wellness examination for Bilan.  Hearing/Vision screen Hearing Screening - Comments:: Pt denies hearing difficulty Vision Screening - Comments:: Annual vision screenings at [facility] Dr. Clydene Laming  Dietary issues and exercise activities discussed: Current Exercise Habits: Home exercise routine, Type of exercise: walking, Time (Minutes): 40, Frequency (Times/Week): 5, Weekly Exercise  (Minutes/Week): 200, Intensity: Moderate, Exercise limited by: None identified   Goals Addressed             This Visit's Progress    DIET - INCREASE WATER INTAKE   On track    Recommend to drink at least 6-8 8oz glasses of water per day.        Depression Screen    08/19/2021   10:08 AM 03/11/2021   10:08 AM 08/28/2020    8:27 AM 08/15/2020   10:17 AM 02/08/2020   10:09 AM 08/24/2019    8:04 AM 08/10/2019   10:08 AM  PHQ 2/9 Scores  PHQ - 2 Score 0 0 0 0 0 0 0  PHQ- 9 Score  0 0  0 0     Fall Risk    08/19/2021   10:10 AM 03/11/2021   10:09 AM 08/28/2020    8:28 AM 08/15/2020   10:19 AM 02/08/2020   10:08 AM  Fall Risk   Falls in the past year? 0 0 1 1 0  Number falls in past yr: 0 0 0 0 0  Injury with Fall? 0 0 0 0 0  Risk for fall due to : No Fall Risks No Fall Risks  No Fall Risks No Fall Risks  Follow up Falls prevention discussed Falls evaluation completed Falls evaluation completed Falls prevention discussed Falls evaluation completed    FALL RISK PREVENTION PERTAINING TO THE HOME:  Any stairs in or around the home? Yes  If so, are there any without handrails? No  Home free of loose throw rugs in walkways, pet beds, electrical cords, etc? Yes  Adequate lighting in your home to reduce risk of falls? Yes   ASSISTIVE DEVICES UTILIZED TO PREVENT FALLS:  Life alert? No  Use of a cane, walker or w/c? No  Grab bars in the bathroom? Yes  Shower chair or bench in shower? Yes  Elevated toilet seat or a handicapped toilet? No   TIMED UP AND GO:  Was the test performed? No . Telephonic visit.   Cognitive Function: Normal cognitive status assessed by direct observation by this Nurse Health Advisor. No abnormalities found.          08/09/2018   10:17 AM 08/05/2017   10:14 AM 07/23/2016    9:33 AM  6CIT Screen  What Year? 0 points 0 points 0 points  What month? 0 points 0 points 0 points  What time? 0 points 0 points 0 points  Count back from 20 0 points 0  points 0 points  Months in reverse 0 points 0 points 0 points  Repeat phrase 0 points 0 points 0 points  Total Score 0 points 0 points 0 points    Immunizations Immunization History  Administered Date(s) Administered   Fluad Quad(high Dose 65+) 12/16/2018   Influenza Split 01/29/2009, 03/31/2009, 01/07/2011   Influenza, High Dose Seasonal PF 01/06/2017   Influenza-Unspecified 01/13/2016, 12/20/2017, 12/16/2019, 01/29/2021   PFIZER Comirnaty(Gray Top)Covid-19 Tri-Sucrose Vaccine 04/20/2019   PFIZER(Purple Top)SARS-COV-2 Vaccination 04/20/2019, 05/14/2019, 12/24/2019, 07/09/2020   Pfizer Covid-19 Vaccine Bivalent Booster 10yr & up 12/15/2020   Pneumococcal Conjugate-13 11/16/2013   Pneumococcal Polysaccharide-23 07/29/2009, 04/01/2010   Tdap 04/01/2010, 01/07/2011, 08/28/2020   Zoster Recombinat (Shingrix) 07/24/2016, 11/24/2016   Zoster, Live 04/01/2010    TDAP status: Up to date  Flu Vaccine status: Up to date  Pneumococcal vaccine status: Up to date  Covid-19 vaccine status: Completed vaccines  Qualifies for Shingles Vaccine? Yes   Zostavax completed Yes   Shingrix Completed?: Yes  Screening Tests Health Maintenance  Topic Date Due   MAMMOGRAM  09/20/2021   INFLUENZA VACCINE  10/29/2021   TETANUS/TDAP  08/29/2030   Pneumonia Vaccine 76 Years old  Completed   DEXA SCAN  Completed   COVID-19 Vaccine  Completed   Zoster Vaccines- Shingrix  Completed   Hepatitis C Screening  Addressed   HPV VACCINES  Aged Out   COLONOSCOPY (Pts 45-41yrInsurance coverage will need to be confirmed)  Discontinued    Health Maintenance  There are no preventive care reminders to display for this patient.  Colorectal cancer screening: No longer required.   Mammogram status: Completed 09/20/20. Repeat every year. Scheduled for 09/23/21.   Bone Density status: Completed 09/19/19. Results reflect: Bone density results: OSTEOPENIA. Repeat every 2 or as directed years.  Lung Cancer  Screening: (Low Dose CT Chest recommended if Age 420-80ears, 30 pack-year currently smoking OR have quit w/in 15years.) does qualify. Completed 12/06/20  Additional Screening:  Hepatitis C Screening: does qualify; Completed 2015  Vision Screening: Recommended annual ophthalmology exams for early detection of glaucoma and other disorders of the eye. Is the patient up to date with their annual eye exam?  Yes  Who is the provider or what is the name of the office in which the patient attends annual eye exams? Dr. WoClydene Laming  Dental Screening: Recommended annual dental exams for proper oral hygiene  Community Resource Referral / Chronic Care Management: CRR required this visit?  No   CCM required this visit?  No      Plan:     I have personally reviewed and noted the following in the patient's chart:   Medical and social history Use of alcohol, tobacco or illicit drugs  Current medications and supplements including opioid prescriptions.  Functional ability and status Nutritional status Physical activity Advanced directives List of other physicians Hospitalizations, surgeries, and ER visits in previous 12 months Vitals Screenings to include cognitive, depression, and falls Referrals and appointments  In addition, I have reviewed and discussed with patient certain preventive protocols, quality metrics, and best practice recommendations. A written personalized care plan for preventive services as well as general preventive health recommendations were provided to patient.     KaClemetine MarkerLPN   08/05/54/4332 Nurse Notes: none

## 2021-08-19 NOTE — Patient Instructions (Signed)
[name] , Thank you for taking time to come for your Medicare Wellness Visit. I appreciate your ongoing commitment to your health goals. Please review the following plan we discussed and let me know if I can assist you in the future.   Screening recommendations/referrals: Colonoscopy: no longer required Mammogram: done 09/20/20. Scheduled for 09/23/21 Bone Density: done 09/19/19 Recommended yearly ophthalmology/optometry visit for glaucoma screening and checkup Recommended yearly dental visit for hygiene and checkup  Vaccinations: Influenza vaccine: done 01/29/21 Pneumococcal vaccine: done 11/16/13 Tdap vaccine: done 08/28/20 Shingles vaccine: done 07/24/16 & 11/24/16   Covid-19:done 04/20/19, 05/14/19, 12/24/19, 07/09/20 & 12/15/20  Conditions/risks identified: Keep up the great work!  Next appointment: Follow up in one year for your annual wellness visit    Preventive Care 65 Years and Older, Female Preventive care refers to lifestyle choices and visits with your health care provider that can promote health and wellness. What does preventive care include? A yearly physical exam. This is also called an annual well check. Dental exams once or twice a year. Routine eye exams. Ask your health care provider how often you should have your eyes checked. Personal lifestyle choices, including: Daily care of your teeth and gums. Regular physical activity. Eating a healthy diet. Avoiding tobacco and drug use. Limiting alcohol use. Practicing safe sex. Taking low-dose aspirin every day. Taking vitamin and mineral supplements as recommended by your health care provider. What happens during an annual well check? The services and screenings done by your health care provider during your annual well check will depend on your age, overall health, lifestyle risk factors, and family history of disease. Counseling  Your health care provider may ask you questions about your: Alcohol use. Tobacco  use. Drug use. Emotional well-being. Home and relationship well-being. Sexual activity. Eating habits. History of falls. Memory and ability to understand (cognition). Work and work Statistician. Reproductive health. Screening  You may have the following tests or measurements: Height, weight, and BMI. Blood pressure. Lipid and cholesterol levels. These may be checked every 5 years, or more frequently if you are over 83 years old. Skin check. Lung cancer screening. You may have this screening every year starting at age 70 if you have a 30-pack-year history of smoking and currently smoke or have quit within the past 15 years. Fecal occult blood test (FOBT) of the stool. You may have this test every year starting at age 45. Flexible sigmoidoscopy or colonoscopy. You may have a sigmoidoscopy every 5 years or a colonoscopy every 10 years starting at age 77. Hepatitis C blood test. Hepatitis B blood test. Sexually transmitted disease (STD) testing. Diabetes screening. This is done by checking your blood sugar (glucose) after you have not eaten for a while (fasting). You may have this done every 1-3 years. Bone density scan. This is done to screen for osteoporosis. You may have this done starting at age 30. Mammogram. This may be done every 1-2 years. Talk to your health care provider about how often you should have regular mammograms. Talk with your health care provider about your test results, treatment options, and if necessary, the need for more tests. Vaccines  Your health care provider may recommend certain vaccines, such as: Influenza vaccine. This is recommended every year. Tetanus, diphtheria, and acellular pertussis (Tdap, Td) vaccine. You may need a Td booster every 10 years. Zoster vaccine. You may need this after age 45. Pneumococcal 13-valent conjugate (PCV13) vaccine. One dose is recommended after age 53. Pneumococcal polysaccharide (PPSV23) vaccine.  One dose is recommended  after age 62. Talk to your health care provider about which screenings and vaccines you need and how often you need them. This information is not intended to replace advice given to you by your health care provider. Make sure you discuss any questions you have with your health care provider. Document Released: 04/13/2015 Document Revised: 12/05/2015 Document Reviewed: 01/16/2015 Elsevier Interactive Patient Education  2017 [place] Prevention in the Home Falls can cause injuries. They can happen to people of all ages. There are many things you can do to make your home safe and to help prevent falls. What can I do on the outside of my home? Regularly fix the edges of walkways and driveways and fix any cracks. Remove anything that might make you trip as you walk through a door, such as a raised step or threshold. Trim any bushes or trees on the path to your home. Use bright outdoor lighting. Clear any walking paths of anything that might make someone trip, such as rocks or tools. Regularly check to see if handrails are loose or broken. Make sure that both sides of any steps have handrails. Any raised decks and porches should have guardrails on the edges. Have any leaves, snow, or ice cleared regularly. Use sand or salt on walking paths during winter. Clean up any spills in your garage right away. This includes oil or grease spills. What can I do in the bathroom? Use night lights. Install grab bars by the toilet and in the tub and shower. Do not use towel bars as grab bars. Use non-skid mats or decals in the tub or shower. If you need to sit down in the shower, use a plastic, non-slip stool. Keep the floor dry. Clean up any water that spills on the floor as soon as it happens. Remove soap buildup in the tub or shower regularly. Attach bath mats securely with double-sided non-slip rug tape. Do not have throw rugs and other things on the floor that can make you trip. What can I do  in the bedroom? Use night lights. Make sure that you have a light by your bed that is easy to reach. Do not use any sheets or blankets that are too big for your bed. They should not hang down onto the floor. Have a firm chair that has side arms. You can use this for support while you get dressed. Do not have throw rugs and other things on the floor that can make you trip. What can I do in the kitchen? Clean up any spills right away. Avoid walking on wet floors. Keep items that you use a lot in easy-to-reach places. If you need to reach something above you, use a strong step stool that has a grab bar. Keep electrical cords out of the way. Do not use floor polish or wax that makes floors slippery. If you must use wax, use non-skid floor wax. Do not have throw rugs and other things on the floor that can make you trip. What can I do with my stairs? Do not leave any items on the stairs. Make sure that there are handrails on both sides of the stairs and use them. Fix handrails that are broken or loose. Make sure that handrails are as long as the stairways. Check any carpeting to make sure that it is firmly attached to the stairs. Fix any carpet that is loose or worn. Avoid having throw rugs at the top or bottom of  the stairs. If you do have throw rugs, attach them to the floor with carpet tape. Make sure that you have a light switch at the top of the stairs and the bottom of the stairs. If you do not have them, ask someone to add them for you. What else can I do to help prevent falls? Wear shoes that: Do not have high heels. Have rubber bottoms. Are comfortable and fit you well. Are closed at the toe. Do not wear sandals. If you use a stepladder: Make sure that it is fully opened. Do not climb a closed stepladder. Make sure that both sides of the stepladder are locked into place. Ask someone to hold it for you, if possible. Clearly mark and make sure that you can see: Any grab bars or  handrails. First and last steps. Where the edge of each step is. Use tools that help you move around (mobility aids) if they are needed. These include: Canes. Walkers. Scooters. Crutches. Turn on the lights when you go into a dark area. Replace any light bulbs as soon as they burn out. Set up your furniture so you have a clear path. Avoid moving your furniture around. If any of your floors are uneven, fix them. If there are any pets around you, be aware of where they are. Review your medicines with your doctor. Some medicines can make you feel dizzy. This can increase your chance of falling. Ask your doctor what other things that you can do to help prevent falls. This information is not intended to replace advice given to you by your health care provider. Make sure you discuss any questions you have with your health care provider. Document Released: 01/11/2009 Document Revised: 08/23/2015 Document Reviewed: 04/21/2014 Elsevier Interactive Patient Education  2017 [employer].

## 2021-08-31 ENCOUNTER — Other Ambulatory Visit: Payer: Self-pay | Admitting: Internal Medicine

## 2021-08-31 DIAGNOSIS — E782 Mixed hyperlipidemia: Secondary | ICD-10-CM

## 2021-09-02 ENCOUNTER — Ambulatory Visit: Payer: Medicare Other

## 2021-09-02 ENCOUNTER — Ambulatory Visit (INDEPENDENT_AMBULATORY_CARE_PROVIDER_SITE_OTHER): Payer: Medicare Other | Admitting: Internal Medicine

## 2021-09-02 ENCOUNTER — Encounter: Payer: Self-pay | Admitting: Internal Medicine

## 2021-09-02 VITALS — BP 128/66 | HR 74 | Ht 66.0 in | Wt 173.0 lb

## 2021-09-02 DIAGNOSIS — J432 Centrilobular emphysema: Secondary | ICD-10-CM

## 2021-09-02 DIAGNOSIS — R7303 Prediabetes: Secondary | ICD-10-CM

## 2021-09-02 DIAGNOSIS — I10 Essential (primary) hypertension: Secondary | ICD-10-CM

## 2021-09-02 DIAGNOSIS — I700 Atherosclerosis of aorta: Secondary | ICD-10-CM | POA: Diagnosis not present

## 2021-09-02 DIAGNOSIS — E782 Mixed hyperlipidemia: Secondary | ICD-10-CM

## 2021-09-02 DIAGNOSIS — I2510 Atherosclerotic heart disease of native coronary artery without angina pectoris: Secondary | ICD-10-CM | POA: Diagnosis not present

## 2021-09-02 DIAGNOSIS — Z0000 Encounter for general adult medical examination without abnormal findings: Secondary | ICD-10-CM

## 2021-09-02 LAB — POCT URINALYSIS DIPSTICK
Bilirubin, UA: NEGATIVE
Glucose, UA: NEGATIVE
Ketones, UA: NEGATIVE
Leukocytes, UA: NEGATIVE
Nitrite, UA: 0.2
Protein, UA: POSITIVE — AB
Spec Grav, UA: 1.01 (ref 1.010–1.025)
Urobilinogen, UA: 0.2 E.U./dL
pH, UA: 7 (ref 5.0–8.0)

## 2021-09-02 NOTE — Telephone Encounter (Signed)
Requested Prescriptions  Pending Prescriptions Disp Refills  . atorvastatin (LIPITOR) 10 MG tablet [Pharmacy Med Name: ATORVASTATIN '10MG'$  TABLETS] 90 tablet 3    Sig: TAKE 1 TABLET(10 MG) BY MOUTH DAILY AT 6 PM     Cardiovascular:  Antilipid - Statins Failed - 08/31/2021  6:20 AM      Failed - Lipid Panel in normal range within the last 12 months    Cholesterol, Total  Date Value Ref Range Status  08/28/2020 137 100 - 199 mg/dL Final   LDL Chol Calc (NIH)  Date Value Ref Range Status  08/28/2020 68 0 - 99 mg/dL Final   HDL  Date Value Ref Range Status  08/28/2020 49 >39 mg/dL Final   Triglycerides  Date Value Ref Range Status  08/28/2020 108 0 - 149 mg/dL Final         Passed - Patient is not pregnant      Passed - Valid encounter within last 12 months    Recent Outpatient Visits          Today Annual physical exam   [facility] Glean Hess, MD   5 months ago Benign hypertension   [facility] Glean Hess, MD   1 year ago Annual physical exam   [facility] Glean Hess, MD   1 year ago Benign hypertension   [facility] Glean Hess, MD   2 years ago Benign hypertension   [facility] Glean Hess, MD      Future Appointments            In 6 months [facility] Melia Jesse Sans, MD [facility], [facility]

## 2021-09-02 NOTE — Progress Notes (Signed)
Date:  09/02/2021   Name:  Melinda Perry [facility]   DOB:  Aug 21, 1945   MRN:  [id number]   Chief Complaint: Annual Exam Melinda Perry is a 76 y.o. female who presents today for her Complete Annual Exam. She feels well. She reports walking 5x weekly. She reports she is sleeping fairly well. Breast complaints - none.  Mammogram: scheduled 6/26 DEXA: 08/2019 osteopenia Pap smear: discontinued Colonoscopy: 2014  There are no preventive care reminders to display for this patient.  Immunization History  Administered Date(s) Administered   Fluad Quad(high Dose 65+) 12/16/2018   Influenza Split 01/29/2009, 03/31/2009, 01/07/2011   Influenza, High Dose Seasonal PF 01/06/2017   Influenza-Unspecified 01/13/2016, 12/20/2017, 12/16/2019, 01/29/2021   PFIZER Comirnaty(Gray Top)Covid-19 Tri-Sucrose Vaccine 04/20/2019   PFIZER(Purple Top)SARS-COV-2 Vaccination 04/20/2019, 05/14/2019, 12/24/2019, 07/09/2020, 07/31/2021   Pfizer Covid-19 Vaccine Bivalent Booster 69yr & up 12/15/2020   Pneumococcal Conjugate-13 11/16/2013   Pneumococcal Polysaccharide-23 07/29/2009, 04/01/2010   Tdap 04/01/2010, 01/07/2011, 08/28/2020   Zoster Recombinat (Shingrix) 07/24/2016, 11/24/2016   Zoster, Live 04/01/2010    Hypertension This is a chronic problem. The problem is controlled. Pertinent negatives include no chest pain, headaches, palpitations or shortness of breath. Past treatments include angiotensin blockers and beta blockers.  Hyperlipidemia This is a chronic problem. The problem is controlled. Recent lipid tests were reviewed and are normal. Pertinent negatives include no chest pain or shortness of breath. Current antihyperlipidemic treatment includes statins. The current treatment provides significant improvement of lipids.   Lab Results  Component Value Date   NA 137 03/11/2021   K 4.9 03/11/2021   CO2 21 03/11/2021   GLUCOSE 96 03/11/2021   BUN 16 03/11/2021   CREATININE 1.00 03/11/2021    CALCIUM 10.1 03/11/2021   EGFR 58 (L) 03/11/2021   GFRNONAA 65 02/08/2020   Lab Results  Component Value Date   CHOL 137 08/28/2020   HDL 49 08/28/2020   LDLCALC 68 08/28/2020   TRIG 108 08/28/2020   CHOLHDL 2.8 08/28/2020   Lab Results  Component Value Date   TSH 2.720 08/28/2020   Lab Results  Component Value Date   HGBA1C 5.1 03/11/2021   Lab Results  Component Value Date   WBC 6.1 08/28/2020   HGB 14.9 08/28/2020   HCT 45.2 08/28/2020   MCV 89 08/28/2020   PLT 210 08/28/2020   Lab Results  Component Value Date   ALT 18 08/28/2020   AST 20 08/28/2020   ALKPHOS 77 08/28/2020   BILITOT 0.7 08/28/2020   Lab Results  Component Value Date   VD25OH 36.4 08/28/2020     Review of Systems  Constitutional:  Negative for chills, fatigue and fever.  HENT:  Negative for congestion, hearing loss, tinnitus, trouble swallowing and voice change.   Eyes:  Negative for visual disturbance.  Respiratory:  Negative for cough, chest tightness, shortness of breath and wheezing.   Cardiovascular:  Negative for chest pain, palpitations and leg swelling.  Gastrointestinal:  Negative for abdominal pain, constipation, diarrhea and vomiting.  Endocrine: Negative for polydipsia and polyuria.  Genitourinary:  Negative for dysuria, frequency, genital sores, vaginal bleeding and vaginal discharge.  Musculoskeletal:  Negative for arthralgias, gait problem and joint swelling.  Skin:  Negative for color change and rash.  Neurological:  Negative for dizziness, tremors, light-headedness and headaches.  Hematological:  Negative for adenopathy. Does not bruise/bleed easily.  Psychiatric/Behavioral:  Negative for dysphoric mood and sleep disturbance. The patient is not nervous/anxious.    Patient Active Problem  List   Diagnosis Date Noted   Atherosclerosis of native coronary artery of native heart without angina pectoris 03/11/2021   Prediabetes 02/09/2020   Centrilobular emphysema ([place])  10/27/2019   Aortic atherosclerosis ([place]) 03/08/2019   Osteopenia determined by x-ray 08/13/2017   Alopecia areata 07/23/2016   Heart palpitations 07/23/2016   Xerosis cutis 04/11/2015   Mixed hyperlipidemia 03/09/2015   Tobacco use disorder, moderate, in sustained remission 03/09/2015   History of vertebral compression fracture 03/09/2015   History of colon polyps 03/09/2015   Benign hypertension 03/09/2015   Microscopic hematuria 03/09/2015    Allergies  Allergen Reactions   Bupropion Other (See Comments)    Hair loss    Past Surgical History:  Procedure Laterality Date   AUGMENTATION MAMMAPLASTY Bilateral 1988   BREAST ENHANCEMENT SURGERY     BUNIONECTOMY Bilateral    CERVICAL CONE BIOPSY  1971   LSIL   EYE SURGERY  Lens replacement   facet shots in back  05/2016   [facility]-    TUBAL LIGATION      Social History   Tobacco Use   Smoking status: Former    Packs/day: 0.75    Years: 60.00    Pack years: 45.00    Types: Cigarettes    Quit date: 12/20/2017    Years since quitting: 3.7   Smokeless tobacco: Never  Vaping Use   Vaping Use: Never used  Substance Use Topics   Alcohol use: Yes    Comment: 2 drinks or less/month   Drug use: No     Medication list has been reviewed and updated.  Current Meds  Medication Sig   aspirin 81 MG EC tablet Take 1 tablet (81 mg total) by mouth daily. Swallow whole.   atorvastatin (LIPITOR) 10 MG tablet TAKE 1 TABLET(10 MG) BY MOUTH DAILY AT 6 PM   Calcium Carbonate+Vitamin D 600-200 MG-UNIT TABS Take 1 tablet by mouth daily.   Fluocinolone Acetonide 0.01 % OIL fluocinolone acetonide oil 0.01 % ear drops  APPLY TO BOTH EARS TWICE DAILY AS NEEDED   hydrocortisone 2.5 % cream as needed.   losartan (COZAAR) 50 MG tablet TAKE 1 TABLET BY MOUTH DAILY   metoprolol succinate (TOPROL-XL) 25 MG 24 hr tablet Take 25 mg by mouth daily. Take 1/2 tablet daily   Multiple Vitamins-Minerals (MULTIVITAMIN WOMENS 50+ ADV PO) Take by  mouth.   RESTASIS MULTIDOSE 0.05 % ophthalmic emulsion 1 drop 2 (two) times daily.   triamcinolone cream (KENALOG) 0.1 % triamcinolone acetonide 0.1 % topical cream       09/02/2021    8:44 AM 03/11/2021   10:09 AM 08/28/2020    8:27 AM 02/08/2020   10:09 AM  GAD 7 : Generalized Anxiety Score  Nervous, Anxious, on Edge 0 0 0 0  Control/stop worrying 0 0 0 0  Worry too much - different things 0 0 0 0  Trouble relaxing 0 0 0 0  Restless 0 0 0 0  Easily annoyed or irritable 0 0 0 0  Afraid - awful might happen 0 0 0 0  Total GAD 7 Score 0 0 0 0  Anxiety Difficulty Not difficult at all Not difficult at all  Not difficult at all       09/02/2021    8:43 AM  Depression screen PHQ 2/9  Decreased Interest 0  Down, Depressed, Hopeless 0  PHQ - 2 Score 0  Altered sleeping 1  Tired, decreased energy 0  Change in appetite 0  Feeling bad or failure about yourself  0  Trouble concentrating 0  Moving slowly or fidgety/restless 0  Suicidal thoughts 0  PHQ-9 Score 1  Difficult doing work/chores Not difficult at all    BP Readings from Last 3 Encounters:  09/02/21 128/66  03/11/21 136/78  08/28/20 116/74    Physical Exam Vitals and nursing note reviewed.  Constitutional:      General: She is not in acute distress.    Appearance: She is well-developed.  HENT:     Head: Normocephalic and atraumatic.     Right Ear: Tympanic membrane and ear canal normal.     Left Ear: Tympanic membrane and ear canal normal.     Nose:     Right Sinus: No maxillary sinus tenderness.     Left Sinus: No maxillary sinus tenderness.  Eyes:     General: No scleral icterus.       Right eye: No discharge.        Left eye: No discharge.     Conjunctiva/sclera: Conjunctivae normal.  Neck:     Thyroid: No thyromegaly.     Vascular: No carotid bruit.  Cardiovascular:     Rate and Rhythm: Normal rate and regular rhythm.     Pulses: Normal pulses.     Heart sounds: Normal heart sounds.  Pulmonary:      Effort: Pulmonary effort is normal. No respiratory distress.     Breath sounds: No wheezing.  Chest:  Breasts:    Right: No mass, nipple discharge, skin change or tenderness.     Left: No mass, nipple discharge, skin change or tenderness.  Abdominal:     General: Bowel sounds are normal.     Palpations: Abdomen is soft.     Tenderness: There is no abdominal tenderness.  Musculoskeletal:     Cervical back: Normal range of motion. No erythema.     Right lower leg: No edema.     Left lower leg: No edema.  Lymphadenopathy:     Cervical: No cervical adenopathy.  Skin:    General: Skin is warm and dry.     Findings: No rash.  Neurological:     Mental Status: She is alert and oriented to person, place, and time.     Cranial Nerves: No cranial nerve deficit.     Sensory: No sensory deficit.     Deep Tendon Reflexes: Reflexes are normal and symmetric.  Psychiatric:        Attention and Perception: Attention normal.        Mood and Affect: Mood normal.    Wt Readings from Last 3 Encounters:  09/02/21 173 lb (78.5 kg)  03/11/21 177 lb (80.3 kg)  08/28/20 177 lb (80.3 kg)    BP 128/66   Pulse 74   Ht '5\' 6"'  (1.676 m)   Wt 173 lb (78.5 kg)   SpO2 98%   BMI 27.92 kg/m   Assessment and Plan: 1. Annual physical exam Normal exam Continue healthy diet and regular exercise Up to date on screenings and immunizations.   2. Benign hypertension Clinically stable exam with well controlled BP. Tolerating medications without side effects at this time. Pt to continue current regimen and low sodium diet; benefits of regular exercise as able discussed. - CBC with Differential/Platelet - Comprehensive metabolic panel - TSH - POCT urinalysis dipstick - dip + for leuks - micro unremarkable  3. Mixed hyperlipidemia On statin therapy without side effects - Lipid panel  4. Prediabetes Continue  diet and exercise measures - Comprehensive metabolic panel - Hemoglobin A1c  5.  Centrilobular emphysema (HCC) Remains tobacco free with no pulmonary complaints Participating in the LDCT screening program - if she has aged out would still consider CT to follow the LUL nodule  6. Aortic atherosclerosis (HCC) On statin and aspirin  7. Atherosclerosis of native coronary artery of native heart without angina pectoris Followed by Cardiology Recent stress ECHO was normal.   Partially dictated using Editor, commissioning. Any errors are unintentional.  Halina Maidens, MD [facility]  09/02/2021

## 2021-09-03 LAB — CBC WITH DIFFERENTIAL/PLATELET
Basophils Absolute: 0 10*3/uL (ref 0.0–0.2)
Basos: 1 %
EOS (ABSOLUTE): 0.1 10*3/uL (ref 0.0–0.4)
Eos: 1 %
Hematocrit: 43.9 % (ref 34.0–46.6)
Hemoglobin: 14.6 g/dL (ref 11.1–15.9)
Immature Grans (Abs): 0 10*3/uL (ref 0.0–0.1)
Immature Granulocytes: 0 %
Lymphocytes Absolute: 1.6 10*3/uL (ref 0.7–3.1)
Lymphs: 29 %
MCH: 30.1 pg (ref 26.6–33.0)
MCHC: 33.3 g/dL (ref 31.5–35.7)
MCV: 91 fL (ref 79–97)
Monocytes Absolute: 0.4 10*3/uL (ref 0.1–0.9)
Monocytes: 7 %
Neutrophils Absolute: 3.5 10*3/uL (ref 1.4–7.0)
Neutrophils: 62 %
Platelets: 207 10*3/uL (ref 150–450)
RBC: 4.85 x10E6/uL (ref 3.77–5.28)
RDW: 12.8 % (ref 11.7–15.4)
WBC: 5.7 10*3/uL (ref 3.4–10.8)

## 2021-09-03 LAB — LIPID PANEL
Chol/HDL Ratio: 2.9 ratio (ref 0.0–4.4)
Cholesterol, Total: 155 mg/dL (ref 100–199)
HDL: 54 mg/dL (ref >39)
LDL Chol Calc (NIH): 81 mg/dL (ref 0–99)
Triglycerides: 114 mg/dL (ref 0–149)
VLDL Cholesterol Cal: 20 mg/dL (ref 5–40)

## 2021-09-03 LAB — COMPREHENSIVE METABOLIC PANEL
ALT: 22 IU/L (ref 0–32)
AST: 21 IU/L (ref 0–40)
Albumin/Globulin Ratio: 1.7 (ref 1.2–2.2)
Albumin: 4.3 g/dL (ref 3.7–4.7)
Alkaline Phosphatase: 70 IU/L (ref 44–121)
BUN/Creatinine Ratio: 14 (ref 12–28)
BUN: 14 mg/dL (ref 8–27)
Bilirubin Total: 0.6 mg/dL (ref 0.0–1.2)
CO2: 23 mmol/L (ref 20–29)
Calcium: 9.4 mg/dL (ref 8.7–10.3)
Chloride: 107 mmol/L — ABNORMAL HIGH (ref 96–106)
Creatinine, Ser: 0.97 mg/dL (ref 0.57–1.00)
Globulin, Total: 2.6 g/dL (ref 1.5–4.5)
Glucose: 111 mg/dL — ABNORMAL HIGH (ref 70–99)
Potassium: 4.2 mmol/L (ref 3.5–5.2)
Sodium: 142 mmol/L (ref 134–144)
Total Protein: 6.9 g/dL (ref 6.0–8.5)
eGFR: 60 mL/min/{1.73_m2} (ref >59)

## 2021-09-03 LAB — TSH: TSH: 2.3 u[IU]/mL (ref 0.450–4.500)

## 2021-09-03 LAB — HEMOGLOBIN A1C
Est. average glucose Bld gHb Est-mCnc: 111 mg/dL
Hgb A1c MFr Bld: 5.5 % (ref 4.8–5.6)

## 2021-09-15 ENCOUNTER — Other Ambulatory Visit: Payer: Self-pay | Admitting: Internal Medicine

## 2021-09-15 DIAGNOSIS — I10 Essential (primary) hypertension: Secondary | ICD-10-CM

## 2021-09-16 NOTE — Telephone Encounter (Signed)
Refilled 07/26/2021 #90 0 refill. Requested Prescriptions  Pending Prescriptions Disp Refills  . losartan (COZAAR) 50 MG tablet [Pharmacy Med Name: LOSARTAN '50MG'$  TABLETS] 90 tablet 0    Sig: TAKE 1 TABLET BY MOUTH DAILY     Cardiovascular:  Angiotensin Receptor Blockers Passed - 09/15/2021  1:19 PM      Passed - Cr in normal range and within 180 days    Creatinine, Ser  Date Value Ref Range Status  09/02/2021 0.97 0.57 - 1.00 mg/dL Final         Passed - K in normal range and within 180 days    Potassium  Date Value Ref Range Status  09/02/2021 4.2 3.5 - 5.2 mmol/L Final         Passed - Patient is not pregnant      Passed - Last BP in normal range    BP Readings from Last 1 Encounters:  09/02/21 128/66         Passed - Valid encounter within last 6 months    Recent Outpatient Visits          2 weeks ago Annual physical exam   [facility] Glean Hess, MD   6 months ago Benign hypertension   [facility] Glean Hess, MD   1 year ago Annual physical exam   [facility] Glean Hess, MD   1 year ago Benign hypertension   [facility] Glean Hess, MD   2 years ago Benign hypertension   [facility] Glean Hess, MD      Future Appointments            In 5 months Army Melia Jesse Sans, MD [facility], [facility]

## 2021-09-19 ENCOUNTER — Other Ambulatory Visit: Payer: Self-pay

## 2021-09-19 DIAGNOSIS — I10 Essential (primary) hypertension: Secondary | ICD-10-CM

## 2021-09-19 MED ORDER — LOSARTAN POTASSIUM 50 MG PO TABS: 50.0000 mg | ORAL_TABLET | Freq: Every day | ORAL | 1 refills | Status: DC

## 2021-09-23 ENCOUNTER — Ambulatory Visit
Admission: RE | Admit: 2021-09-23 | Discharge: 2021-09-23 | Disposition: A | Discharge status: Home / Self Care | Payer: Medicare Other | Source: Ambulatory Visit | Attending: Internal Medicine | Admitting: Internal Medicine

## 2021-09-23 DIAGNOSIS — Z1231 Encounter for screening mammogram for malignant neoplasm of breast: Secondary | ICD-10-CM | POA: Insufficient documentation

## 2021-09-27 ENCOUNTER — Encounter: Payer: Self-pay | Admitting: Emergency Medicine

## 2021-09-27 ENCOUNTER — Ambulatory Visit (INDEPENDENT_AMBULATORY_CARE_PROVIDER_SITE_OTHER): Payer: Medicare Other

## 2021-09-27 ENCOUNTER — Ambulatory Visit
Admission: EM | Admit: 2021-09-27 | Discharge: 2021-09-27 | Disposition: A | Discharge status: Home / Self Care | Payer: Medicare Other | Attending: Emergency Medicine | Admitting: Emergency Medicine

## 2021-09-27 DIAGNOSIS — S92355A Nondisplaced fracture of fifth metatarsal bone, left foot, initial encounter for closed fracture: Secondary | ICD-10-CM

## 2021-09-27 DIAGNOSIS — S92102A Unspecified fracture of left talus, initial encounter for closed fracture: Secondary | ICD-10-CM

## 2021-09-27 DIAGNOSIS — M7989 Other specified soft tissue disorders: Secondary | ICD-10-CM | POA: Diagnosis not present

## 2021-09-27 NOTE — ED Triage Notes (Signed)
Patient states that she rolled her left ankle and fell in her garage around 5 pm today.  Patient c/o left ankle and foot swelling and pain.  Patient has an abrasion to her left elbow.  Patient denies hitting her head.

## 2021-09-27 NOTE — ED Provider Notes (Signed)
[facility]    CSN: [id number] Arrival date & time: 09/27/21  1850      History   Chief Complaint Chief Complaint  Patient presents with   Fall   Foot Pain    HPI Melinda Perry is a 76 y.o. female.   Patient presents with left ankle pain and swelling beginning today after fall.  Endorses that she was standing on a step reaching to get object when she slipped causing her ankle to rotate externally.  Painful to bear weight.  Range of motion is intact but elicits pain with all movements.  Has attempted use of ice which has been minimally helpful.  Denies numbness, tingling, prior injury or trauma.  Past Medical History:  Diagnosis Date   Ankle pain 04/11/2015   Cancer ([place])    skin ca -squamous cell on face   Centrilobular emphysema ([place]) 10/27/2019   High cholesterol    Hypertension    PVC (premature ventricular contraction)     Patient Active Problem List   Diagnosis Date Noted   Atherosclerosis of native coronary artery of native heart without angina pectoris 03/11/2021   Prediabetes 02/09/2020   Centrilobular emphysema ([place]) 10/27/2019   Aortic atherosclerosis ([place]) 03/08/2019   Osteopenia determined by x-ray 08/13/2017   Alopecia areata 07/23/2016   Heart palpitations 07/23/2016   Xerosis cutis 04/11/2015   Mixed hyperlipidemia 03/09/2015   Tobacco use disorder, moderate, in sustained remission 03/09/2015   History of vertebral compression fracture 03/09/2015   History of colon polyps 03/09/2015   Benign hypertension 03/09/2015   Microscopic hematuria 03/09/2015    Past Surgical History:  Procedure Laterality Date   AUGMENTATION MAMMAPLASTY Bilateral 1988   BREAST ENHANCEMENT SURGERY     BUNIONECTOMY Bilateral    CERVICAL CONE BIOPSY  1971   LSIL   EYE SURGERY  Lens replacement   facet shots in back  05/2016   [facility]-    TUBAL LIGATION      OB History   No obstetric history on file.      Home Medications    Prior to  Admission medications   Medication Sig Start Date End Date Taking? Authorizing Provider  aspirin 81 MG EC tablet Take 1 tablet (81 mg total) by mouth daily. Swallow whole. 03/11/21   Glean Hess, MD  atorvastatin (LIPITOR) 10 MG tablet TAKE 1 TABLET(10 MG) BY MOUTH DAILY AT 6 PM 09/02/21   Glean Hess, MD  Calcium Carbonate+Vitamin D 600-200 MG-UNIT TABS Take 1 tablet by mouth daily.    [provider]  Fluocinolone Acetonide 0.01 % OIL fluocinolone acetonide oil 0.01 % ear drops  APPLY TO BOTH EARS TWICE DAILY AS NEEDED    [provider]  hydrocortisone 2.5 % cream as needed.    [provider]  losartan (COZAAR) 50 MG tablet Take 1 tablet (50 mg total) by mouth daily. 09/19/21   Glean Hess, MD  metoprolol succinate (TOPROL-XL) 25 MG 24 hr tablet Take 25 mg by mouth daily. Take 1/2 tablet daily 05/13/17   [provider]  Multiple Vitamins-Minerals (MULTIVITAMIN WOMENS 50+ ADV PO) Take by mouth.    [provider]  RESTASIS MULTIDOSE 0.05 % ophthalmic emulsion 1 drop 2 (two) times daily. 08/16/21   [provider]  triamcinolone cream (KENALOG) 0.1 % triamcinolone acetonide 0.1 % topical cream 09/14/17   [provider]  omeprazole (PRILOSEC) 10 MG capsule Take 10 mg by mouth daily. TEMPERARY- OTC for Keto Diet  05/30/19  [provider]    Family History Family History  Problem Relation Age of Onset   Lung cancer Mother    Alcohol abuse Mother    Cancer Mother    Pulmonary fibrosis Father    Alcohol abuse Sister    Breast cancer Neg Hx     Social History Social History   Tobacco Use   Smoking status: Former    Packs/day: 0.75    Years: 60.00    Total pack years: 45.00    Types: Cigarettes    Quit date: 12/20/2017    Years since quitting: 3.7   Smokeless tobacco: Never  Vaping Use   Vaping Use: Never used  Substance Use Topics   Alcohol use: Yes    Comment: 2 drinks or less/month   Drug  use: No     Allergies   Bupropion   Review of Systems Review of Systems  Constitutional: Negative.   Respiratory: Negative.    Cardiovascular: Negative.   Musculoskeletal:  Positive for gait problem and joint swelling. Negative for arthralgias, back pain, myalgias, neck pain and neck stiffness.  Skin: Negative.      Physical Exam Triage Vital Signs ED Triage Vitals  Enc Vitals Group     BP 09/27/21 1912 (!) 204/71     Pulse Rate 09/27/21 1912 85     Resp 09/27/21 1912 14     Temp 09/27/21 1912 98.2 F (36.8 C)     Temp Source 09/27/21 1912 Oral     SpO2 09/27/21 1912 98 %     Weight 09/27/21 1910 170 lb (77.1 kg)     Height 09/27/21 1910 '5\' 6"'$  (1.676 m)     Head Circumference --      Peak Flow --      Pain Score 09/27/21 1910 8     Pain Loc --      Pain Edu? --      Excl. in [place]? --    No data found.  Updated Vital Signs BP (!) 204/71 (BP Location: Left Arm)   Pulse 85   Temp 98.2 F (36.8 C) (Oral)   Resp 14   Ht '5\' 6"'$  (1.676 m)   Wt 170 lb (77.1 kg)   SpO2 98%   BMI 27.44 kg/m   Visual Acuity Right Eye Distance:   Left Eye Distance:   Bilateral Distance:    Right Eye Near:   Left Eye Near:    Bilateral Near:     Physical Exam Constitutional:      Appearance: Normal appearance.  HENT:     Head: Normocephalic.  Eyes:     Extraocular Movements: Extraocular movements intact.  Pulmonary:     Effort: Pulmonary effort is normal.  Musculoskeletal:     Comments: Moderate to severe swelling and tenderness noted to the anterior and lateral aspect of the left ankle with ecchymosis present, able to bear weight, range of motion intact, 2+ dorsalis pedis and pedal pulse, sensation intact  Neurological:     Mental Status: She is alert and oriented to person, place, and time. Mental status is at baseline.  Psychiatric:        Mood and Affect: Mood normal.        Behavior: Behavior normal.      UC Treatments / Results  Labs (all labs ordered are  listed, but only abnormal results are displayed) Labs Reviewed - No data to display  EKG   Radiology No results found.  Procedures Procedures (including critical care time)  Medications Ordered in UC Medications - No data to display  Initial Impression / Assessment and Plan / UC Course  I have reviewed the triage vital signs and the nursing notes.  Pertinent labs & imaging results that were available during my care of the patient were reviewed by me and considered in my medical decision making (see chart for details).  Nondisplaced fracture of the fifth metatarsal bone, left foot, initial encounter Closed nondisplaced fracture of the left talus , initial encounter  Confirmed via x-ray, discussed findings with patient, cam boot applied by nursing staff, neurovascularly intact prior to and after placement, recommended Tylenol and ibuprofen for management of discomfort, patient is to wear boot whenever walking or standing, may use ice and heat and elevation for additional measures to reduce swelling, recommended follow-up with orthopedics in 1 week, established at [facility] Final Clinical Impressions(s) / UC Diagnoses   Final diagnoses:  None   Discharge Instructions   None    ED Prescriptions   None    PDMP not reviewed this encounter.   Hans Eden, NP 09/27/21 1954

## 2021-09-27 NOTE — Discharge Instructions (Addendum)
Your x-ray today showed a fracture ( break in bone) of to your foot and ankle  Take ibuprofen 600 mg 3 times daily (every 8 hours) for the next 5 days then you may use as needed, may use Tylenol 325 to 500 mg every 6 hours as needed for additional comfort   You have been placed in a boot. This is used to protect your injury and prevent further damage. Wear whenever you are walking or standing.   Follow up with orthopedic specialist in 1 week. Call practice to make appointment. Information listed below. You may go to any orthopedic provider you deem fit.  Please do not put weight on fracture.

## 2021-09-30 DIAGNOSIS — S92355A Nondisplaced fracture of fifth metatarsal bone, left foot, initial encounter for closed fracture: Secondary | ICD-10-CM | POA: Diagnosis not present

## 2021-10-28 DIAGNOSIS — Z961 Presence of intraocular lens: Secondary | ICD-10-CM | POA: Diagnosis not present

## 2021-10-28 DIAGNOSIS — H04123 Dry eye syndrome of bilateral lacrimal glands: Secondary | ICD-10-CM | POA: Diagnosis not present

## 2021-10-28 DIAGNOSIS — H1031 Unspecified acute conjunctivitis, right eye: Secondary | ICD-10-CM | POA: Diagnosis not present

## 2021-11-01 DIAGNOSIS — S92355A Nondisplaced fracture of fifth metatarsal bone, left foot, initial encounter for closed fracture: Secondary | ICD-10-CM | POA: Diagnosis not present

## 2021-11-13 DIAGNOSIS — R0602 Shortness of breath: Secondary | ICD-10-CM | POA: Diagnosis not present

## 2021-11-13 DIAGNOSIS — I10 Essential (primary) hypertension: Secondary | ICD-10-CM | POA: Diagnosis not present

## 2021-11-13 DIAGNOSIS — I493 Ventricular premature depolarization: Secondary | ICD-10-CM | POA: Diagnosis not present

## 2021-11-13 DIAGNOSIS — R002 Palpitations: Secondary | ICD-10-CM | POA: Diagnosis not present

## 2021-11-13 DIAGNOSIS — R001 Bradycardia, unspecified: Secondary | ICD-10-CM | POA: Diagnosis not present

## 2021-11-26 DIAGNOSIS — H1031 Unspecified acute conjunctivitis, right eye: Secondary | ICD-10-CM | POA: Diagnosis not present

## 2021-11-26 DIAGNOSIS — H04123 Dry eye syndrome of bilateral lacrimal glands: Secondary | ICD-10-CM | POA: Diagnosis not present

## 2021-11-26 DIAGNOSIS — Z961 Presence of intraocular lens: Secondary | ICD-10-CM | POA: Diagnosis not present

## 2021-12-05 ENCOUNTER — Ambulatory Visit
Admission: RE | Admit: 2021-12-05 | Discharge: 2021-12-05 | Disposition: A | Discharge status: Home / Self Care | Payer: Medicare Other | Source: Ambulatory Visit | Attending: Acute Care | Admitting: Acute Care

## 2021-12-05 DIAGNOSIS — Z87891 Personal history of nicotine dependence: Secondary | ICD-10-CM | POA: Insufficient documentation

## 2021-12-09 ENCOUNTER — Other Ambulatory Visit: Payer: Self-pay

## 2021-12-09 DIAGNOSIS — Z122 Encounter for screening for malignant neoplasm of respiratory organs: Secondary | ICD-10-CM

## 2021-12-09 DIAGNOSIS — Z87891 Personal history of nicotine dependence: Secondary | ICD-10-CM

## 2021-12-10 ENCOUNTER — Inpatient Hospital Stay: Admission: RE | Admit: 2021-12-10 | Payer: Medicare Other | Source: Ambulatory Visit

## 2021-12-10 DIAGNOSIS — Z961 Presence of intraocular lens: Secondary | ICD-10-CM | POA: Diagnosis not present

## 2021-12-10 DIAGNOSIS — H1031 Unspecified acute conjunctivitis, right eye: Secondary | ICD-10-CM | POA: Diagnosis not present

## 2021-12-10 DIAGNOSIS — H04123 Dry eye syndrome of bilateral lacrimal glands: Secondary | ICD-10-CM | POA: Diagnosis not present

## 2021-12-11 ENCOUNTER — Telehealth: Payer: Self-pay | Admitting: Internal Medicine

## 2021-12-11 NOTE — Telephone Encounter (Signed)
Spoke to Melinda Perry let her know that at this time Dr. Army Melia is unsure about the RSV vaccine. That it is up to her whether she wants to get the vaccine. Melinda Perry verbalized understanding.  KP

## 2021-12-11 NOTE — Telephone Encounter (Signed)
Copied from [place] [id number]. Topic: General - Inquiry >> Dec 11, 2021 11:27 AM Marcellus Scott wrote: Reason for CRM: Pt stated her cardiologist is unsure (pt stated on the fence) on the RSV vaccine wants to know what Dr.Burglund suggest.  Pt is requesting a call back .

## 2021-12-19 DIAGNOSIS — H40053 Ocular hypertension, bilateral: Secondary | ICD-10-CM | POA: Diagnosis not present

## 2022-01-07 DIAGNOSIS — H04123 Dry eye syndrome of bilateral lacrimal glands: Secondary | ICD-10-CM | POA: Diagnosis not present

## 2022-01-07 DIAGNOSIS — H1031 Unspecified acute conjunctivitis, right eye: Secondary | ICD-10-CM | POA: Diagnosis not present

## 2022-01-07 DIAGNOSIS — H40053 Ocular hypertension, bilateral: Secondary | ICD-10-CM | POA: Diagnosis not present

## 2022-01-07 DIAGNOSIS — Z961 Presence of intraocular lens: Secondary | ICD-10-CM | POA: Diagnosis not present

## 2022-01-08 DIAGNOSIS — S92355A Nondisplaced fracture of fifth metatarsal bone, left foot, initial encounter for closed fracture: Secondary | ICD-10-CM | POA: Diagnosis not present

## 2022-02-24 ENCOUNTER — Other Ambulatory Visit: Payer: Self-pay | Admitting: Internal Medicine

## 2022-02-24 DIAGNOSIS — I10 Essential (primary) hypertension: Secondary | ICD-10-CM

## 2022-02-25 NOTE — Telephone Encounter (Signed)
Too soon last reordered 09/19/21 #90 1 RF  Requested Prescriptions  Refused Prescriptions Disp Refills   losartan (COZAAR) 50 MG tablet [Pharmacy Med Name: LOSARTAN '50MG'$  TABLETS] 90 tablet 1    Sig: TAKE 1 TABLET(50 MG) BY MOUTH DAILY     Cardiovascular:  Angiotensin Receptor Blockers Failed - 02/24/2022  6:24 AM      Failed - Last BP in normal range    BP Readings from Last 1 Encounters:  09/27/21 (!) 204/71         Passed - Cr in normal range and within 180 days    Creatinine, Ser  Date Value Ref Range Status  09/02/2021 0.97 0.57 - 1.00 mg/dL Final         Passed - K in normal range and within 180 days    Potassium  Date Value Ref Range Status  09/02/2021 4.2 3.5 - 5.2 mmol/L Final         Passed - Patient is not pregnant      Passed - Valid encounter within last 6 months    Recent Outpatient Visits           5 months ago Annual physical exam   [place] [facility], Jesse Sans, MD   11 months ago Benign hypertension   [place] [facility] at [facility], Jesse Sans, MD   1 year ago Annual physical exam   [facility] at [facility], Jesse Sans, MD   2 years ago Benign hypertension   [place] [facility] at [facility], Jesse Sans, MD   2 years ago Benign hypertension   [place] [facility] at [facility], Jesse Sans, MD       Future Appointments             In 1 week Army Melia, Jesse Sans, MD [place] [facility] at [facility], [facility]

## 2022-02-27 ENCOUNTER — Other Ambulatory Visit: Payer: Self-pay | Admitting: Internal Medicine

## 2022-02-27 DIAGNOSIS — I10 Essential (primary) hypertension: Secondary | ICD-10-CM

## 2022-02-27 NOTE — Telephone Encounter (Signed)
Call to pharmacy- patient has just picked up recently- she should be good until January. Requested Prescriptions  Pending Prescriptions Disp Refills   losartan (COZAAR) 50 MG tablet [Pharmacy Med Name: LOSARTAN '50MG'$  TABLETS] 90 tablet 1    Sig: TAKE 1 TABLET(50 MG) BY MOUTH DAILY     Cardiovascular:  Angiotensin Receptor Blockers Failed - 02/27/2022  2:00 PM      Failed - Last BP in normal range    BP Readings from Last 1 Encounters:  09/27/21 (!) 204/71         Passed - Cr in normal range and within 180 days    Creatinine, Ser  Date Value Ref Range Status  09/02/2021 0.97 0.57 - 1.00 mg/dL Final         Passed - K in normal range and within 180 days    Potassium  Date Value Ref Range Status  09/02/2021 4.2 3.5 - 5.2 mmol/L Final         Passed - Patient is not pregnant      Passed - Valid encounter within last 6 months    Recent Outpatient Visits           5 months ago Annual physical exam   [place] [facility] at [facility], Jesse Sans, MD   11 months ago Benign hypertension   [place] [facility] at [facility], Jesse Sans, MD   1 year ago Annual physical exam   [facility] at [facility], Jesse Sans, MD   2 years ago Benign hypertension   [place] [facility] at [facility], Jesse Sans, MD   2 years ago Benign hypertension   [place] [facility] at [facility], Jesse Sans, MD       Future Appointments             In 5 days Army Melia Jesse Sans, MD [place] [facility] at [facility], [facility]

## 2022-03-04 ENCOUNTER — Ambulatory Visit (INDEPENDENT_AMBULATORY_CARE_PROVIDER_SITE_OTHER): Payer: Medicare Other | Admitting: Internal Medicine

## 2022-03-04 ENCOUNTER — Encounter: Payer: Self-pay | Admitting: Internal Medicine

## 2022-03-04 VITALS — BP 128/68 | HR 68 | Ht 66.0 in | Wt 171.8 lb

## 2022-03-04 DIAGNOSIS — I10 Essential (primary) hypertension: Secondary | ICD-10-CM

## 2022-03-04 DIAGNOSIS — Z23 Encounter for immunization: Secondary | ICD-10-CM

## 2022-03-04 NOTE — Progress Notes (Signed)
Date:  03/04/2022   Name:  Melinda Perry [facility]   DOB:  1946-01-16   MRN:  [id number]   Chief Complaint: Hypertension  Hypertension This is a chronic problem. The problem is controlled. Pertinent negatives include no chest pain, headaches, palpitations or shortness of breath. Past treatments include beta blockers and angiotensin blockers. The current treatment provides significant improvement. There is no history of kidney disease, CAD/MI or CVA.    Lab Results  Component Value Date   NA 142 09/02/2021   K 4.2 09/02/2021   CO2 23 09/02/2021   GLUCOSE 111 (H) 09/02/2021   BUN 14 09/02/2021   CREATININE 0.97 09/02/2021   CALCIUM 9.4 09/02/2021   EGFR 60 09/02/2021   GFRNONAA 65 02/08/2020   Lab Results  Component Value Date   CHOL 155 09/02/2021   HDL 54 09/02/2021   LDLCALC 81 09/02/2021   TRIG 114 09/02/2021   CHOLHDL 2.9 09/02/2021   Lab Results  Component Value Date   TSH 2.300 09/02/2021   Lab Results  Component Value Date   HGBA1C 5.5 09/02/2021   Lab Results  Component Value Date   WBC 5.7 09/02/2021   HGB 14.6 09/02/2021   HCT 43.9 09/02/2021   MCV 91 09/02/2021   PLT 207 09/02/2021   Lab Results  Component Value Date   ALT 22 09/02/2021   AST 21 09/02/2021   ALKPHOS 70 09/02/2021   BILITOT 0.6 09/02/2021   Lab Results  Component Value Date   VD25OH 36.4 08/28/2020     Review of Systems  Constitutional:  Negative for fatigue and unexpected weight change.  HENT:  Negative for nosebleeds.   Eyes:  Negative for visual disturbance.  Respiratory:  Negative for cough, chest tightness, shortness of breath and wheezing.   Cardiovascular:  Negative for chest pain, palpitations and leg swelling.  Gastrointestinal:  Negative for abdominal pain, constipation and diarrhea.  Neurological:  Negative for dizziness, weakness, light-headedness and headaches.    Patient Active Problem List   Diagnosis Date Noted   Atherosclerosis of native coronary artery  of native heart without angina pectoris 03/11/2021   Prediabetes 02/09/2020   Centrilobular emphysema ([place]) 10/27/2019   Aortic atherosclerosis ([place]) 03/08/2019   Osteopenia determined by x-ray 08/13/2017   Alopecia areata 07/23/2016   Heart palpitations 07/23/2016   Xerosis cutis 04/11/2015   Mixed hyperlipidemia 03/09/2015   Tobacco use disorder, moderate, in sustained remission 03/09/2015   History of vertebral compression fracture 03/09/2015   History of colon polyps 03/09/2015   Benign hypertension 03/09/2015   Microscopic hematuria 03/09/2015    Allergies  Allergen Reactions   Bupropion Other (See Comments)    Hair loss    Past Surgical History:  Procedure Laterality Date   AUGMENTATION MAMMAPLASTY Bilateral 1988   BREAST ENHANCEMENT SURGERY     BUNIONECTOMY Bilateral    CERVICAL CONE BIOPSY  1971   LSIL   EYE SURGERY  Lens replacement   facet shots in back  05/2016   [facility]-    TUBAL LIGATION      Social History   Tobacco Use   Smoking status: Former    Packs/day: 0.75    Years: 60.00    Total pack years: 45.00    Types: Cigarettes    Quit date: 12/20/2017    Years since quitting: 4.2   Smokeless tobacco: Never  Vaping Use   Vaping Use: Never used  Substance Use Topics   Alcohol use: Yes  Comment: 2 drinks or less/month   Drug use: No     Medication list has been reviewed and updated.  Current Meds  Medication Sig   aspirin 81 MG EC tablet Take 1 tablet (81 mg total) by mouth daily. Swallow whole.   atorvastatin (LIPITOR) 10 MG tablet TAKE 1 TABLET(10 MG) BY MOUTH DAILY AT 6 PM   Calcium Carbonate+Vitamin D 600-200 MG-UNIT TABS Take 1 tablet by mouth daily.   Fluocinolone Acetonide 0.01 % OIL fluocinolone acetonide oil 0.01 % ear drops  APPLY TO BOTH EARS TWICE DAILY AS NEEDED   hydrocortisone 2.5 % cream as needed.   losartan (COZAAR) 50 MG tablet Take 1 tablet (50 mg total) by mouth daily.   metoprolol succinate (TOPROL-XL) 25 MG 24 hr  tablet Take 25 mg by mouth daily. Take 1/2 tablet daily   Multiple Vitamins-Minerals (MULTIVITAMIN WOMENS 50+ ADV PO) Take by mouth.       03/04/2022    8:26 AM 09/02/2021    8:44 AM 03/11/2021   10:09 AM 08/28/2020    8:27 AM  GAD 7 : Generalized Anxiety Score  Nervous, Anxious, on Edge 0 0 0 0  Control/stop worrying 0 0 0 0  Worry too much - different things 0 0 0 0  Trouble relaxing 0 0 0 0  Restless 0 0 0 0  Easily annoyed or irritable 0 0 0 0  Afraid - awful might happen 0 0 0 0  Total GAD 7 Score 0 0 0 0  Anxiety Difficulty Not difficult at all Not difficult at all Not difficult at all        03/04/2022    8:26 AM 09/02/2021    8:43 AM 08/19/2021   10:08 AM  Depression screen PHQ 2/9  Decreased Interest  0 0  Down, Depressed, Hopeless 0 0 0  PHQ - 2 Score 0 0 0  Altered sleeping 2 1   Tired, decreased energy 0 0   Change in appetite 0 0   Feeling bad or failure about yourself  0 0   Trouble concentrating 0 0   Moving slowly or fidgety/restless 0 0   Suicidal thoughts 0 0   PHQ-9 Score 2 1   Difficult doing work/chores Not difficult at all Not difficult at all     BP Readings from Last 3 Encounters:  03/04/22 128/68  09/27/21 (!) 204/71  09/02/21 128/66    Physical Exam Vitals and nursing note reviewed.  Constitutional:      General: She is not in acute distress.    Appearance: She is well-developed.  HENT:     Head: Normocephalic and atraumatic.  Neck:     Vascular: No carotid bruit.  Cardiovascular:     Rate and Rhythm: Normal rate and regular rhythm.     Pulses: Normal pulses.  Pulmonary:     Effort: Pulmonary effort is normal. No respiratory distress.  Musculoskeletal:     Cervical back: Normal range of motion.  Lymphadenopathy:     Cervical: No cervical adenopathy.  Skin:    General: Skin is warm and dry.     Capillary Refill: Capillary refill takes less than 2 seconds.     Findings: No rash.  Neurological:     General: No focal deficit  present.     Mental Status: She is alert and oriented to person, place, and time.  Psychiatric:        Mood and Affect: Mood normal.  Behavior: Behavior normal.     Wt Readings from Last 3 Encounters:  03/04/22 171 lb 12.8 oz (77.9 kg)  09/27/21 170 lb (77.1 kg)  09/02/21 173 lb (78.5 kg)    BP 128/68   Pulse 68   Ht _0  (1.676 m)   Wt 171 lb 12.8 oz (77.9 kg)   SpO2 98%   BMI 27.73 kg/m   Assessment and Plan: 1. Benign hypertension Clinically stable exam with well controlled BP. Tolerating medications without side effects at this time. Pt to continue current regimen and low sodium diet; benefits of regular exercise as able discussed.  2. Need for vaccination for pneumococcus - Pneumococcal conjugate vaccine 20-valent   Partially dictated using Editor, commissioning. Any errors are unintentional.  Halina Maidens, MD [facility]  03/04/2022

## 2022-04-10 DIAGNOSIS — H04123 Dry eye syndrome of bilateral lacrimal glands: Secondary | ICD-10-CM | POA: Diagnosis not present

## 2022-04-10 DIAGNOSIS — H40053 Ocular hypertension, bilateral: Secondary | ICD-10-CM | POA: Diagnosis not present

## 2022-04-10 DIAGNOSIS — H1031 Unspecified acute conjunctivitis, right eye: Secondary | ICD-10-CM | POA: Diagnosis not present

## 2022-04-10 DIAGNOSIS — Z961 Presence of intraocular lens: Secondary | ICD-10-CM | POA: Diagnosis not present

## 2022-05-06 DIAGNOSIS — R001 Bradycardia, unspecified: Secondary | ICD-10-CM | POA: Diagnosis not present

## 2022-05-06 DIAGNOSIS — I10 Essential (primary) hypertension: Secondary | ICD-10-CM | POA: Diagnosis not present

## 2022-05-06 DIAGNOSIS — I2510 Atherosclerotic heart disease of native coronary artery without angina pectoris: Secondary | ICD-10-CM | POA: Diagnosis not present

## 2022-05-06 DIAGNOSIS — I493 Ventricular premature depolarization: Secondary | ICD-10-CM | POA: Diagnosis not present

## 2022-05-06 DIAGNOSIS — R0602 Shortness of breath: Secondary | ICD-10-CM | POA: Diagnosis not present

## 2022-05-06 DIAGNOSIS — R002 Palpitations: Secondary | ICD-10-CM | POA: Diagnosis not present

## 2022-07-11 DIAGNOSIS — H40053 Ocular hypertension, bilateral: Secondary | ICD-10-CM | POA: Diagnosis not present

## 2022-07-11 DIAGNOSIS — Z961 Presence of intraocular lens: Secondary | ICD-10-CM | POA: Diagnosis not present

## 2022-07-11 DIAGNOSIS — H04123 Dry eye syndrome of bilateral lacrimal glands: Secondary | ICD-10-CM | POA: Diagnosis not present

## 2022-07-11 DIAGNOSIS — H35371 Puckering of macula, right eye: Secondary | ICD-10-CM | POA: Diagnosis not present

## 2022-07-25 DIAGNOSIS — H40053 Ocular hypertension, bilateral: Secondary | ICD-10-CM | POA: Diagnosis not present

## 2022-07-25 DIAGNOSIS — Z961 Presence of intraocular lens: Secondary | ICD-10-CM | POA: Diagnosis not present

## 2022-07-25 DIAGNOSIS — H04123 Dry eye syndrome of bilateral lacrimal glands: Secondary | ICD-10-CM | POA: Diagnosis not present

## 2022-07-25 DIAGNOSIS — H35371 Puckering of macula, right eye: Secondary | ICD-10-CM | POA: Diagnosis not present

## 2022-07-27 ENCOUNTER — Other Ambulatory Visit: Payer: Self-pay | Admitting: Internal Medicine

## 2022-07-27 DIAGNOSIS — I10 Essential (primary) hypertension: Secondary | ICD-10-CM

## 2022-08-12 ENCOUNTER — Other Ambulatory Visit: Payer: Self-pay | Admitting: Internal Medicine

## 2022-08-12 DIAGNOSIS — Z1231 Encounter for screening mammogram for malignant neoplasm of breast: Secondary | ICD-10-CM

## 2022-08-26 ENCOUNTER — Other Ambulatory Visit: Payer: Self-pay | Admitting: Internal Medicine

## 2022-08-26 DIAGNOSIS — E782 Mixed hyperlipidemia: Secondary | ICD-10-CM

## 2022-08-27 ENCOUNTER — Ambulatory Visit (INDEPENDENT_AMBULATORY_CARE_PROVIDER_SITE_OTHER): Payer: Medicare Other

## 2022-08-27 VITALS — BP 150/72 | Ht 66.0 in | Wt 174.4 lb

## 2022-08-27 DIAGNOSIS — Z0000 Encounter for general adult medical examination without abnormal findings: Secondary | ICD-10-CM

## 2022-08-27 NOTE — Progress Notes (Signed)
Subjective:   Melinda Perry is a 77 y.o. female who presents for Medicare Annual (Subsequent) preventive examination.  Review of Systems     Cardiac Risk Factors include: advanced age (>66men, >58 women);hypertension;dyslipidemia     Objective:    Today's Vitals   08/27/22 1051  BP: (!) 150/72  Weight: 174 lb 6.4 oz (79.1 kg)  Height: 5\' 6"  (1.676 m)   Body mass index is 28.15 kg/m.     08/27/2022   10:57 AM 09/27/2021    7:11 PM 08/19/2021   10:09 AM 08/15/2020   10:18 AM 05/30/2019    8:12 PM 08/09/2018   10:14 AM 08/05/2017   10:10 AM  Advanced Directives  Does Patient Have a Medical Advance Directive? Yes Yes Yes Yes No Yes Yes  Type of Estate agent of [place];Living will Healthcare Power of [place];Living will Healthcare Power of [place];Living will Healthcare Power of [place];Living will  Living will;Healthcare Power of [employer] Power of [place];Living will  Does patient want to make changes to medical advance directive? Yes (Inpatient - patient defers changing a medical advance directive and declines information at this time)        Copy of Healthcare Power of Attorney in Chart? Yes - validated most recent copy scanned in chart (See row information)  Yes - validated most recent copy scanned in chart (See row information) Yes - validated most recent copy scanned in chart (See row information)  Yes - validated most recent copy scanned in chart (See row information) No - copy requested  Would patient like information on creating a medical advance directive? No - Patient declined;Yes (Inpatient - patient defers creating a medical advance directive and declines information at this time)          Current Medications (verified) Outpatient Encounter Medications as of 08/27/2022  Medication Sig   aspirin 81 MG EC tablet Take 1 tablet (81 mg total) by mouth daily. Swallow whole.   atorvastatin (LIPITOR) 10 MG tablet TAKE 1 TABLET(10 MG) BY  MOUTH DAILY AT 6 PM   Calcium Carbonate+Vitamin D 600-200 MG-UNIT TABS Take 1 tablet by mouth daily.   Fluocinolone Acetonide 0.01 % OIL fluocinolone acetonide oil 0.01 % ear drops  APPLY TO BOTH EARS TWICE DAILY AS NEEDED   hydrocortisone 2.5 % cream as needed.   losartan (COZAAR) 50 MG tablet TAKE 1 TABLET(50 MG) BY MOUTH DAILY   metoprolol succinate (TOPROL-XL) 25 MG 24 hr tablet Take 25 mg by mouth daily. Take 1/2 tablet daily   Multiple Vitamins-Minerals (MULTIVITAMIN WOMENS 50+ ADV PO) Take by mouth.   [DISCONTINUED] atorvastatin (LIPITOR) 10 MG tablet TAKE 1 TABLET(10 MG) BY MOUTH DAILY AT 6 PM   [DISCONTINUED] omeprazole (PRILOSEC) 10 MG capsule Take 10 mg by mouth daily. TEMPERARY- OTC for Keto Diet   No facility-administered encounter medications on file as of 08/27/2022.    Allergies (verified) Bupropion   History: Past Medical History:  Diagnosis Date   Ankle pain 04/11/2015   Cancer (HCC)    skin ca -squamous cell on face   Centrilobular emphysema ([facility]) 10/27/2019   High cholesterol    Hypertension    PVC (premature ventricular contraction)    Past Surgical History:  Procedure Laterality Date   AUGMENTATION MAMMAPLASTY Bilateral 1988   BREAST ENHANCEMENT SURGERY     BUNIONECTOMY Bilateral    CERVICAL CONE BIOPSY  1971   LSIL   EYE SURGERY  Lens replacement   facet shots in back  05/2016   [facility]-    TUBAL LIGATION     Family History  Problem Relation Age of Onset   Lung cancer Mother    Alcohol abuse Mother    Cancer Mother    Pulmonary fibrosis Father    Alcohol abuse Sister    Breast cancer Neg Hx    Social History   Socioeconomic History   Marital status: Divorced    Spouse name: Not on file   Number of children: 2   Years of education: some college   Highest education level: 12th grade  Occupational History   Occupation: Retired  Tobacco Use   Smoking status: Former    Packs/day: 0.75    Years: 60.00    Additional pack years: 0.00     Total pack years: 45.00    Types: Cigarettes    Quit date: 12/20/2017    Years since quitting: 4.6   Smokeless tobacco: Never  Vaping Use   Vaping Use: Never used  Substance and Sexual Activity   Alcohol use: Yes    Comment: 2 drinks or less/month   Drug use: No   Sexual activity: Not Currently    Birth control/protection: None  Other Topics Concern   Not on file  Social History Narrative   Pt lives alone   Social Determinants of Health   Financial Resource Strain: Low Risk  (08/27/2022)   Overall Financial Resource Strain (CARDIA)    Difficulty of Paying Living Expenses: Not hard at all  Food Insecurity: No Food Insecurity (08/27/2022)   Hunger Vital Sign    Worried About Running Out of Food in the Last Year: Never true    Ran Out of Food in the Last Year: Never true  Transportation Needs: No Transportation Needs (08/27/2022)   PRAPARE - Administrator, Civil Service (Medical): No    Lack of Transportation (Non-Medical): No  Physical Activity: Sufficiently Active (08/27/2022)   Exercise Vital Sign    Days of Exercise per Week: 6 days    Minutes of Exercise per Session: 40 min  Stress: No Stress Concern Present (08/27/2022)   [employer] of Occupational Health - Occupational Stress Questionnaire    Feeling of Stress : Not at all  Social Connections: Moderately Isolated (08/27/2022)   Social Connection and Isolation Panel [NHANES]    Frequency of Communication with Friends and Family: More than three times a week    Frequency of Social Gatherings with Friends and Family: Twice a week    Attends Religious Services: Never    Database administrator or Organizations: Yes    Attends Engineer, structural: More than 4 times per year    Marital Status: Divorced    Tobacco Counseling Counseling given: Not Answered   Clinical Intake:  Pre-visit preparation completed: Yes  Pain : No/denies pain     Nutritional Risks: None Diabetes: No  How often  do you need to have someone help you when you read instructions, pamphlets, or other written materials from your doctor or pharmacy?: 1 - Never  Diabetic?no  Interpreter Needed?: No  Information entered by :: Kennedy Bucker, LPN   Activities of Daily Living    08/27/2022   10:59 AM  In your present state of health, do you have any difficulty performing the following activities:  Hearing? 1  Vision? 0  Difficulty concentrating or making decisions? 0  Walking or climbing stairs? 0  Dressing or bathing? 0  Doing errands, shopping? 0  Preparing Food and eating ? N  Using the Toilet? N  In the past six months, have you accidently leaked urine? N  Do you have problems with loss of bowel control? N  Managing your Medications? N  Managing your Finances? N  Housekeeping or managing your Housekeeping? N    Patient Care Team: Reubin Milan, MD as PCP - General (Internal Medicine) Marcina Millard, MD as Consulting Physician (Cardiology) Dasher, Cliffton Asters, MD as Consulting Physician (Dermatology) [facility] (Pain Medicine)  Indicate any recent Medical Services you may have received from other than [facility] providers in the past year (date may be approximate).     Assessment:   This is a routine wellness examination for Jovina.  Hearing/Vision screen Hearing Screening - Comments:: No aids Vision Screening - Comments:: Wears glasses- [facility]  Dietary issues and exercise activities discussed: Current Exercise Habits: Home exercise routine, Type of exercise: walking, Time (Minutes): 40, Frequency (Times/Week): 6, Weekly Exercise (Minutes/Week): 240, Intensity: Mild   Goals Addressed             This Visit's Progress    DIET - EAT MORE FRUITS AND VEGETABLES         Depression Screen    08/27/2022   10:55 AM 03/04/2022    8:26 AM 09/02/2021    8:43 AM 08/19/2021   10:08 AM 03/11/2021   10:08 AM 08/28/2020    8:27 AM 08/15/2020   10:17 AM  PHQ 2/9 Scores   PHQ - 2 Score 0 0 0 0 0 0 0  PHQ- 9 Score 0 2 1  0 0     Fall Risk    08/27/2022   10:58 AM 03/04/2022    8:26 AM 09/02/2021    8:44 AM 08/19/2021   10:10 AM 03/11/2021   10:09 AM  Fall Risk   Falls in the past year? 0 1 0 0 0  Number falls in past yr: 0 1 0 0 0  Injury with Fall? 0 0 0 0 0  Risk for fall due to : History of fall(s) History of fall(s) No Fall Risks No Fall Risks No Fall Risks  Follow up Falls prevention discussed;Falls evaluation completed Falls evaluation completed Falls evaluation completed Falls prevention discussed Falls evaluation completed    FALL RISK PREVENTION PERTAINING TO THE HOME:  Any stairs in or around the home? Yes  If so, are there any without handrails? No  Home free of loose throw rugs in walkways, pet beds, electrical cords, etc? Yes  Adequate lighting in your home to reduce risk of falls? Yes   ASSISTIVE DEVICES UTILIZED TO PREVENT FALLS:  Life alert? No  Use of a cane, walker or w/c? No  Grab bars in the bathroom? Yes  Shower chair or bench in shower? Yes  Elevated toilet seat or a handicapped toilet? No   TIMED UP AND GO:  Was the test performed? Yes .  Length of time to ambulate 10 feet: 4 sec.   Gait steady and fast without use of assistive device  Cognitive Function:        08/27/2022   11:08 AM 08/09/2018   10:17 AM 08/05/2017   10:14 AM 07/23/2016    9:33 AM  6CIT Screen  What Year? 0 points 0 points 0 points 0 points  What month? 0 points 0 points 0 points 0 points  What time? 0 points 0 points 0 points 0 points  Count back from 20 0 points  0 points 0 points 0 points  Months in reverse 0 points 0 points 0 points 0 points  Repeat phrase 0 points 0 points 0 points 0 points  Total Score 0 points 0 points 0 points 0 points    Immunizations Immunization History  Administered Date(s) Administered   Fluad Quad(high Dose 65+) 12/16/2018   Influenza Split 01/29/2009, 03/31/2009, 01/07/2011   Influenza, High Dose Seasonal PF  01/06/2017   Influenza-Unspecified 01/13/2016, 12/16/2019, 01/29/2021, 12/11/2021   PFIZER Comirnaty(Gray Top)Covid-19 Tri-Sucrose Vaccine 04/20/2019   PFIZER(Purple Top)SARS-COV-2 Vaccination 04/20/2019, 05/14/2019, 12/24/2019, 07/09/2020, 07/31/2021   PNEUMOCOCCAL CONJUGATE-20 03/04/2022   Pfizer Covid-19 Vaccine Bivalent Booster 59yrs & up 12/15/2020   Pfizer Fall 2023 Covid-19 Vaccine 1yrs thru 75yrs. 12/25/2021   Pneumococcal Conjugate-13 11/16/2013   Pneumococcal Polysaccharide-23 07/29/2009, 04/01/2010   Tdap 04/01/2010, 01/07/2011, 08/28/2020   Zoster Recombinat (Shingrix) 07/24/2016, 11/24/2016   Zoster, Live 04/01/2010    TDAP status: Up to date  Flu Vaccine status: Up to date  Pneumococcal vaccine status: Up to date  Covid-19 vaccine status: Completed vaccines  Qualifies for Shingles Vaccine? Yes   Zostavax completed Yes   Shingrix Completed?: Yes  Screening Tests Health Maintenance  Topic Date Due   COVID-19 Vaccine (8 - 2023-24 season) 02/19/2022   MAMMOGRAM  09/24/2022   INFLUENZA VACCINE  10/30/2022   Lung Cancer Screening  12/06/2022   Medicare Annual Wellness (AWV)  08/27/2023   DTaP/Tdap/Td (4 - Td or Tdap) 08/29/2030   Pneumonia Vaccine 89+ Years old  Completed   DEXA SCAN  Completed   Zoster Vaccines- Shingrix  Completed   Hepatitis C Screening  Addressed   HPV VACCINES  Aged Out   Colonoscopy  Discontinued    Health Maintenance  Health Maintenance Due  Topic Date Due   COVID-19 Vaccine (8 - 2023-24 season) 02/19/2022    Colorectal cancer screening: No longer required.   Mammogram status: Ordered scheduled for 09/25/22. Pt provided with contact info and advised to call to schedule appt.   Bone Density status: Completed 09/19/19. Results reflect: Bone density results: OSTEOPENIA. Repeat every 5 years.  Lung Cancer Screening: (Low Dose CT Chest recommended if Age 38-80 years, 30 pack-year currently smoking OR have quit w/in 15years.) does  qualify.   Lung Cancer Screening Referral: ordered 12/09/21, gets one every year  Additional Screening:  Hepatitis C Screening: does qualify; Completed 04/01/13  Vision Screening: Recommended annual ophthalmology exams for early detection of glaucoma and other disorders of the eye. Is the patient up to date with their annual eye exam?  Yes  Who is the provider or what is the name of the office in which the patient attends annual eye exams? Dr.Wood in [place] If pt is not established with a provider, would they like to be referred to a provider to establish care? No .   Dental Screening: Recommended annual dental exams for proper oral hygiene  Community Resource Referral / Chronic Care Management: CRR required this visit?  No   CCM required this visit?  No      Plan:     I have personally reviewed and noted the following in the patient's chart:   Medical and social history Use of alcohol, tobacco or illicit drugs  Current medications and supplements including opioid prescriptions. Patient is not currently taking opioid prescriptions. Functional ability and status Nutritional status Physical activity Advanced directives List of other physicians Hospitalizations, surgeries, and ER visits in previous 12 months Vitals Screenings to include cognitive, depression, and  falls Referrals and appointments  In addition, I have reviewed and discussed with patient certain preventive protocols, quality metrics, and best practice recommendations. A written personalized care plan for preventive services as well as general preventive health recommendations were provided to patient.     Hal Hope, LPN   [id number]   Nurse Notes: none

## 2022-08-27 NOTE — Patient Instructions (Signed)
[name] , Thank you for taking time to come for your Medicare Wellness Visit. I appreciate your ongoing commitment to your health goals. Please review the following plan we discussed and let me know if I can assist you in the future.   These are the goals we discussed:  Goals      DIET - EAT MORE FRUITS AND VEGETABLES     DIET - INCREASE WATER INTAKE     Recommend to drink at least 6-8 8oz glasses of water per day.     Patient Stated     Pt states she would like to maintain weight since quitting smoking.      Weight < 200 lb (90.719 kg)        This is a list of the screening recommended for you and due dates:  Health Maintenance  Topic Date Due   COVID-19 Vaccine (8 - 2023-24 season) 02/19/2022   Mammogram  09/24/2022   Flu Shot  10/30/2022   Screening for Lung Cancer  12/06/2022   Medicare Annual Wellness Visit  08/27/2023   DTaP/Tdap/Td vaccine (4 - Td or Tdap) 08/29/2030   Pneumonia Vaccine  Completed   DEXA scan (bone density measurement)  Completed   Zoster (Shingles) Vaccine  Completed   Hepatitis C Screening  Addressed   HPV Vaccine  Aged Out   Colon Cancer Screening  Discontinued    Advanced directives: yes  Conditions/risks identified: none  Next appointment: Follow up in one year for your annual wellness visit 09/02/23 @ 10:45 am in person   Preventive Care 65 Years and Older, Female Preventive care refers to lifestyle choices and visits with your health care provider that can promote health and wellness. What does preventive care include? A yearly physical exam. This is also called an annual well check. Dental exams once or twice a year. Routine eye exams. Ask your health care provider how often you should have your eyes checked. Personal lifestyle choices, including: Daily care of your teeth and gums. Regular physical activity. Eating a healthy diet. Avoiding tobacco and drug use. Limiting alcohol use. Practicing safe sex. Taking low-dose aspirin  every day. Taking vitamin and mineral supplements as recommended by your health care provider. What happens during an annual well check? The services and screenings done by your health care provider during your annual well check will depend on your age, overall health, lifestyle risk factors, and family history of disease. Counseling  Your health care provider may ask you questions about your: Alcohol use. Tobacco use. Drug use. Emotional well-being. Home and relationship well-being. Sexual activity. Eating habits. History of falls. Memory and ability to understand (cognition). Work and work Astronomer. Reproductive health. Screening  You may have the following tests or measurements: Height, weight, and BMI. Blood pressure. Lipid and cholesterol levels. These may be checked every 5 years, or more frequently if you are over 94 years old. Skin check. Lung cancer screening. You may have this screening every year starting at age 34 if you have a 30-pack-year history of smoking and currently smoke or have quit within the past 15 years. Fecal occult blood test (FOBT) of the stool. You may have this test every year starting at age 27. Flexible sigmoidoscopy or colonoscopy. You may have a sigmoidoscopy every 5 years or a colonoscopy every 10 years starting at age 4. Hepatitis C blood test. Hepatitis B blood test. Sexually transmitted disease (STD) testing. Diabetes screening. This is done by checking your blood sugar (glucose) after  you have not eaten for a while (fasting). You may have this done every 1-3 years. Bone density scan. This is done to screen for osteoporosis. You may have this done starting at age 36. Mammogram. This may be done every 1-2 years. Talk to your health care provider about how often you should have regular mammograms. Talk with your health care provider about your test results, treatment options, and if necessary, the need for more tests. Vaccines  Your health  care provider may recommend certain vaccines, such as: Influenza vaccine. This is recommended every year. Tetanus, diphtheria, and acellular pertussis (Tdap, Td) vaccine. You may need a Td booster every 10 years. Zoster vaccine. You may need this after age 6. Pneumococcal 13-valent conjugate (PCV13) vaccine. One dose is recommended after age 33. Pneumococcal polysaccharide (PPSV23) vaccine. One dose is recommended after age 48. Talk to your health care provider about which screenings and vaccines you need and how often you need them. This information is not intended to replace advice given to you by your health care provider. Make sure you discuss any questions you have with your health care provider. Document Released: 04/13/2015 Document Revised: 12/05/2015 Document Reviewed: 01/16/2015 Elsevier Interactive Patient Education  2017 [employer].  Fall Prevention in the Home Falls can cause injuries. They can happen to people of all ages. There are many things you can do to make your home safe and to help prevent falls. What can I do on the outside of my home? Regularly fix the edges of walkways and driveways and fix any cracks. Remove anything that might make you trip as you walk through a door, such as a raised step or threshold. Trim any bushes or trees on the path to your home. Use bright outdoor lighting. Clear any walking paths of anything that might make someone trip, such as rocks or tools. Regularly check to see if handrails are loose or broken. Make sure that both sides of any steps have handrails. Any raised decks and porches should have guardrails on the edges. Have any leaves, snow, or ice cleared regularly. Use sand or salt on walking paths during winter. Clean up any spills in your garage right away. This includes oil or grease spills. What can I do in the bathroom? Use night lights. Install grab bars by the toilet and in the tub and shower. Do not use towel bars as grab  bars. Use non-skid mats or decals in the tub or shower. If you need to sit down in the shower, use a plastic, non-slip stool. Keep the floor dry. Clean up any water that spills on the floor as soon as it happens. Remove soap buildup in the tub or shower regularly. Attach bath mats securely with double-sided non-slip rug tape. Do not have throw rugs and other things on the floor that can make you trip. What can I do in the bedroom? Use night lights. Make sure that you have a light by your bed that is easy to reach. Do not use any sheets or blankets that are too big for your bed. They should not hang down onto the floor. Have a firm chair that has side arms. You can use this for support while you get dressed. Do not have throw rugs and other things on the floor that can make you trip. What can I do in the kitchen? Clean up any spills right away. Avoid walking on wet floors. Keep items that you use a lot in easy-to-reach places. If you  need to reach something above you, use a strong step stool that has a grab bar. Keep electrical cords out of the way. Do not use floor polish or wax that makes floors slippery. If you must use wax, use non-skid floor wax. Do not have throw rugs and other things on the floor that can make you trip. What can I do with my stairs? Do not leave any items on the stairs. Make sure that there are handrails on both sides of the stairs and use them. Fix handrails that are broken or loose. Make sure that handrails are as long as the stairways. Check any carpeting to make sure that it is firmly attached to the stairs. Fix any carpet that is loose or worn. Avoid having throw rugs at the top or bottom of the stairs. If you do have throw rugs, attach them to the floor with carpet tape. Make sure that you have a light switch at the top of the stairs and the bottom of the stairs. If you do not have them, ask someone to add them for you. What else can I do to help prevent  falls? Wear shoes that: Do not have high heels. Have rubber bottoms. Are comfortable and fit you well. Are closed at the toe. Do not wear sandals. If you use a stepladder: Make sure that it is fully opened. Do not climb a closed stepladder. Make sure that both sides of the stepladder are locked into place. Ask someone to hold it for you, if possible. Clearly mark and make sure that you can see: Any grab bars or handrails. First and last steps. Where the edge of each step is. Use tools that help you move around (mobility aids) if they are needed. These include: Canes. Walkers. Scooters. Crutches. Turn on the lights when you go into a dark area. Replace any light bulbs as soon as they burn out. Set up your furniture so you have a clear path. Avoid moving your furniture around. If any of your floors are uneven, fix them. If there are any pets around you, be aware of where they are. Review your medicines with your doctor. Some medicines can make you feel dizzy. This can increase your chance of falling. Ask your doctor what other things that you can do to help prevent falls. This information is not intended to replace advice given to you by your health care provider. Make sure you discuss any questions you have with your health care provider. Document Released: 01/11/2009 Document Revised: 08/23/2015 Document Reviewed: 04/21/2014 Elsevier Interactive Patient Education  2017 [employer].

## 2022-09-03 ENCOUNTER — Encounter: Payer: Self-pay | Admitting: Internal Medicine

## 2022-09-03 ENCOUNTER — Ambulatory Visit (INDEPENDENT_AMBULATORY_CARE_PROVIDER_SITE_OTHER): Payer: Medicare Other | Admitting: Internal Medicine

## 2022-09-03 VITALS — BP 128/68 | HR 53 | Ht 66.0 in | Wt 173.0 lb

## 2022-09-03 DIAGNOSIS — I700 Atherosclerosis of aorta: Secondary | ICD-10-CM | POA: Diagnosis not present

## 2022-09-03 DIAGNOSIS — F17201 Nicotine dependence, unspecified, in remission: Secondary | ICD-10-CM | POA: Diagnosis not present

## 2022-09-03 DIAGNOSIS — Z0000 Encounter for general adult medical examination without abnormal findings: Secondary | ICD-10-CM | POA: Diagnosis not present

## 2022-09-03 DIAGNOSIS — M8580 Other specified disorders of bone density and structure, unspecified site: Secondary | ICD-10-CM | POA: Diagnosis not present

## 2022-09-03 DIAGNOSIS — Z1211 Encounter for screening for malignant neoplasm of colon: Secondary | ICD-10-CM | POA: Diagnosis not present

## 2022-09-03 DIAGNOSIS — I10 Essential (primary) hypertension: Secondary | ICD-10-CM

## 2022-09-03 DIAGNOSIS — E782 Mixed hyperlipidemia: Secondary | ICD-10-CM | POA: Diagnosis not present

## 2022-09-03 DIAGNOSIS — Z1231 Encounter for screening mammogram for malignant neoplasm of breast: Secondary | ICD-10-CM

## 2022-09-03 DIAGNOSIS — J432 Centrilobular emphysema: Secondary | ICD-10-CM

## 2022-09-03 DIAGNOSIS — R7303 Prediabetes: Secondary | ICD-10-CM | POA: Diagnosis not present

## 2022-09-03 LAB — POCT URINALYSIS DIPSTICK
Bilirubin, UA: NEGATIVE
Blood, UA: NEGATIVE
Glucose, UA: NEGATIVE
Ketones, UA: NEGATIVE
Leukocytes, UA: NEGATIVE
Nitrite, UA: NEGATIVE
Protein, UA: NEGATIVE
Spec Grav, UA: 1.01 (ref 1.010–1.025)
Urobilinogen, UA: 0.2 E.U./dL
pH, UA: 7 (ref 5.0–8.0)

## 2022-09-03 MED ORDER — FLUOCINOLONE ACETONIDE 0.01 % OT OIL: TOPICAL_OIL | OTIC | 0 refills | Status: DC

## 2022-09-03 MED ORDER — FLUOCINOLONE ACETONIDE 0.01 % OT OIL: TOPICAL_OIL | OTIC | 0 refills | Status: AC

## 2022-09-03 NOTE — Assessment & Plan Note (Addendum)
On calcium and vitamin D Last DEXA 2021 - recommend repeat but defers to next year

## 2022-09-03 NOTE — Assessment & Plan Note (Signed)
Stable exam with well controlled BP.  Currently taking losartan and metoprolol. Tolerating medications without concerns or side effects. Will continue to recommend low sodium diet and current regimen.  

## 2022-09-03 NOTE — Progress Notes (Signed)
Date:  09/03/2022   Name:  Melinda Perry [facility]   DOB:  October 16, 1945   MRN:  [id number]   Chief Complaint: Annual Exam Melinda Perry is a 77 y.o. female who presents today for her Complete Annual Exam. She feels well. She reports exercising everyday. She reports she is sleeping poorly. Breast complaints - itchy rash under breast in summer time.  Mammogram: scheduled 09/25/22 DEXA: 08/2019 osteopenia Colonoscopy: 2014 Lung cancer screening:  11/2021  Health Maintenance Due  Topic Date Due   COVID-19 Vaccine (8 - 2023-24 season) 02/19/2022    Immunization History  Administered Date(s) Administered   Fluad Quad(high Dose 65+) 12/16/2018   Influenza Split 01/29/2009, 03/31/2009, 01/07/2011   Influenza, High Dose Seasonal PF 01/06/2017   Influenza-Unspecified 01/13/2016, 12/16/2019, 01/29/2021, 12/11/2021   PFIZER Comirnaty(Gray Top)Covid-19 Tri-Sucrose Vaccine 04/20/2019   PFIZER(Purple Top)SARS-COV-2 Vaccination 04/20/2019, 05/14/2019, 12/24/2019, 07/09/2020, 07/31/2021   PNEUMOCOCCAL CONJUGATE-20 03/04/2022   Pfizer Covid-19 Vaccine Bivalent Booster 35yrs & up 12/15/2020   Pfizer Fall 2023 Covid-19 Vaccine 35yrs thru 65yrs. 12/25/2021   Pneumococcal Conjugate-13 11/16/2013   Pneumococcal Polysaccharide-23 07/29/2009, 04/01/2010   Respiratory Syncytial Virus Vaccine,Recomb Aduvanted(Arexvy) 04/22/2022   Tdap 04/01/2010, 01/07/2011, 08/28/2020   Zoster Recombinat (Shingrix) 07/24/2016, 11/24/2016   Zoster, Live 04/01/2010    Hypertension This is a chronic problem. The problem is controlled. Pertinent negatives include no chest pain, headaches, palpitations or shortness of breath. Past treatments include beta blockers and angiotensin blockers. The current treatment provides moderate improvement. Hypertensive end-organ damage includes CAD/MI. There is no history of kidney disease or CVA.  Hyperlipidemia This is a chronic problem. The problem is controlled. Pertinent negatives  include no chest pain or shortness of breath. Current antihyperlipidemic treatment includes statins. The current treatment provides significant improvement of lipids.  Diabetes She presents for her follow-up diabetic visit. Diabetes type: prediabetes. Pertinent negatives for hypoglycemia include no dizziness, headaches, nervousness/anxiousness or tremors. Pertinent negatives for diabetes include no chest pain, no fatigue, no polydipsia and no polyuria. Pertinent negatives for diabetic complications include no CVA.    Lab Results  Component Value Date   NA 142 09/02/2021   K 4.2 09/02/2021   CO2 23 09/02/2021   GLUCOSE 111 (H) 09/02/2021   BUN 14 09/02/2021   CREATININE 0.97 09/02/2021   CALCIUM 9.4 09/02/2021   EGFR 60 09/02/2021   GFRNONAA 65 02/08/2020   Lab Results  Component Value Date   CHOL 155 09/02/2021   HDL 54 09/02/2021   LDLCALC 81 09/02/2021   TRIG 114 09/02/2021   CHOLHDL 2.9 09/02/2021   Lab Results  Component Value Date   TSH 2.300 09/02/2021   Lab Results  Component Value Date   HGBA1C 5.5 09/02/2021   Lab Results  Component Value Date   WBC 5.7 09/02/2021   HGB 14.6 09/02/2021   HCT 43.9 09/02/2021   MCV 91 09/02/2021   PLT 207 09/02/2021   Lab Results  Component Value Date   ALT 22 09/02/2021   AST 21 09/02/2021   ALKPHOS 70 09/02/2021   BILITOT 0.6 09/02/2021   Lab Results  Component Value Date   VD25OH 36.4 08/28/2020     Review of Systems  Constitutional:  Negative for chills, fatigue and fever.  HENT:  Negative for congestion, hearing loss, tinnitus, trouble swallowing and voice change.   Eyes:  Negative for visual disturbance.  Respiratory:  Negative for cough, chest tightness, shortness of breath and wheezing.   Cardiovascular:  Negative for chest pain, palpitations  and leg swelling.  Gastrointestinal:  Negative for abdominal pain, constipation, diarrhea and vomiting.  Endocrine: Negative for polydipsia and polyuria.   Genitourinary:  Negative for dysuria, frequency, genital sores, vaginal bleeding and vaginal discharge.  Musculoskeletal:  Negative for arthralgias, gait problem and joint swelling.  Skin:  Positive for rash (recurrent heat rash under breasts - not currently). Negative for color change.  Neurological:  Negative for dizziness, tremors, light-headedness and headaches.  Hematological:  Negative for adenopathy. Does not bruise/bleed easily.  Psychiatric/Behavioral:  Negative for dysphoric mood and sleep disturbance. The patient is not nervous/anxious.     Patient Active Problem List   Diagnosis Date Noted   Atherosclerosis of native coronary artery of native heart without angina pectoris 03/11/2021   Prediabetes 02/09/2020   Centrilobular emphysema (HCC) 10/27/2019   Aortic atherosclerosis (HCC) 03/08/2019   Osteopenia determined by x-ray 08/13/2017   Alopecia areata 07/23/2016   Heart palpitations 07/23/2016   Xerosis cutis 04/11/2015   Mixed hyperlipidemia 03/09/2015   Tobacco use disorder, moderate, in sustained remission 03/09/2015   History of vertebral compression fracture 03/09/2015   History of colon polyps 03/09/2015   Essential hypertension 03/09/2015   Microscopic hematuria 03/09/2015    Allergies  Allergen Reactions   Bupropion Other (See Comments)    Hair loss    Past Surgical History:  Procedure Laterality Date   AUGMENTATION MAMMAPLASTY Bilateral 1988   BREAST ENHANCEMENT SURGERY     BUNIONECTOMY Bilateral    CERVICAL CONE BIOPSY  1971   LSIL   EYE SURGERY  Lens replacement   facet shots in back  05/2016   [facility]-    TUBAL LIGATION      Social History   Tobacco Use   Smoking status: Former    Packs/day: 0.75    Years: 60.00    Additional pack years: 0.00    Total pack years: 45.00    Types: Cigarettes    Quit date: 12/20/2017    Years since quitting: 4.7   Smokeless tobacco: Never  Vaping Use   Vaping Use: Never used  Substance Use Topics    Alcohol use: Yes    Comment: 2 drinks or less/month   Drug use: No     Medication list has been reviewed and updated.  Current Meds  Medication Sig   aspirin 81 MG EC tablet Take 1 tablet (81 mg total) by mouth daily. Swallow whole.   atorvastatin (LIPITOR) 10 MG tablet TAKE 1 TABLET(10 MG) BY MOUTH DAILY AT 6 PM   Calcium Carbonate+Vitamin D 600-200 MG-UNIT TABS Take 1 tablet by mouth daily.   hydrocortisone 2.5 % cream as needed.   losartan (COZAAR) 50 MG tablet TAKE 1 TABLET(50 MG) BY MOUTH DAILY   metoprolol succinate (TOPROL-XL) 25 MG 24 hr tablet Take 25 mg by mouth daily. Take 1/2 tablet daily   Multiple Vitamins-Minerals (MULTIVITAMIN WOMENS 50+ ADV PO) Take by mouth.   [DISCONTINUED] Fluocinolone Acetonide 0.01 % OIL fluocinolone acetonide oil 0.01 % ear drops  APPLY TO BOTH EARS TWICE DAILY AS NEEDED       09/03/2022    8:03 AM 03/04/2022    8:26 AM 09/02/2021    8:44 AM 03/11/2021   10:09 AM  GAD 7 : Generalized Anxiety Score  Nervous, Anxious, on Edge 0 0 0 0  Control/stop worrying 0 0 0 0  Worry too much - different things 0 0 0 0  Trouble relaxing 0 0 0 0  Restless 0 0 0 0  Easily annoyed or irritable 0 0 0 0  Afraid - awful might happen 0 0 0 0  Total GAD 7 Score 0 0 0 0  Anxiety Difficulty Not difficult at all Not difficult at all Not difficult at all Not difficult at all       09/03/2022    8:03 AM 08/27/2022   10:55 AM 03/04/2022    8:26 AM  Depression screen PHQ 2/9  Decreased Interest 0 0   Down, Depressed, Hopeless 0 0 0  PHQ - 2 Score 0 0 0  Altered sleeping 3 0 2  Tired, decreased energy 0 0 0  Change in appetite 2 0 0  Feeling bad or failure about yourself  0 0 0  Trouble concentrating 0 0 0  Moving slowly or fidgety/restless 0 0 0  Suicidal thoughts 0 0 0  PHQ-9 Score 5 0 2  Difficult doing work/chores Not difficult at all Not difficult at all Not difficult at all    BP Readings from Last 3 Encounters:  09/03/22 128/68  08/27/22 (!)  150/72  03/04/22 128/68    Physical Exam Vitals and nursing note reviewed.  Constitutional:      General: She is not in acute distress.    Appearance: She is well-developed.  HENT:     Head: Normocephalic and atraumatic.     Right Ear: Tympanic membrane and ear canal normal.     Left Ear: Tympanic membrane and ear canal normal.     Nose:     Right Sinus: No maxillary sinus tenderness.     Left Sinus: No maxillary sinus tenderness.  Eyes:     General: No scleral icterus.       Right eye: No discharge.        Left eye: No discharge.     Conjunctiva/sclera: Conjunctivae normal.  Neck:     Thyroid: No thyromegaly.     Vascular: No carotid bruit.  Cardiovascular:     Rate and Rhythm: Normal rate and regular rhythm.     Pulses: Normal pulses.     Heart sounds: Normal heart sounds.  Pulmonary:     Effort: Pulmonary effort is normal. No respiratory distress.     Breath sounds: No wheezing.  Chest:  Breasts:    Right: No mass, nipple discharge, skin change or tenderness.     Left: No mass, nipple discharge, skin change or tenderness.     Comments: Soft breast implants bilaterally Abdominal:     General: Bowel sounds are normal.     Palpations: Abdomen is soft.     Tenderness: There is no abdominal tenderness.  Musculoskeletal:     Cervical back: Normal range of motion. No erythema.     Right lower leg: No edema.     Left lower leg: No edema.  Lymphadenopathy:     Cervical: No cervical adenopathy.  Skin:    General: Skin is warm and dry.     Findings: No rash.  Neurological:     Mental Status: She is alert and oriented to person, place, and time.     Cranial Nerves: No cranial nerve deficit.     Sensory: No sensory deficit.     Deep Tendon Reflexes: Reflexes are normal and symmetric.  Psychiatric:        Attention and Perception: Attention normal.        Mood and Affect: Mood normal.     Wt Readings from Last 3 Encounters:  09/03/22 173 lb (78.5  kg)  08/27/22 174  lb 6.4 oz (79.1 kg)  03/04/22 171 lb 12.8 oz (77.9 kg)    BP 128/68   Pulse (!) 53   Ht 5\' 6"  (1.676 m)   Wt 173 lb (78.5 kg)   SpO2 98%   BMI 27.92 kg/m   Assessment and Plan:  Problem List Items Addressed This Visit     Tobacco use disorder, moderate, in sustained remission    Participating in lung cancer screening - next due September.      Prediabetes (Chronic)    Managed with diet alone.      Relevant Orders   Hemoglobin A1c   Osteopenia determined by x-ray (Chronic)    On calcium and vitamin D Last DEXA 2021 - recommend repeat but defers to next year      Mixed hyperlipidemia (Chronic)    Tolerating statin medications.  No side effects noted. LDL is  Lab Results  Component Value Date   LDLCALC 81 09/02/2021        Relevant Orders   Lipid panel   Essential hypertension    Stable exam with well controlled BP.  Currently taking losartan and metoprolol. Tolerating medications without concerns or side effects. Will continue to recommend low sodium diet and current regimen.       Relevant Orders   CBC with Differential/Platelet   Comprehensive metabolic panel   TSH   POCT urinalysis dipstick   Centrilobular emphysema (HCC) (Chronic)   Aortic atherosclerosis (HCC) (Chronic)    On appropriate statin and aspirin Lab Results  Component Value Date   LDLCALC 81 09/02/2021        Relevant Orders   Lipid panel   Other Visit Diagnoses     Annual physical exam    -  Primary   Encounter for screening mammogram for breast cancer       scheduled   Colon cancer screening       last done 2014 was normal due to age and lack of symptoms and history will defer       Return in about 6 months (around 03/05/2023) for HTN.   Partially dictated using Dragon software, any errors are not intentional.  Reubin Milan, MD [facility] [place], [place]

## 2022-09-03 NOTE — Patient Instructions (Signed)
Lamisil or other over the counter anti-fungal.  Allegra or Claritin as needed for post nasal drainage.

## 2022-09-03 NOTE — Assessment & Plan Note (Signed)
Tolerating statin medications.  No side effects noted. LDL is  Lab Results  Component Value Date   LDLCALC 81 09/02/2021

## 2022-09-03 NOTE — Assessment & Plan Note (Signed)
Participating in lung cancer screening - next due September.

## 2022-09-03 NOTE — Assessment & Plan Note (Signed)
Managed with diet alone 

## 2022-09-03 NOTE — Assessment & Plan Note (Signed)
On appropriate statin and aspirin Lab Results  Component Value Date   LDLCALC 81 09/02/2021

## 2022-09-04 LAB — CBC WITH DIFFERENTIAL/PLATELET
Basophils Absolute: 0 10*3/uL (ref 0.0–0.2)
Basos: 1 %
EOS (ABSOLUTE): 0.1 10*3/uL (ref 0.0–0.4)
Eos: 1 %
Hematocrit: 42.5 % (ref 34.0–46.6)
Hemoglobin: 14.1 g/dL (ref 11.1–15.9)
Immature Grans (Abs): 0 10*3/uL (ref 0.0–0.1)
Immature Granulocytes: 0 %
Lymphocytes Absolute: 1.2 10*3/uL (ref 0.7–3.1)
Lymphs: 25 %
MCH: 29.9 pg (ref 26.6–33.0)
MCHC: 33.2 g/dL (ref 31.5–35.7)
MCV: 90 fL (ref 79–97)
Monocytes Absolute: 0.4 10*3/uL (ref 0.1–0.9)
Monocytes: 7 %
Neutrophils Absolute: 3.3 10*3/uL (ref 1.4–7.0)
Neutrophils: 66 %
Platelets: 191 10*3/uL (ref 150–450)
RBC: 4.72 x10E6/uL (ref 3.77–5.28)
RDW: 13.4 % (ref 11.7–15.4)
WBC: 5 10*3/uL (ref 3.4–10.8)

## 2022-09-04 LAB — COMPREHENSIVE METABOLIC PANEL
ALT: 19 IU/L (ref 0–32)
AST: 22 IU/L (ref 0–40)
Albumin/Globulin Ratio: 1.7 (ref 1.2–2.2)
Albumin: 4.2 g/dL (ref 3.8–4.8)
Alkaline Phosphatase: 72 IU/L (ref 44–121)
BUN/Creatinine Ratio: 15 (ref 12–28)
BUN: 14 mg/dL (ref 8–27)
Bilirubin Total: 0.5 mg/dL (ref 0.0–1.2)
CO2: 23 mmol/L (ref 20–29)
Calcium: 10 mg/dL (ref 8.7–10.3)
Chloride: 104 mmol/L (ref 96–106)
Creatinine, Ser: 0.96 mg/dL (ref 0.57–1.00)
Globulin, Total: 2.5 g/dL (ref 1.5–4.5)
Glucose: 106 mg/dL — ABNORMAL HIGH (ref 70–99)
Potassium: 4.4 mmol/L (ref 3.5–5.2)
Sodium: 140 mmol/L (ref 134–144)
Total Protein: 6.7 g/dL (ref 6.0–8.5)
eGFR: 61 mL/min/{1.73_m2} (ref >59)

## 2022-09-04 LAB — LIPID PANEL
Chol/HDL Ratio: 3.1 ratio (ref 0.0–4.4)
Cholesterol, Total: 152 mg/dL (ref 100–199)
HDL: 49 mg/dL (ref >39)
LDL Chol Calc (NIH): 81 mg/dL (ref 0–99)
Triglycerides: 122 mg/dL (ref 0–149)
VLDL Cholesterol Cal: 22 mg/dL (ref 5–40)

## 2022-09-04 LAB — TSH: TSH: 2.35 u[IU]/mL (ref 0.450–4.500)

## 2022-09-04 LAB — HEMOGLOBIN A1C
Est. average glucose Bld gHb Est-mCnc: 108 mg/dL
Hgb A1c MFr Bld: 5.4 % (ref 4.8–5.6)

## 2022-09-25 ENCOUNTER — Ambulatory Visit
Admission: RE | Admit: 2022-09-25 | Discharge: 2022-09-25 | Disposition: A | Discharge status: Home / Self Care | Payer: Medicare Other | Source: Ambulatory Visit | Attending: Internal Medicine | Admitting: Internal Medicine

## 2022-09-25 DIAGNOSIS — Z1231 Encounter for screening mammogram for malignant neoplasm of breast: Secondary | ICD-10-CM | POA: Insufficient documentation

## 2022-10-08 ENCOUNTER — Telehealth: Payer: Self-pay | Admitting: *Deleted

## 2022-10-08 NOTE — Telephone Encounter (Signed)
FYI: Per CMS Age guidelines for lung cancer screening, patient will no longer qualify for lung cancer screening. Patient must be between age 78-77 to qualify. Any further follow up needed can be done through PCP.

## 2022-10-08 NOTE — Telephone Encounter (Signed)
CMS guidelines are still for Age 78-77. Patient's who still have private insurance (through an employer) are still eligible for Age 57-80.

## 2022-10-26 ENCOUNTER — Other Ambulatory Visit: Payer: Self-pay | Admitting: Internal Medicine

## 2022-10-26 DIAGNOSIS — I10 Essential (primary) hypertension: Secondary | ICD-10-CM

## 2022-11-05 DIAGNOSIS — I700 Atherosclerosis of aorta: Secondary | ICD-10-CM | POA: Diagnosis not present

## 2022-11-05 DIAGNOSIS — I10 Essential (primary) hypertension: Secondary | ICD-10-CM | POA: Diagnosis not present

## 2022-11-05 DIAGNOSIS — I2510 Atherosclerotic heart disease of native coronary artery without angina pectoris: Secondary | ICD-10-CM | POA: Diagnosis not present

## 2022-11-05 DIAGNOSIS — I493 Ventricular premature depolarization: Secondary | ICD-10-CM | POA: Diagnosis not present

## 2022-11-05 DIAGNOSIS — R001 Bradycardia, unspecified: Secondary | ICD-10-CM | POA: Diagnosis not present

## 2022-11-05 DIAGNOSIS — R002 Palpitations: Secondary | ICD-10-CM | POA: Diagnosis not present

## 2022-11-24 DIAGNOSIS — H04123 Dry eye syndrome of bilateral lacrimal glands: Secondary | ICD-10-CM | POA: Diagnosis not present

## 2022-11-24 DIAGNOSIS — H35371 Puckering of macula, right eye: Secondary | ICD-10-CM | POA: Diagnosis not present

## 2022-11-24 DIAGNOSIS — H40053 Ocular hypertension, bilateral: Secondary | ICD-10-CM | POA: Diagnosis not present

## 2022-11-24 DIAGNOSIS — Z961 Presence of intraocular lens: Secondary | ICD-10-CM | POA: Diagnosis not present

## 2022-12-16 DIAGNOSIS — L821 Other seborrheic keratosis: Secondary | ICD-10-CM | POA: Diagnosis not present

## 2022-12-16 DIAGNOSIS — L218 Other seborrheic dermatitis: Secondary | ICD-10-CM | POA: Diagnosis not present

## 2022-12-16 DIAGNOSIS — L720 Epidermal cyst: Secondary | ICD-10-CM | POA: Diagnosis not present

## 2022-12-16 DIAGNOSIS — L304 Erythema intertrigo: Secondary | ICD-10-CM | POA: Diagnosis not present

## 2022-12-16 DIAGNOSIS — D225 Melanocytic nevi of trunk: Secondary | ICD-10-CM | POA: Diagnosis not present

## 2022-12-16 DIAGNOSIS — L814 Other melanin hyperpigmentation: Secondary | ICD-10-CM | POA: Diagnosis not present

## 2022-12-16 DIAGNOSIS — Z08 Encounter for follow-up examination after completed treatment for malignant neoplasm: Secondary | ICD-10-CM | POA: Diagnosis not present

## 2022-12-16 DIAGNOSIS — Z85828 Personal history of other malignant neoplasm of skin: Secondary | ICD-10-CM | POA: Diagnosis not present

## 2022-12-30 DIAGNOSIS — L600 Ingrowing nail: Secondary | ICD-10-CM | POA: Diagnosis not present

## 2022-12-30 DIAGNOSIS — M205X1 Other deformities of toe(s) (acquired), right foot: Secondary | ICD-10-CM | POA: Diagnosis not present

## 2022-12-30 DIAGNOSIS — L603 Nail dystrophy: Secondary | ICD-10-CM | POA: Diagnosis not present

## 2022-12-30 DIAGNOSIS — M79675 Pain in left toe(s): Secondary | ICD-10-CM | POA: Diagnosis not present

## 2022-12-30 DIAGNOSIS — M205X2 Other deformities of toe(s) (acquired), left foot: Secondary | ICD-10-CM | POA: Diagnosis not present

## 2022-12-30 DIAGNOSIS — M79674 Pain in right toe(s): Secondary | ICD-10-CM | POA: Diagnosis not present

## 2023-01-23 ENCOUNTER — Telehealth: Payer: Self-pay | Admitting: Internal Medicine

## 2023-01-23 NOTE — Telephone Encounter (Signed)
Copied from CRM [id number]. Topic: General - Other >> Jan 23, 2023  1:59 PM Phill Myron wrote: Ms Much would like for Dr Judithann Graves to Please order the ct lung cancer screening for her. Please advise

## 2023-01-26 ENCOUNTER — Other Ambulatory Visit: Payer: Self-pay | Admitting: Internal Medicine

## 2023-01-26 DIAGNOSIS — F17201 Nicotine dependence, unspecified, in remission: Secondary | ICD-10-CM

## 2023-01-28 DIAGNOSIS — M47817 Spondylosis without myelopathy or radiculopathy, lumbosacral region: Secondary | ICD-10-CM | POA: Insufficient documentation

## 2023-01-28 DIAGNOSIS — M47814 Spondylosis without myelopathy or radiculopathy, thoracic region: Secondary | ICD-10-CM | POA: Insufficient documentation

## 2023-01-28 DIAGNOSIS — M48061 Spinal stenosis, lumbar region without neurogenic claudication: Secondary | ICD-10-CM | POA: Insufficient documentation

## 2023-01-28 DIAGNOSIS — M5416 Radiculopathy, lumbar region: Secondary | ICD-10-CM | POA: Diagnosis not present

## 2023-01-28 DIAGNOSIS — M48062 Spinal stenosis, lumbar region with neurogenic claudication: Secondary | ICD-10-CM | POA: Diagnosis not present

## 2023-02-04 DIAGNOSIS — M47814 Spondylosis without myelopathy or radiculopathy, thoracic region: Secondary | ICD-10-CM | POA: Diagnosis not present

## 2023-02-04 DIAGNOSIS — M5416 Radiculopathy, lumbar region: Secondary | ICD-10-CM | POA: Diagnosis not present

## 2023-02-12 DIAGNOSIS — M48062 Spinal stenosis, lumbar region with neurogenic claudication: Secondary | ICD-10-CM | POA: Diagnosis not present

## 2023-02-12 DIAGNOSIS — M47817 Spondylosis without myelopathy or radiculopathy, lumbosacral region: Secondary | ICD-10-CM | POA: Diagnosis not present

## 2023-02-12 DIAGNOSIS — M47814 Spondylosis without myelopathy or radiculopathy, thoracic region: Secondary | ICD-10-CM | POA: Diagnosis not present

## 2023-02-12 DIAGNOSIS — M5416 Radiculopathy, lumbar region: Secondary | ICD-10-CM | POA: Diagnosis not present

## 2023-03-05 ENCOUNTER — Encounter: Payer: Self-pay | Admitting: Internal Medicine

## 2023-03-05 ENCOUNTER — Ambulatory Visit (INDEPENDENT_AMBULATORY_CARE_PROVIDER_SITE_OTHER): Payer: Medicare Other | Admitting: Internal Medicine

## 2023-03-05 VITALS — BP 126/62 | HR 55 | Ht 66.0 in | Wt 176.0 lb

## 2023-03-05 DIAGNOSIS — I10 Essential (primary) hypertension: Secondary | ICD-10-CM | POA: Diagnosis not present

## 2023-03-05 DIAGNOSIS — R911 Solitary pulmonary nodule: Secondary | ICD-10-CM | POA: Diagnosis not present

## 2023-03-05 NOTE — Assessment & Plan Note (Signed)
Controlled BP with normal exam. Current regimen is losartan and metoprolol. Renal function is normal.  Will continue same medications; encourage continued reduced sodium diet.

## 2023-03-05 NOTE — Progress Notes (Signed)
Date:  03/05/2023   Name:  Melinda Perry [facility]   DOB:  03-16-1946   MRN:  [id number]   Chief Complaint: Hypertension  Hypertension This is a chronic problem. The problem is controlled. Pertinent negatives include no chest pain, headaches, palpitations or shortness of breath. Past treatments include angiotensin blockers and beta blockers. The current treatment provides significant improvement.  Lung nodule - seen on last CT chest.  Will repeat scan for stability.   Review of Systems  Constitutional:  Negative for appetite change, fatigue and unexpected weight change.  HENT:  Negative for nosebleeds.   Eyes:  Negative for visual disturbance.  Respiratory:  Negative for cough, chest tightness, shortness of breath and wheezing.   Cardiovascular:  Negative for chest pain, palpitations and leg swelling.  Gastrointestinal:  Negative for abdominal pain, constipation and diarrhea.  Musculoskeletal:  Positive for arthralgias and back pain.  Neurological:  Negative for dizziness, weakness, light-headedness and headaches.  Psychiatric/Behavioral:  Negative for dysphoric mood and sleep disturbance. The patient is not nervous/anxious.      Lab Results  Component Value Date   NA 140 09/03/2022   K 4.4 09/03/2022   CO2 23 09/03/2022   GLUCOSE 106 (H) 09/03/2022   BUN 14 09/03/2022   CREATININE 0.96 09/03/2022   CALCIUM 10.0 09/03/2022   EGFR 61 09/03/2022   GFRNONAA 65 02/08/2020   Lab Results  Component Value Date   CHOL 152 09/03/2022   HDL 49 09/03/2022   LDLCALC 81 09/03/2022   TRIG 122 09/03/2022   CHOLHDL 3.1 09/03/2022   Lab Results  Component Value Date   TSH 2.350 09/03/2022   Lab Results  Component Value Date   HGBA1C 5.4 09/03/2022   Lab Results  Component Value Date   WBC 5.0 09/03/2022   HGB 14.1 09/03/2022   HCT 42.5 09/03/2022   MCV 90 09/03/2022   PLT 191 09/03/2022   Lab Results  Component Value Date   ALT 19 09/03/2022   AST 22 09/03/2022    ALKPHOS 72 09/03/2022   BILITOT 0.5 09/03/2022   Lab Results  Component Value Date   VD25OH 36.4 08/28/2020     Patient Active Problem List   Diagnosis Date Noted   Lung nodule, solitary 03/05/2023   Atherosclerosis of native coronary artery of native heart without angina pectoris 03/11/2021   Prediabetes 02/09/2020   Centrilobular emphysema (HCC) 10/27/2019   Aortic atherosclerosis (HCC) 03/08/2019   Osteopenia determined by x-ray 08/13/2017   Alopecia areata 07/23/2016   Heart palpitations 07/23/2016   Xerosis cutis 04/11/2015   Mixed hyperlipidemia 03/09/2015   Tobacco use disorder, moderate, in sustained remission 03/09/2015   History of vertebral compression fracture 03/09/2015   History of colon polyps 03/09/2015   Essential hypertension 03/09/2015   Microscopic hematuria 03/09/2015    Allergies  Allergen Reactions   Bupropion Other (See Comments)    Hair loss    Past Surgical History:  Procedure Laterality Date   AUGMENTATION MAMMAPLASTY Bilateral 1988   BREAST ENHANCEMENT SURGERY     BUNIONECTOMY Bilateral    CERVICAL CONE BIOPSY  1971   LSIL   EYE SURGERY  Lens replacement   facet shots in back  05/2016   [facility]-    TUBAL LIGATION      Social History   Tobacco Use   Smoking status: Former    Current packs/day: 0.00    Average packs/day: 0.8 packs/day for 60.0 years (45.0 ttl pk-yrs)    Types:  Cigarettes    Start date: 12/20/1957    Quit date: 12/20/2017    Years since quitting: 5.2   Smokeless tobacco: Never  Vaping Use   Vaping status: Never Used  Substance Use Topics   Alcohol use: Yes    Comment: 2 drinks or less/month   Drug use: No     Medication list has been reviewed and updated.  Current Meds  Medication Sig   aspirin 81 MG EC tablet Take 1 tablet (81 mg total) by mouth daily. Swallow whole.   atorvastatin (LIPITOR) 10 MG tablet TAKE 1 TABLET(10 MG) BY MOUTH DAILY AT 6 PM   Calcium Carbonate+Vitamin D 600-200 MG-UNIT TABS  Take 1 tablet by mouth daily.   Fluocinolone Acetonide 0.01 % OIL fluocinolone acetonide oil 0.01 % ear drops  APPLY TO BOTH EARS TWICE DAILY AS NEEDED   losartan (COZAAR) 50 MG tablet TAKE 1 TABLET(50 MG) BY MOUTH DAILY   magnesium hydroxide (PHILLIPS CHEWS) 311 MG CHEW chewable tablet Chew 311 mg by mouth every 4 (four) hours as needed.   metoprolol succinate (TOPROL-XL) 25 MG 24 hr tablet Take 25 mg by mouth daily. Take 1/2 tablet daily   Multiple Vitamins-Minerals (MULTIVITAMIN WOMENS 50+ ADV PO) Take by mouth.       03/05/2023    8:14 AM 09/03/2022    8:03 AM 03/04/2022    8:26 AM 09/02/2021    8:44 AM  GAD 7 : Generalized Anxiety Score  Nervous, Anxious, on Edge 0 0 0 0  Control/stop worrying 0 0 0 0  Worry too much - different things 0 0 0 0  Trouble relaxing 0 0 0 0  Restless 0 0 0 0  Easily annoyed or irritable 0 0 0 0  Afraid - awful might happen 0 0 0 0  Total GAD 7 Score 0 0 0 0  Anxiety Difficulty Not difficult at all Not difficult at all Not difficult at all Not difficult at all       03/05/2023    8:14 AM 09/03/2022    8:03 AM 08/27/2022   10:55 AM  Depression screen PHQ 2/9  Decreased Interest 0 0 0  Down, Depressed, Hopeless 0 0 0  PHQ - 2 Score 0 0 0  Altered sleeping 3 3 0  Tired, decreased energy 0 0 0  Change in appetite 1 2 0  Feeling bad or failure about yourself  0 0 0  Trouble concentrating 0 0 0  Moving slowly or fidgety/restless 0 0 0  Suicidal thoughts 0 0 0  PHQ-9 Score 4 5 0  Difficult doing work/chores Not difficult at all Not difficult at all Not difficult at all    BP Readings from Last 3 Encounters:  03/05/23 126/62  09/03/22 128/68  08/27/22 (!) 150/72    Physical Exam Vitals and nursing note reviewed.  Constitutional:      General: She is not in acute distress.    Appearance: She is well-developed.  HENT:     Head: Normocephalic and atraumatic.  Neck:     Vascular: No carotid bruit.  Cardiovascular:     Rate and Rhythm: Regular  rhythm. Bradycardia present.  Pulmonary:     Effort: Pulmonary effort is normal. No respiratory distress.     Breath sounds: No wheezing or rhonchi.  Musculoskeletal:     Cervical back: Normal range of motion.     Right lower leg: No edema.     Left lower leg: No edema.  Lymphadenopathy:  Cervical: No cervical adenopathy.  Skin:    General: Skin is warm and dry.     Findings: No rash.  Neurological:     Mental Status: She is alert and oriented to person, place, and time.  Psychiatric:        Mood and Affect: Mood normal.        Behavior: Behavior normal.     Wt Readings from Last 3 Encounters:  03/05/23 176 lb (79.8 kg)  09/03/22 173 lb (78.5 kg)  08/27/22 174 lb 6.4 oz (79.1 kg)    BP 126/62   Pulse (!) 55   Ht 5\' 6"  (1.676 m)   Wt 176 lb (79.8 kg)   SpO2 100%   BMI 28.41 kg/m   Assessment and Plan:  Problem List Items Addressed This Visit       Unprioritized   Essential hypertension - Primary    Controlled BP with normal exam. Current regimen is losartan and metoprolol. Renal function is normal.  Will continue same medications; encourage continued reduced sodium diet.       Lung nodule, solitary    2.2 mm nodule in LUL Will repeat CT scan      Relevant Orders   CT CHEST LUNG CA SCREEN LOW DOSE W/O CM    Return in about 6 months (around 09/03/2023) for CPX.    Reubin Milan, MD [facility] [place]

## 2023-03-05 NOTE — Assessment & Plan Note (Addendum)
2.2 mm nodule in LUL Will repeat CT scan

## 2023-03-12 ENCOUNTER — Ambulatory Visit
Admission: RE | Admit: 2023-03-12 | Discharge: 2023-03-12 | Disposition: A | Discharge status: Home / Self Care | Payer: Medicare Other | Source: Ambulatory Visit | Attending: Internal Medicine | Admitting: Internal Medicine

## 2023-03-12 DIAGNOSIS — Z9882 Breast implant status: Secondary | ICD-10-CM | POA: Diagnosis not present

## 2023-03-12 DIAGNOSIS — Z87891 Personal history of nicotine dependence: Secondary | ICD-10-CM | POA: Insufficient documentation

## 2023-03-12 DIAGNOSIS — Z9189 Other specified personal risk factors, not elsewhere classified: Secondary | ICD-10-CM | POA: Insufficient documentation

## 2023-03-12 DIAGNOSIS — I2510 Atherosclerotic heart disease of native coronary artery without angina pectoris: Secondary | ICD-10-CM | POA: Insufficient documentation

## 2023-03-12 DIAGNOSIS — R911 Solitary pulmonary nodule: Secondary | ICD-10-CM | POA: Insufficient documentation

## 2023-03-12 DIAGNOSIS — J432 Centrilobular emphysema: Secondary | ICD-10-CM | POA: Insufficient documentation

## 2023-03-12 DIAGNOSIS — Z122 Encounter for screening for malignant neoplasm of respiratory organs: Secondary | ICD-10-CM | POA: Insufficient documentation

## 2023-03-12 DIAGNOSIS — I700 Atherosclerosis of aorta: Secondary | ICD-10-CM | POA: Diagnosis not present

## 2023-03-19 ENCOUNTER — Telehealth: Payer: Self-pay | Admitting: Internal Medicine

## 2023-03-19 NOTE — Telephone Encounter (Signed)
They are not back yet but we will call her when they come in.  - Melinda Perry

## 2023-03-19 NOTE — Telephone Encounter (Signed)
Copied from CRM [id number]. Topic: General - Inquiry >> Mar 19, 2023 10:07 AM Shon Hale wrote: Reason for CRM: Pt calling to check on CT results. Pt requesting call as soon as they are resulted.

## 2023-03-30 DIAGNOSIS — H35371 Puckering of macula, right eye: Secondary | ICD-10-CM | POA: Diagnosis not present

## 2023-03-30 DIAGNOSIS — H04123 Dry eye syndrome of bilateral lacrimal glands: Secondary | ICD-10-CM | POA: Diagnosis not present

## 2023-03-30 DIAGNOSIS — H40053 Ocular hypertension, bilateral: Secondary | ICD-10-CM | POA: Diagnosis not present

## 2023-03-30 DIAGNOSIS — Z961 Presence of intraocular lens: Secondary | ICD-10-CM | POA: Diagnosis not present

## 2023-04-26 ENCOUNTER — Other Ambulatory Visit: Payer: Self-pay | Admitting: Internal Medicine

## 2023-04-26 DIAGNOSIS — I10 Essential (primary) hypertension: Secondary | ICD-10-CM

## 2023-04-28 NOTE — Telephone Encounter (Signed)
Requested Prescriptions  Pending Prescriptions Disp Refills   losartan (COZAAR) 50 MG tablet [Pharmacy Med Name: LOSARTAN 50MG  TABLETS] 90 tablet 1    Sig: TAKE 1 TABLET(50 MG) BY MOUTH DAILY     Cardiovascular:  Angiotensin Receptor Blockers Failed - 04/28/2023  1:14 PM      Failed - Cr in normal range and within 180 days    Creatinine, Ser  Date Value Ref Range Status  09/03/2022 0.96 0.57 - 1.00 mg/dL Final         Failed - K in normal range and within 180 days    Potassium  Date Value Ref Range Status  09/03/2022 4.4 3.5 - 5.2 mmol/L Final         Passed - Patient is not pregnant      Passed - Last BP in normal range    BP Readings from Last 1 Encounters:  03/05/23 126/62         Passed - Valid encounter within last 6 months    Recent Outpatient Visits           1 month ago Essential hypertension   [place] [facility] at [facility], Nyoka Cowden, MD   7 months ago Annual physical exam   [facility] at [facility], Nyoka Cowden, MD   1 year ago Benign hypertension   [place] [facility] at [facility], Nyoka Cowden, MD   1 year ago Annual physical exam   [facility] at [facility], Nyoka Cowden, MD   2 years ago Benign hypertension    [facility] & Sports Medicine at [facility], Nyoka Cowden, MD       Future Appointments             In 4 months Judithann Graves, Nyoka Cowden, MD [facility] at [facility], [facility]

## 2023-05-18 ENCOUNTER — Other Ambulatory Visit: Payer: Self-pay | Admitting: Internal Medicine

## 2023-05-18 DIAGNOSIS — E782 Mixed hyperlipidemia: Secondary | ICD-10-CM

## 2023-05-19 NOTE — Telephone Encounter (Signed)
Requested Prescriptions  Refused Prescriptions Disp Refills   atorvastatin (LIPITOR) 10 MG tablet [Pharmacy Med Name: ATORVASTATIN 10MG  TABLETS] 90 tablet 3    Sig: TAKE 1 TABLET(10 MG) BY MOUTH DAILY AT 6 PM     Cardiovascular:  Antilipid - Statins Failed - 05/19/2023  9:26 AM      Failed - Lipid Panel in normal range within the last 12 months    Cholesterol, Total  Date Value Ref Range Status  09/03/2022 152 100 - 199 mg/dL Final   LDL Chol Calc (NIH)  Date Value Ref Range Status  09/03/2022 81 0 - 99 mg/dL Final   HDL  Date Value Ref Range Status  09/03/2022 49 >39 mg/dL Final   Triglycerides  Date Value Ref Range Status  09/03/2022 122 0 - 149 mg/dL Final         Passed - Patient is not pregnant      Passed - Valid encounter within last 12 months    Recent Outpatient Visits           2 months ago Essential hypertension   [place] [facility] at [facility], Nyoka Cowden, MD   8 months ago Annual physical exam   [facility] at [facility], Nyoka Cowden, MD   1 year ago Benign hypertension   [place] [facility] at [facility], Nyoka Cowden, MD   1 year ago Annual physical exam   [facility] Medicine at [facility], Nyoka Cowden, MD   2 years ago Benign hypertension   [place] [facility] Sports Medicine at [facility], Nyoka Cowden, MD       Future Appointments             In 3 months Judithann Graves, Nyoka Cowden, MD [facility] Medicine at [facility], [facility]

## 2023-08-04 DIAGNOSIS — H40053 Ocular hypertension, bilateral: Secondary | ICD-10-CM | POA: Diagnosis not present

## 2023-08-04 DIAGNOSIS — Z961 Presence of intraocular lens: Secondary | ICD-10-CM | POA: Diagnosis not present

## 2023-08-04 DIAGNOSIS — H04123 Dry eye syndrome of bilateral lacrimal glands: Secondary | ICD-10-CM | POA: Diagnosis not present

## 2023-08-04 DIAGNOSIS — H35371 Puckering of macula, right eye: Secondary | ICD-10-CM | POA: Diagnosis not present

## 2023-08-12 DIAGNOSIS — R001 Bradycardia, unspecified: Secondary | ICD-10-CM | POA: Diagnosis not present

## 2023-08-12 DIAGNOSIS — I10 Essential (primary) hypertension: Secondary | ICD-10-CM | POA: Diagnosis not present

## 2023-08-12 DIAGNOSIS — I493 Ventricular premature depolarization: Secondary | ICD-10-CM | POA: Diagnosis not present

## 2023-08-12 DIAGNOSIS — R002 Palpitations: Secondary | ICD-10-CM | POA: Diagnosis not present

## 2023-08-12 DIAGNOSIS — I2510 Atherosclerotic heart disease of native coronary artery without angina pectoris: Secondary | ICD-10-CM | POA: Diagnosis not present

## 2023-08-13 ENCOUNTER — Other Ambulatory Visit: Payer: Self-pay | Admitting: Internal Medicine

## 2023-08-13 DIAGNOSIS — E782 Mixed hyperlipidemia: Secondary | ICD-10-CM

## 2023-08-13 NOTE — Telephone Encounter (Signed)
 Copied from CRM [id number]. Topic: Clinical - Medication Refill >> Aug 13, 2023  4:57 PM Fredrica W wrote: Medication: atorvastatin  (LIPITOR) 10 MG tablet  Has the patient contacted their pharmacy? Yes (Agent: If no, request that the patient contact the pharmacy for the refill. If patient does not wish to contact the pharmacy document the reason why and proceed with request.) (Agent: If yes, when and what did the pharmacy advise?) No longer listed on refills   This is the patient's preferred pharmacy:  [id number] [place] - [address] AT [facility] OF [address] [place] [place] [address] Phone: [phone] Fax: [phone]  Is this the correct pharmacy for this prescription? Yes If no, delete pharmacy and type the correct one.   Has the prescription been filled recently? No  Is the patient out of the medication? No  Has the patient been seen for an appointment in the last year OR does the patient have an upcoming appointment? Yes  Can we respond through MyChart? Yes  Agent: Please be advised that Rx refills may take up to 3 business days. We ask that you follow-up with your pharmacy.

## 2023-08-17 MED ORDER — ATORVASTATIN CALCIUM 10 MG PO TABS: ORAL_TABLET | ORAL | 0 refills | Status: DC

## 2023-08-17 NOTE — Telephone Encounter (Signed)
 Requested Prescriptions  Pending Prescriptions Disp Refills   atorvastatin  (LIPITOR) 10 MG tablet 90 tablet 0    Sig: TAKE 1 TABLET(10 MG) BY MOUTH DAILY AT 6 PM     Cardiovascular:  Antilipid - Statins Failed - 08/17/2023 12:03 PM      Failed - Valid encounter within last 12 months    Recent Outpatient Visits   None     Future Appointments             In 3 weeks Sheron Dixons, MD [facility] Primary Care & Sports Medicine at [facility], [facility]            Failed - Lipid Panel in normal range within the last 12 months    Cholesterol, Total  Date Value Ref Range Status  09/03/2022 152 100 - 199 mg/dL Final   LDL Chol Calc (NIH)  Date Value Ref Range Status  09/03/2022 81 0 - 99 mg/dL Final   HDL  Date Value Ref Range Status  09/03/2022 49 >39 mg/dL Final   Triglycerides  Date Value Ref Range Status  09/03/2022 122 0 - 149 mg/dL Final         Passed - Patient is not pregnant

## 2023-08-17 NOTE — Telephone Encounter (Signed)
 Pt returned call regarding refill. Says she is almost out.

## 2023-09-02 ENCOUNTER — Other Ambulatory Visit: Payer: Self-pay | Admitting: Internal Medicine

## 2023-09-02 ENCOUNTER — Ambulatory Visit: Payer: Medicare Other | Admitting: Emergency Medicine

## 2023-09-02 VITALS — Ht 65.5 in | Wt 168.0 lb

## 2023-09-02 DIAGNOSIS — Z0000 Encounter for general adult medical examination without abnormal findings: Secondary | ICD-10-CM | POA: Diagnosis not present

## 2023-09-02 DIAGNOSIS — Z1231 Encounter for screening mammogram for malignant neoplasm of breast: Secondary | ICD-10-CM

## 2023-09-02 NOTE — Patient Instructions (Signed)
 [name] , Thank you for taking time out of your busy schedule to complete your Annual Wellness Visit with me. I enjoyed our conversation and look forward to speaking with you again next year. I, as well as your care team,  appreciate your ongoing commitment to your health goals. Please review the following plan we discussed and let me know if I can assist you in the future. Your Game plan/ To Do List    Referrals: None  Follow up Visits: Next Medicare AWV with our clinical staff: 09/07/24 @ 10:50am (phone visit)   Have you seen your provider in the last 6 months (3 months if uncontrolled diabetes)? Yes Next Office Visit with your provider: 09/07/23 @ 11:00am with Dr. Gala Perry  Clinician Recommendations: Keep up the good work!  Aim for 30 minutes of exercise or brisk walking, 6-8 glasses of water, and 5 servings of fruits and vegetables each day.       This is a list of the screening recommended for you and due dates:  Health Maintenance  Topic Date Due   COVID-19 Vaccine (12 - 2024-25 season) 06/06/2023   Mammogram  09/25/2023   Flu Shot  10/30/2023   Screening for Lung Cancer  03/11/2024   Medicare Annual Wellness Visit  09/01/2024   DEXA scan (bone density measurement)  09/18/2024   DTaP/Tdap/Td vaccine (4 - Td or Tdap) 08/29/2030   Pneumonia Vaccine  Completed   Zoster (Shingles) Vaccine  Completed   Hepatitis C Screening  Addressed   HPV Vaccine  Aged Out   Meningitis B Vaccine  Aged Out   Colon Cancer Screening  Discontinued    Advanced directives: (In Chart) A copy of your advanced directives are scanned into your chart should your provider ever need it. Advance Care Planning is important because it:  [x]  Makes sure you receive the medical care that is consistent with your values, goals, and preferences  [x]  It provides guidance to your family and loved ones and reduces their decisional burden about whether or not they are making the right decisions based on your  wishes.  Follow the link provided in your after visit summary or read over the paperwork we have mailed to you to help you started getting your Advance Directives in place. If you need assistance in completing these, please reach out to us  so that we can help you!  See attachments for Preventive Care and Fall Prevention Tips.   Fall Prevention in the Home, Adult Falls can cause injuries and affect people of all ages. There are many simple things that you can do to make your home safe and to help prevent falls. If you need it, ask for help making these changes. What actions can I take to prevent falls? General information Use good lighting in all rooms. Make sure to: Replace any light bulbs that burn out. Turn on lights if it is dark and use night-lights. Keep items that you use often in easy-to-reach places. Lower the shelves around your home if needed. Move furniture so that there are clear paths around it. Do not keep throw rugs or other things on the floor that can make you trip. If any of your floors are uneven, fix them. Add color or contrast paint or tape to clearly mark and help you see: Grab bars or handrails. First and last steps of staircases. Where the edge of each step is. If you use a ladder or stepladder: Make sure that it is fully opened. Do  not climb a closed ladder. Make sure the sides of the ladder are locked in place. Have someone hold the ladder while you use it. Know where your pets are as you move through your home. What can I do in the bathroom?     Keep the floor dry. Clean up any water that is on the floor right away. Remove soap buildup in the bathtub or shower. Buildup makes bathtubs and showers slippery. Use non-skid mats or decals on the floor of the bathtub or shower. Attach bath mats securely with double-sided, non-slip rug tape. If you need to sit down while you are in the shower, use a non-slip stool. Install grab bars by the toilet and in the  bathtub and shower. Do not use towel bars as grab bars. What can I do in the bedroom? Make sure that you have a light by your bed that is easy to reach. Do not use any sheets or blankets on your bed that hang to the floor. Have a firm bench or chair with side arms that you can use for support when you get dressed. What can I do in the kitchen? Clean up any spills right away. If you need to reach something above you, use a sturdy step stool that has a grab bar. Keep electrical cables out of the way. Do not use floor polish or wax that makes floors slippery. What can I do with my stairs? Do not leave anything on the stairs. Make sure that you have a light switch at the top and the bottom of the stairs. Have them installed if you do not have them. Make sure that there are handrails on both sides of the stairs. Fix handrails that are broken or loose. Make sure that handrails are as long as the staircases. Install non-slip stair treads on all stairs in your home if they do not have carpet. Avoid having throw rugs at the top or bottom of stairs, or secure the rugs with carpet tape to prevent them from moving. Choose a carpet design that does not hide the edge of steps on the stairs. Make sure that carpet is firmly attached to the stairs. Fix any carpet that is loose or worn. What can I do on the outside of my home? Use bright outdoor lighting. Repair the edges of walkways and driveways and fix any cracks. Clear paths of anything that can make you trip, such as tools or rocks. Add color or contrast paint or tape to clearly mark and help you see high doorway thresholds. Trim any bushes or trees on the main path into your home. Check that handrails are securely fastened and in good repair. Both sides of all steps should have handrails. Install guardrails along the edges of any raised decks or porches. Have leaves, snow, and ice cleared regularly. Use sand, salt, or ice melt on walkways during winter  months if you live where there is ice and snow. In the garage, clean up any spills right away, including grease or oil spills. What other actions can I take? Review your medicines with your health care provider. Some medicines can make you confused or feel dizzy. This can increase your chance of falling. Wear closed-toe shoes that fit well and support your feet. Wear shoes that have rubber soles and low heels. Use a cane, walker, scooter, or crutches that help you move around if needed. Talk with your provider about other ways that you can decrease your risk of falls. This may  include seeing a physical therapist to learn to do exercises to improve movement and strength. Where to find more information Centers for Disease Control and Prevention, [employer]: TonerPromos.no [employer] on Aging: BaseRingTones.pl National Institute on Aging: BaseRingTones.pl Contact a health care provider if: You are afraid of falling at home. You feel weak, drowsy, or dizzy at home. You fall at home. Get help right away if you: Lose consciousness or have trouble moving after a fall. Have a fall that causes a head injury. These symptoms may be an emergency. Get help right away. Call 911. Do not wait to see if the symptoms will go away. Do not drive yourself to the hospital. This information is not intended to replace advice given to you by your health care provider. Make sure you discuss any questions you have with your health care provider. Document Revised: 11/18/2021 Document Reviewed: 11/18/2021 Elsevier Patient Education  2024 [employer].

## 2023-09-02 NOTE — Progress Notes (Signed)
 Subjective:   Melinda Perry is a 78 y.o. who presents for a Medicare Wellness preventive visit.  As a reminder, Annual Wellness Visits don't include a physical exam, and some assessments may be limited, especially if this visit is performed virtually. We may recommend an in-person follow-up visit with your provider if needed.  Visit Complete: Virtual I connected with  Melinda Perry on 09/02/23 by a audio enabled telemedicine application and verified that I am speaking with the correct person using two identifiers.  Patient Location: Home  Provider Location: Home Office  I discussed the limitations of evaluation and management by telemedicine. The patient expressed understanding and agreed to proceed.  Vital Signs: Because this visit was a virtual/telehealth visit, some criteria may be missing or patient reported. Any vitals not documented were not able to be obtained and vitals that have been documented are patient reported.  VideoDeclined- This patient declined Librarian, academic. Therefore the visit was completed with audio only.  Persons Participating in Visit: Patient.  AWV Questionnaire: Yes: Patient Medicare AWV questionnaire was completed by the patient on 08/29/23; I have confirmed that all information answered by patient is correct and no changes since this date.  Cardiac Risk Factors include: advanced age (>41men, >9 women);hypertension;dyslipidemia     Objective:     Today's Vitals   09/02/23 1132  Weight: 168 lb (76.2 kg)  Height: 5' 5.5" (1.664 m)   Body mass index is 27.53 kg/m.     09/02/2023   11:55 AM 08/27/2022   10:57 AM 09/27/2021    7:11 PM 08/19/2021   10:09 AM 08/15/2020   10:18 AM 05/30/2019    8:12 PM 08/09/2018   10:14 AM  Advanced Directives  Does Patient Have a Medical Advance Directive? Yes Yes Yes Yes Yes No Yes  Type of Estate agent of [place];Living will Healthcare Power of  [place];Living will Healthcare Power of [place];Living will Healthcare Power of [place];Living will Healthcare Power of [place];Living will  Living will;Healthcare Power of Attorney  Does patient want to make changes to medical advance directive? No - Patient declined Yes (Inpatient - patient defers changing a medical advance directive and declines information at this time)       Copy of Healthcare Power of Attorney in Chart? Yes - validated most recent copy scanned in chart (See row information) Yes - validated most recent copy scanned in chart (See row information)  Yes - validated most recent copy scanned in chart (See row information) Yes - validated most recent copy scanned in chart (See row information)  Yes - validated most recent copy scanned in chart (See row information)  Would patient like information on creating a medical advance directive?  No - Patient declined;Yes (Inpatient - patient defers creating a medical advance directive and declines information at this time)         Current Medications (verified) Outpatient Encounter Medications as of 09/02/2023  Medication Sig   aspirin  81 MG EC tablet Take 1 tablet (81 mg total) by mouth daily. Swallow whole.   atorvastatin  (LIPITOR) 10 MG tablet TAKE 1 TABLET(10 MG) BY MOUTH DAILY AT 6 PM   Calcium  Citrate-Vitamin D3 (CALCIUM  CITRATE + D3) 315-6.25 MG-MCG TABS Take 1 tablet by mouth daily.   Fluocinolone  Acetonide 0.01 % OIL fluocinolone  acetonide oil 0.01 % ear drops  APPLY TO BOTH EARS TWICE DAILY AS NEEDED   losartan  (COZAAR ) 50 MG tablet TAKE 1 TABLET(50 MG) BY MOUTH DAILY  metoprolol  succinate (TOPROL -XL) 25 MG 24 hr tablet Take 25 mg by mouth daily. Take 1/2 tablet daily   Multiple Vitamins-Minerals (MULTIVITAMIN WOMENS 50+ ADV PO) Take by mouth.   Omega-3 Fatty Acids (FISH OIL MINIS) 500 MG CAPS Take 1 tablet by mouth daily.   [DISCONTINUED] Calcium  Carbonate+Vitamin D 600-200 MG-UNIT TABS Take 1 tablet by mouth daily.    magnesium hydroxide (PHILLIPS CHEWS) 311 MG CHEW chewable tablet Chew 311 mg by mouth every 4 (four) hours as needed. (Patient not taking: Reported on 09/02/2023)   [DISCONTINUED] omeprazole (PRILOSEC) 10 MG capsule Take 10 mg by mouth daily. TEMPERARY- OTC for Keto Diet   No facility-administered encounter medications on file as of 09/02/2023.    Allergies (verified) Bupropion   History: Past Medical History:  Diagnosis Date   Ankle pain 04/11/2015   Cancer (HCC)    skin ca -squamous cell on face   Centrilobular emphysema (HCC) 10/27/2019   High cholesterol    Hypertension    PVC (premature ventricular contraction)    Past Surgical History:  Procedure Laterality Date   AUGMENTATION MAMMAPLASTY Bilateral 1988   BREAST ENHANCEMENT SURGERY     BUNIONECTOMY Bilateral    CERVICAL CONE BIOPSY  1971   LSIL   EYE SURGERY  Lens replacement   facet shots in back  05/2016   [facility]-    TUBAL LIGATION     Family History  Problem Relation Age of Onset   Lung cancer Mother    Alcohol abuse Mother    Cancer Mother    Pulmonary fibrosis Father    Alcohol abuse Sister    Breast cancer Neg Hx    Social History   Socioeconomic History   Marital status: Divorced    Spouse name: Not on file   Number of children: 2   Years of education: some college   Highest education level: Some college, no degree  Occupational History   Occupation: Retired  Tobacco Use   Smoking status: Former    Current packs/day: 0.00    Average packs/day: 0.8 packs/day for 60.0 years (45.0 ttl pk-yrs)    Types: Cigarettes    Start date: 12/20/1957    Quit date: 12/20/2017    Years since quitting: 5.7    Passive exposure: Past   Smokeless tobacco: Never  Vaping Use   Vaping status: Never Used  Substance and Sexual Activity   Alcohol use: Yes    Comment: 1 drink monthly or less   Drug use: No   Sexual activity: Not Currently    Birth control/protection: None  Other Topics Concern   Not on file   Social History Narrative   Pt lives alone   Social Drivers of Health   Financial Resource Strain: Low Risk  (09/02/2023)   Overall Financial Resource Strain (CARDIA)    Difficulty of Paying Living Expenses: Not hard at all  Food Insecurity: No Food Insecurity (09/02/2023)   Hunger Vital Sign    Worried About Running Out of Food in the Last Year: Never true    Ran Out of Food in the Last Year: Never true  Transportation Needs: No Transportation Needs (09/02/2023)   PRAPARE - Administrator, Civil Service (Medical): No    Lack of Transportation (Non-Medical): No  Physical Activity: Sufficiently Active (09/02/2023)   Exercise Vital Sign    Days of Exercise per Week: 5 days    Minutes of Exercise per Session: 40 min  Stress: No Stress Concern Present (  09/02/2023)   [employer] [employer] - Occupational Stress Questionnaire    Feeling of Stress : Only a little  Social Connections: Moderately Isolated (09/02/2023)   Social Connection and Isolation Panel [NHANES]    Frequency of Communication with Friends and Family: More than three times a week    Frequency of Social Gatherings with Friends and Family: More than three times a week    Attends Religious Services: Never    Database administrator or Organizations: Yes    Attends Engineer, structural: More than 4 times per year    Marital Status: Divorced    Tobacco Counseling Counseling given: No    Clinical Intake:  Pre-visit preparation completed: Yes  Pain : No/denies pain     BMI - recorded: 27.53 Nutritional Status: BMI 25 -29 Overweight Nutritional Risks: None Diabetes: No  Lab Results  Component Value Date   HGBA1C 5.4 09/03/2022   HGBA1C 5.5 09/02/2021   HGBA1C 5.1 03/11/2021     How often do you need to have someone help you when you read instructions, pamphlets, or other written materials from your doctor or pharmacy?: 1 - Never  Interpreter Needed?: No  Information entered by  :: Jaunita Messier, CMA   Activities of Daily Living     09/02/2023   11:34 AM 08/29/2023    9:15 AM  In your present state of health, do you have any difficulty performing the following activities:  Hearing? 1 0  Comment wears hearing aids   Vision? 0 0  Difficulty concentrating or making decisions? 0 0  Walking or climbing stairs? 0 0  Dressing or bathing? 0 0  Doing errands, shopping? 0 0  Preparing Food and eating ? N N  Using the Toilet? N N  In the past six months, have you accidently leaked urine? Y Y  Comment minimal   Do you have problems with loss of bowel control? N N  Managing your Medications? N N  Managing your Finances? N N  Housekeeping or managing your Housekeeping? N N    Patient Care Team: Sheron Dixons, MD as PCP - General (Internal Medicine) Percival Brace, MD as Consulting Physician (Cardiology) Long, Sophia Dustman, PA-C (Dermatology) Barton Like, PA-C (Cardiology) [facility], [facility] (Orthopedic Surgery)  I have updated your Care Teams any recent Medical Services you may have received from other providers in the past year.     Assessment:    This is a routine wellness examination for [place].  Hearing/Vision screen Hearing Screening - Comments:: Wears hearing aids Vision Screening - Comments:: Gets routine eye exams, Dr. Phillips Bread, [place], [place]   Goals Addressed             This Visit's Progress    Increase physical activity       COMPLETED: Weight < 200 lb (90.7 kg)   168 lb (76.2 kg)      Depression Screen     09/02/2023   11:51 AM 03/05/2023    8:14 AM 09/03/2022    8:03 AM 08/27/2022   10:55 AM 03/04/2022    8:26 AM 09/02/2021    8:43 AM 08/19/2021   10:08 AM  PHQ 2/9 Scores  PHQ - 2 Score 0 0 0 0 0 0 0  PHQ- 9 Score 1 4 5  0 2 1     Fall Risk     09/02/2023   11:56 AM 08/29/2023    9:15 AM 03/05/2023    8:14 AM 09/03/2022  8:03 AM 08/27/2022   10:58 AM  Fall Risk   Falls in the past year? 0 0 0 1 0  Number falls in past yr: 0 0 0 0  0  Injury with Fall? 0 0 0 1 0  Risk for fall due to : No Fall Risks  No Fall Risks No Fall Risks History of fall(s)  Follow up Falls evaluation completed  Falls evaluation completed Falls evaluation completed Falls prevention discussed;Falls evaluation completed    MEDICARE RISK AT HOME:  Medicare Risk at Home Any stairs in or around the home?: Yes If so, are there any without handrails?: No Home free of loose throw rugs in walkways, pet beds, electrical cords, etc?: Yes Adequate lighting in your home to reduce risk of falls?: Yes Life alert?: No Use of a cane, walker or w/c?: Yes Grab bars in the bathroom?: Yes Shower chair or bench in shower?: Yes Elevated toilet seat or a handicapped toilet?: No  TIMED UP AND GO:  Was the test performed?  No  Cognitive Function: 6CIT completed        09/02/2023   11:57 AM 08/27/2022   11:08 AM 08/09/2018   10:17 AM 08/05/2017   10:14 AM 07/23/2016    9:33 AM  6CIT Screen  What Year? 0 points 0 points 0 points 0 points 0 points  What month? 0 points 0 points 0 points 0 points 0 points  What time? 0 points 0 points 0 points 0 points 0 points  Count back from 20 0 points 0 points 0 points 0 points 0 points  Months in reverse 0 points 0 points 0 points 0 points 0 points  Repeat phrase 0 points 0 points 0 points 0 points 0 points  Total Score 0 points 0 points 0 points 0 points 0 points    Immunizations Immunization History  Administered Date(s) Administered   Fluad Quad(high Dose 65+) 12/16/2018   Influenza Split 01/29/2009, 03/31/2009, 01/07/2011   Influenza, High Dose Seasonal PF 01/06/2017   Influenza-Unspecified 01/13/2016, 12/16/2019, 01/29/2021, 12/11/2021, 11/22/2022   PFIZER Comirnaty(Gray Top)Covid-19 Tri-Sucrose Vaccine 04/20/2019, 05/14/2019, 12/24/2019, 07/09/2020   PFIZER(Purple Top)SARS-COV-2 Vaccination 04/20/2019, 05/14/2019, 12/24/2019, 07/09/2020, 07/31/2021   PNEUMOCOCCAL CONJUGATE-20 03/04/2022   Pfizer Covid-19  Vaccine Bivalent Booster 25yrs & up 12/15/2020   Pfizer Fall 2024 Covid-19 Vaccine 45yrs thru 65yrs. 12/25/2021   Pfizer(Comirnaty)Fall Seasonal Vaccine 12 years and older 12/07/2022   Pneumococcal Conjugate-13 11/16/2013   Pneumococcal Polysaccharide-23 07/29/2009, 04/01/2010   Respiratory Syncytial Virus Vaccine,Recomb Aduvanted(Arexvy) 04/22/2022   Tdap 04/01/2010, 01/07/2011, 08/28/2020   Zoster Recombinant(Shingrix) 07/24/2016, 11/24/2016   Zoster, Live 04/01/2010    Screening Tests Health Maintenance  Topic Date Due   COVID-19 Vaccine (12 - 2024-25 season) 06/06/2023   MAMMOGRAM  09/25/2023   INFLUENZA VACCINE  10/30/2023   Lung Cancer Screening  03/11/2024   Medicare Annual Wellness (AWV)  09/01/2024   DEXA SCAN  09/18/2024   DTaP/Tdap/Td (4 - Td or Tdap) 08/29/2030   Pneumonia Vaccine 55+ Years old  Completed   Zoster Vaccines- Shingrix  Completed   Hepatitis C Screening  Addressed   HPV VACCINES  Aged Out   Meningococcal B Vaccine  Aged Out   Colonoscopy  Discontinued    Health Maintenance  Health Maintenance Due  Topic Date Due   COVID-19 Vaccine (12 - 2024-25 season) 06/06/2023   Health Maintenance Items Addressed: See Nurse Notes at the end of this note  Additional Screening:  Vision Screening: Recommended annual ophthalmology exams for  early detection of glaucoma and other disorders of the eye. Would you like a referral to an eye doctor? No    Dental Screening: Recommended annual dental exams for proper oral hygiene  Community Resource Referral / Chronic Care Management: CRR required this visit?  No   CCM required this visit?  No   Plan:    I have personally reviewed and noted the following in the patient's chart:   Medical and social history Use of alcohol, tobacco or illicit drugs  Current medications and supplements including opioid prescriptions. Patient is not currently taking opioid prescriptions. Functional ability and status Nutritional  status Physical activity Advanced directives List of other physicians Hospitalizations, surgeries, and ER visits in previous 12 months Vitals Screenings to include cognitive, depression, and falls Referrals and appointments  In addition, I have reviewed and discussed with patient certain preventive protocols, quality metrics, and best practice recommendations. A written personalized care plan for preventive services as well as general preventive health recommendations were provided to patient.   Jaunita Messier, CMA   09/02/2023   After Visit Summary: (MyChart) Due to this being a telephonic visit, the after visit summary with patients personalized plan was offered to patient via MyChart   Notes: Please refer to Routing Comments.

## 2023-09-03 ENCOUNTER — Encounter: Payer: Self-pay | Admitting: Internal Medicine

## 2023-09-07 ENCOUNTER — Encounter: Payer: Self-pay | Admitting: Internal Medicine

## 2023-09-07 ENCOUNTER — Ambulatory Visit (INDEPENDENT_AMBULATORY_CARE_PROVIDER_SITE_OTHER): Admitting: Internal Medicine

## 2023-09-07 VITALS — BP 122/64 | HR 87 | Ht 65.5 in | Wt 169.2 lb

## 2023-09-07 DIAGNOSIS — J432 Centrilobular emphysema: Secondary | ICD-10-CM | POA: Diagnosis not present

## 2023-09-07 DIAGNOSIS — R7303 Prediabetes: Secondary | ICD-10-CM

## 2023-09-07 DIAGNOSIS — R911 Solitary pulmonary nodule: Secondary | ICD-10-CM | POA: Diagnosis not present

## 2023-09-07 DIAGNOSIS — Z1231 Encounter for screening mammogram for malignant neoplasm of breast: Secondary | ICD-10-CM | POA: Diagnosis not present

## 2023-09-07 DIAGNOSIS — E782 Mixed hyperlipidemia: Secondary | ICD-10-CM | POA: Diagnosis not present

## 2023-09-07 DIAGNOSIS — I10 Essential (primary) hypertension: Secondary | ICD-10-CM

## 2023-09-07 DIAGNOSIS — Z0000 Encounter for general adult medical examination without abnormal findings: Secondary | ICD-10-CM

## 2023-09-07 MED ORDER — LOSARTAN POTASSIUM 50 MG PO TABS: 50.0000 mg | ORAL_TABLET | Freq: Every day | ORAL | 1 refills | Status: DC

## 2023-09-07 NOTE — Assessment & Plan Note (Signed)
 Mild symptoms not limiting.

## 2023-09-07 NOTE — Assessment & Plan Note (Signed)
 Continue annual CT scan to monitor - due December 2025

## 2023-09-07 NOTE — Assessment & Plan Note (Signed)
 Blood pressure is well controlled.  Current medications losartan Will continue same regimen along with efforts to limit dietary sodium.

## 2023-09-07 NOTE — Progress Notes (Signed)
 Date:  09/07/2023   Name:  Melinda Perry [facility]   DOB:  09-26-1945   MRN:  [id number]   Chief Complaint: Annual Exam Melinda Perry [place] is a 78 y.o. female who presents today for her Complete Annual Exam. She feels well. She reports exercising walks 1 mile everyday. She reports she is sleeping well. Breast complaints none.  Health Maintenance  Topic Date Due   COVID-19 Vaccine (12 - 2024-25 season) 06/06/2023   Mammogram  09/25/2023   Flu Shot  10/30/2023   Screening for Lung Cancer  03/11/2024   Medicare Annual Wellness Visit  09/01/2024   DEXA scan (bone density measurement)  09/18/2024   DTaP/Tdap/Td vaccine (4 - Td or Tdap) 08/29/2030   Pneumonia Vaccine  Completed   Zoster (Shingles) Vaccine  Completed   Hepatitis C Screening  Addressed   HPV Vaccine  Aged Out   Meningitis B Vaccine  Aged Out   Colon Cancer Screening  Discontinued    Hypertension This is a chronic problem. The problem is controlled. Pertinent negatives include no chest pain, headaches, palpitations or shortness of breath. Past treatments include angiotensin blockers and beta blockers.  Hyperlipidemia This is a chronic problem. The problem is controlled. Pertinent negatives include no chest pain, myalgias or shortness of breath. Current antihyperlipidemic treatment includes statins. The current treatment provides significant improvement of lipids.    Review of Systems  Constitutional:  Negative for fatigue and unexpected weight change.  HENT:  Negative for trouble swallowing.   Eyes:  Negative for visual disturbance.  Respiratory:  Negative for cough, chest tightness, shortness of breath and wheezing.   Cardiovascular:  Negative for chest pain, palpitations and leg swelling.  Gastrointestinal:  Negative for abdominal pain, constipation and diarrhea.  Musculoskeletal:  Positive for back pain (spinal stenosis). Negative for arthralgias and myalgias.  Neurological:  Negative for dizziness, weakness,  light-headedness and headaches.     Lab Results  Component Value Date   NA 140 09/03/2022   K 4.4 09/03/2022   CO2 23 09/03/2022   GLUCOSE 106 (H) 09/03/2022   BUN 14 09/03/2022   CREATININE 0.96 09/03/2022   CALCIUM  10.0 09/03/2022   EGFR 61 09/03/2022   GFRNONAA 65 02/08/2020   Lab Results  Component Value Date   CHOL 152 09/03/2022   HDL 49 09/03/2022   LDLCALC 81 09/03/2022   TRIG 122 09/03/2022   CHOLHDL 3.1 09/03/2022   Lab Results  Component Value Date   TSH 2.350 09/03/2022   Lab Results  Component Value Date   HGBA1C 5.4 09/03/2022   Lab Results  Component Value Date   WBC 5.0 09/03/2022   HGB 14.1 09/03/2022   HCT 42.5 09/03/2022   MCV 90 09/03/2022   PLT 191 09/03/2022   Lab Results  Component Value Date   ALT 19 09/03/2022   AST 22 09/03/2022   ALKPHOS 72 09/03/2022   BILITOT 0.5 09/03/2022   Lab Results  Component Value Date   VD25OH 36.4 08/28/2020     Patient Active Problem List   Diagnosis Date Noted   Lung nodule, solitary 03/05/2023   Lumbar radiculopathy 01/28/2023   Lumbosacral spondylosis without myelopathy 01/28/2023   Spinal stenosis of lumbar region 01/28/2023   Thoracic spondylosis 01/28/2023   Atherosclerosis of native coronary artery of native heart without angina pectoris 03/11/2021   Prediabetes 02/09/2020   Centrilobular emphysema (HCC) 10/27/2019   Aortic atherosclerosis (HCC) 03/08/2019   Osteopenia determined by x-ray 08/13/2017   Alopecia areata  07/23/2016   Heart palpitations 07/23/2016   Xerosis cutis 04/11/2015   Mixed hyperlipidemia 03/09/2015   Tobacco use disorder, moderate, in sustained remission 03/09/2015   History of vertebral compression fracture 03/09/2015   History of colon polyps 03/09/2015   Essential hypertension 03/09/2015   Microscopic hematuria 03/09/2015    Allergies  Allergen Reactions   Bupropion Other (See Comments)    Hair loss  bupropion    Past Surgical History:  Procedure  Laterality Date   AUGMENTATION MAMMAPLASTY Bilateral 1988   BREAST ENHANCEMENT SURGERY     BUNIONECTOMY Bilateral    CERVICAL CONE BIOPSY  1971   LSIL   EYE SURGERY  Lens replacement   facet shots in back  05/2016   [facility]-    TUBAL LIGATION      Social History   Tobacco Use   Smoking status: Former    Current packs/day: 0.00    Average packs/day: 0.8 packs/day for 60.0 years (45.0 ttl pk-yrs)    Types: Cigarettes    Start date: 12/20/1957    Quit date: 12/20/2017    Years since quitting: 5.7    Passive exposure: Past   Smokeless tobacco: Never  Vaping Use   Vaping status: Never Used  Substance Use Topics   Alcohol use: Yes    Comment: 1 drink monthly or less   Drug use: No     Medication list has been reviewed and updated.  Current Meds  Medication Sig   aspirin  81 MG EC tablet Take 1 tablet (81 mg total) by mouth daily. Swallow whole.   atorvastatin  (LIPITOR) 10 MG tablet TAKE 1 TABLET(10 MG) BY MOUTH DAILY AT 6 PM   Calcium  Citrate-Vitamin D3 (CALCIUM  CITRATE + D3) 315-6.25 MG-MCG TABS Take 1 tablet by mouth daily.   Fluocinolone  Acetonide 0.01 % OIL fluocinolone  acetonide oil 0.01 % ear drops  APPLY TO BOTH EARS TWICE DAILY AS NEEDED   metoprolol  succinate (TOPROL -XL) 25 MG 24 hr tablet Take 25 mg by mouth daily. Take 1/2 tablet daily   Multiple Vitamins-Minerals (MULTIVITAMIN WOMENS 50+ ADV PO) Take by mouth.   Omega-3 Fatty Acids (FISH OIL MINIS) 500 MG CAPS Take 1 tablet by mouth daily.   [DISCONTINUED] losartan  (COZAAR ) 50 MG tablet TAKE 1 TABLET(50 MG) BY MOUTH DAILY       09/07/2023   11:01 AM 03/05/2023    8:14 AM 09/03/2022    8:03 AM 03/04/2022    8:26 AM  GAD 7 : Generalized Anxiety Score  Nervous, Anxious, on Edge 0 0 0 0  Control/stop worrying 0 0 0 0  Worry too much - different things 0 0 0 0  Trouble relaxing 0 0 0 0  Restless 0 0 0 0  Easily annoyed or irritable 0 0 0 0  Afraid - awful might happen 0 0 0 0  Total GAD 7 Score 0 0 0 0   Anxiety Difficulty Not difficult at all Not difficult at all Not difficult at all Not difficult at all       09/07/2023   11:01 AM 09/02/2023   11:51 AM 03/05/2023    8:14 AM  Depression screen PHQ 2/9  Decreased Interest 0 0 0  Down, Depressed, Hopeless 0 0 0  PHQ - 2 Score 0 0 0  Altered sleeping 1 1 3   Tired, decreased energy 0 0 0  Change in appetite 0 0 1  Feeling bad or failure about yourself  0 0 0  Trouble concentrating 0  0 0  Moving slowly or fidgety/restless 0 0 0  Suicidal thoughts 0 0 0  PHQ-9 Score 1 1 4   Difficult doing work/chores Not difficult at all Not difficult at all Not difficult at all    BP Readings from Last 3 Encounters:  09/07/23 122/64  03/05/23 126/62  09/03/22 128/68    Physical Exam Vitals and nursing note reviewed.  Constitutional:      General: She is not in acute distress.    Appearance: She is well-developed.  HENT:     Head: Normocephalic and atraumatic.     Right Ear: Tympanic membrane and ear canal normal.     Left Ear: Tympanic membrane and ear canal normal.     Nose:     Right Sinus: No maxillary sinus tenderness.     Left Sinus: No maxillary sinus tenderness.  Eyes:     General: No scleral icterus.       Right eye: No discharge.        Left eye: No discharge.     Conjunctiva/sclera: Conjunctivae normal.  Neck:     Thyroid : No thyromegaly.     Vascular: No carotid bruit.  Cardiovascular:     Rate and Rhythm: Normal rate and regular rhythm.     Pulses: Normal pulses.     Heart sounds: Normal heart sounds.  Pulmonary:     Effort: Pulmonary effort is normal. No respiratory distress.     Breath sounds: No wheezing.  Abdominal:     General: Bowel sounds are normal.     Palpations: Abdomen is soft.     Tenderness: There is no abdominal tenderness.  Musculoskeletal:     Cervical back: Normal range of motion. No erythema.     Right lower leg: No edema.     Left lower leg: No edema.  Lymphadenopathy:     Cervical: No cervical  adenopathy.  Skin:    General: Skin is warm and dry.     Capillary Refill: Capillary refill takes less than 2 seconds.     Findings: No lesion or rash.  Neurological:     General: No focal deficit present.     Mental Status: She is alert and oriented to person, place, and time.     Cranial Nerves: No cranial nerve deficit.     Sensory: No sensory deficit.     Deep Tendon Reflexes: Reflexes are normal and symmetric.  Psychiatric:        Attention and Perception: Attention normal.        Mood and Affect: Mood normal.        Behavior: Behavior normal.     Wt Readings from Last 3 Encounters:  09/07/23 169 lb 4 oz (76.8 kg)  09/02/23 168 lb (76.2 kg)  03/05/23 176 lb (79.8 kg)    BP 122/64   Pulse 87   Ht 5' 5.5" (1.664 m)   Wt 169 lb 4 oz (76.8 kg)   SpO2 97%   BMI 27.74 kg/m   Assessment and Plan:  Problem List Items Addressed This Visit       Unprioritized   Mixed hyperlipidemia (Chronic)   LDL is  Lab Results  Component Value Date   LDLCALC 81 09/03/2022   Current regimen is atorvastatin  .  No medication side effects noted. Goal LDL is <70.       Relevant Medications   losartan  (COZAAR ) 50 MG tablet   Other Relevant Orders   Lipid panel   Essential hypertension (  Chronic)   Blood pressure is well controlled.  Current medications losartan  Will continue same regimen along with efforts to limit dietary sodium.       Relevant Medications   losartan  (COZAAR ) 50 MG tablet   Other Relevant Orders   CBC with Differential/Platelet   Comprehensive metabolic panel with GFR   TSH   Urinalysis, Routine w reflex microscopic   Centrilobular emphysema (HCC) (Chronic)   Mild symptoms not limiting.      Prediabetes (Chronic)   Managed with diet and exercise. Lab Results  Component Value Date   HGBA1C 5.4 09/03/2022          Relevant Orders   Hemoglobin A1c   Lung nodule, solitary   Continue annual CT scan to monitor - due December 2025      Other  Visit Diagnoses       Annual physical exam    -  Primary   continue healthy low carb diet, exercise mammogram scheduled aged out of colonoscopy     Encounter for screening mammogram for breast cancer       Scheduled       Return in about 5 months (around 02/07/2024) for HTN.    Sheron Dixons, MD [facility] [place]

## 2023-09-07 NOTE — Assessment & Plan Note (Signed)
 Managed with diet and exercise. Lab Results  Component Value Date   HGBA1C 5.4 09/03/2022

## 2023-09-07 NOTE — Assessment & Plan Note (Signed)
 LDL is  Lab Results  Component Value Date   LDLCALC 81 09/03/2022   Current regimen is atorvastatin  .  No medication side effects noted. Goal LDL is <70.

## 2023-09-08 ENCOUNTER — Ambulatory Visit: Payer: Self-pay | Admitting: Internal Medicine

## 2023-09-08 LAB — COMPREHENSIVE METABOLIC PANEL WITH GFR
ALT: 16 IU/L (ref 0–32)
AST: 20 IU/L (ref 0–40)
Albumin: 4.3 g/dL (ref 3.8–4.8)
Alkaline Phosphatase: 64 IU/L (ref 44–121)
BUN/Creatinine Ratio: 21 (ref 12–28)
BUN: 18 mg/dL (ref 8–27)
Bilirubin Total: 0.6 mg/dL (ref 0.0–1.2)
CO2: 18 mmol/L — ABNORMAL LOW (ref 20–29)
Calcium: 9.7 mg/dL (ref 8.7–10.3)
Chloride: 102 mmol/L (ref 96–106)
Creatinine, Ser: 0.86 mg/dL (ref 0.57–1.00)
Globulin, Total: 2.7 g/dL (ref 1.5–4.5)
Glucose: 92 mg/dL (ref 70–99)
Potassium: 4.7 mmol/L (ref 3.5–5.2)
Sodium: 137 mmol/L (ref 134–144)
Total Protein: 7 g/dL (ref 6.0–8.5)
eGFR: 69 mL/min/{1.73_m2} (ref >59)

## 2023-09-08 LAB — MICROSCOPIC EXAMINATION
Bacteria, UA: NONE SEEN
Casts: NONE SEEN /LPF

## 2023-09-08 LAB — CBC WITH DIFFERENTIAL/PLATELET
Basophils Absolute: 0 10*3/uL (ref 0.0–0.2)
Basos: 1 %
EOS (ABSOLUTE): 0.1 10*3/uL (ref 0.0–0.4)
Eos: 1 %
Hematocrit: 47.4 % — ABNORMAL HIGH (ref 34.0–46.6)
Hemoglobin: 14.8 g/dL (ref 11.1–15.9)
Immature Grans (Abs): 0 10*3/uL (ref 0.0–0.1)
Immature Granulocytes: 0 %
Lymphocytes Absolute: 1.4 10*3/uL (ref 0.7–3.1)
Lymphs: 25 %
MCH: 28.7 pg (ref 26.6–33.0)
MCHC: 31.2 g/dL — ABNORMAL LOW (ref 31.5–35.7)
MCV: 92 fL (ref 79–97)
Monocytes Absolute: 0.4 10*3/uL (ref 0.1–0.9)
Monocytes: 7 %
Neutrophils Absolute: 3.8 10*3/uL (ref 1.4–7.0)
Neutrophils: 66 %
Platelets: 190 10*3/uL (ref 150–450)
RBC: 5.16 x10E6/uL (ref 3.77–5.28)
RDW: 13 % (ref 11.7–15.4)
WBC: 5.7 10*3/uL (ref 3.4–10.8)

## 2023-09-08 LAB — URINALYSIS, ROUTINE W REFLEX MICROSCOPIC
Bilirubin, UA: NEGATIVE
Glucose, UA: NEGATIVE
Nitrite, UA: NEGATIVE
Specific Gravity, UA: 1.026 (ref 1.005–1.030)
Urobilinogen, Ur: 1 mg/dL (ref 0.2–1.0)
pH, UA: 6 (ref 5.0–7.5)

## 2023-09-08 LAB — LIPID PANEL
Chol/HDL Ratio: 2.8 ratio (ref 0.0–4.4)
Cholesterol, Total: 142 mg/dL (ref 100–199)
HDL: 50 mg/dL (ref >39)
LDL Chol Calc (NIH): 75 mg/dL (ref 0–99)
Triglycerides: 87 mg/dL (ref 0–149)
VLDL Cholesterol Cal: 17 mg/dL (ref 5–40)

## 2023-09-08 LAB — HEMOGLOBIN A1C
Est. average glucose Bld gHb Est-mCnc: 103 mg/dL
Hgb A1c MFr Bld: 5.2 % (ref 4.8–5.6)

## 2023-09-08 LAB — TSH: TSH: 2.3 u[IU]/mL (ref 0.450–4.500)

## 2023-10-05 ENCOUNTER — Ambulatory Visit
Admission: RE | Admit: 2023-10-05 | Discharge: 2023-10-05 | Disposition: A | Discharge status: Home / Self Care | Source: Ambulatory Visit | Attending: Internal Medicine | Admitting: Internal Medicine

## 2023-10-05 DIAGNOSIS — Z1231 Encounter for screening mammogram for malignant neoplasm of breast: Secondary | ICD-10-CM | POA: Diagnosis not present

## 2023-11-04 DIAGNOSIS — M1611 Unilateral primary osteoarthritis, right hip: Secondary | ICD-10-CM | POA: Diagnosis not present

## 2023-11-11 DIAGNOSIS — M25551 Pain in right hip: Secondary | ICD-10-CM | POA: Diagnosis not present

## 2023-11-13 ENCOUNTER — Other Ambulatory Visit: Payer: Self-pay | Admitting: Internal Medicine

## 2023-11-13 DIAGNOSIS — E782 Mixed hyperlipidemia: Secondary | ICD-10-CM

## 2023-11-16 NOTE — Telephone Encounter (Signed)
 Requested Prescriptions  Pending Prescriptions Disp Refills   atorvastatin  (LIPITOR) 10 MG tablet [Pharmacy Med Name: ATORVASTATIN  10MG  TABLETS] 90 tablet 3    Sig: TAKE 1 TABLET(10 MG) BY MOUTH DAILY AT 6 PM     Cardiovascular:  Antilipid - Statins Failed - 11/16/2023  4:12 PM      Failed - Lipid Panel in normal range within the last 12 months    Cholesterol, Total  Date Value Ref Range Status  09/07/2023 142 100 - 199 mg/dL Final   LDL Chol Calc (NIH)  Date Value Ref Range Status  09/07/2023 75 0 - 99 mg/dL Final   HDL  Date Value Ref Range Status  09/07/2023 50 >39 mg/dL Final   Triglycerides  Date Value Ref Range Status  09/07/2023 87 0 - 149 mg/dL Final         Passed - Patient is not pregnant      Passed - Valid encounter within last 12 months    Recent Outpatient Visits           2 months ago Annual physical exam   [facility] at [facility], Leita DEL, MD       Future Appointments             In 3 months Justus, Leita DEL, MD [facility] at [facility]

## 2023-11-25 DIAGNOSIS — M1611 Unilateral primary osteoarthritis, right hip: Secondary | ICD-10-CM | POA: Diagnosis not present

## 2023-12-07 DIAGNOSIS — H40053 Ocular hypertension, bilateral: Secondary | ICD-10-CM | POA: Diagnosis not present

## 2023-12-07 DIAGNOSIS — H53023 Refractive amblyopia, bilateral: Secondary | ICD-10-CM | POA: Diagnosis not present

## 2023-12-07 DIAGNOSIS — H35371 Puckering of macula, right eye: Secondary | ICD-10-CM | POA: Diagnosis not present

## 2023-12-07 DIAGNOSIS — H04123 Dry eye syndrome of bilateral lacrimal glands: Secondary | ICD-10-CM | POA: Diagnosis not present

## 2023-12-08 ENCOUNTER — Telehealth: Payer: Self-pay

## 2023-12-08 NOTE — Telephone Encounter (Signed)
 She has her last one in March of this year. She is not due until March 2026.

## 2023-12-08 NOTE — Telephone Encounter (Signed)
 Copied from CRM [id number]. Topic: General - Call Back - No Documentation >> Dec 08, 2023  9:02 AM Tonda B wrote: Reason for CRM: patient is calling asking about covid vaccine Please call pt [phone] (M)

## 2023-12-17 DIAGNOSIS — D485 Neoplasm of uncertain behavior of skin: Secondary | ICD-10-CM | POA: Diagnosis not present

## 2023-12-17 DIAGNOSIS — L821 Other seborrheic keratosis: Secondary | ICD-10-CM | POA: Diagnosis not present

## 2023-12-17 DIAGNOSIS — Z85828 Personal history of other malignant neoplasm of skin: Secondary | ICD-10-CM | POA: Diagnosis not present

## 2023-12-17 DIAGNOSIS — L304 Erythema intertrigo: Secondary | ICD-10-CM | POA: Diagnosis not present

## 2023-12-17 DIAGNOSIS — L578 Other skin changes due to chronic exposure to nonionizing radiation: Secondary | ICD-10-CM | POA: Diagnosis not present

## 2023-12-17 DIAGNOSIS — L814 Other melanin hyperpigmentation: Secondary | ICD-10-CM | POA: Diagnosis not present

## 2023-12-17 DIAGNOSIS — Z08 Encounter for follow-up examination after completed treatment for malignant neoplasm: Secondary | ICD-10-CM | POA: Diagnosis not present

## 2024-01-25 ENCOUNTER — Ambulatory Visit: Payer: Self-pay

## 2024-01-25 NOTE — Telephone Encounter (Signed)
 FYI Only or Action Required?: Action required by provider: update on patient condition.  Patient was last seen in primary care on 09/07/2023 by Justus Leita DEL, MD.  Called Nurse Triage reporting Heart Murmur.  Symptoms began today.  Interventions attempted: Nothing.  Symptoms are: stable.  Triage Disposition: Home Care  Patient/caregiver understands and will follow disposition?: Yes  Copied from CRM [id number]. Topic: Clinical - Red Word Triage >> Jan 25, 2024  1:41 PM Nathanel BROCKS wrote: Red Word that prompted transfer to Nurse Triage: Carly with [employer], [phone] went to pts house today and stated that pt has a heart murmer. Pt did not show any symptoms or signs of anythng going on but Carly wanted to follow up with her dr. [place] for Disposition  [1] Skipped or extra beat(s) AND [2] occurs < 4 times / minute  Answer Assessment - Initial Assessment Questions Additional info: Carley with [employer] calling to report for documentation purposes that she noted a cardiac murmur on Pat today. She is asymptomatic at this time.  Followed by cardiology-she has been evaluated and diagnosed with PVC's  1. DESCRIPTION: Please describe your heart rate or heartbeat that you are having (e.g., fast/slow, regular/irregular, skipped or extra beats, palpitations)     Not feeling anything unusual.  2. ONSET: When did it start? (e.g., minutes, hours, days)      Carly with [employer]-reporting murmur  3. DURATION: How long does it last (e.g., seconds, minutes, hours)     N/a 4. PATTERN Does it come and go, or has it been constant since it started?  Does it get worse with exertion?   Are you feeling it now?     No symptoms  5. TAP: Using your hand, can you tap out what you are feeling on a chair or table in front of you, so that I can hear? Note: Not all patients can do this.        6. HEART RATE: Can you tell me your heart rate? How many beats in 15 seconds?  Note: Not all  patients can do this.       Todays vitals by Carly: 160/80, 150/66 hr 64 7. RECURRENT SYMPTOM: Have you ever had this before? If Yes, ask: When was the last time? and What happened that time?      Followed by cardiology  8. CAUSE: What do you think is causing the palpitations?     na 9. CARDIAC HISTORY: Do you have any history of heart disease? (e.g., heart attack, angina, bypass surgery, angioplasty, arrhythmia)      htn 10. OTHER SYMPTOMS: Do you have any other symptoms? (e.g., dizziness, chest pain, sweating, difficulty breathing)       Denies all symptoms  Protocols used: Heart Rate and Heartbeat Questions-A-AH

## 2024-01-25 NOTE — Telephone Encounter (Signed)
 Called pt and left VM. Unable to reach at this time. Please inform pt if she calls back that she needs to follow up with Cardiology for new possible heart murmer.

## 2024-01-25 NOTE — Telephone Encounter (Signed)
 Melinda Perry with [employer], [phone] went to pts house today and stated that pt has a heart murmer. Pt did not show any symptoms or signs of anythng going on but Melinda Perry wanted to follow up with her dr.   DANEIL

## 2024-02-04 ENCOUNTER — Other Ambulatory Visit: Payer: Self-pay | Admitting: Internal Medicine

## 2024-02-04 DIAGNOSIS — I10 Essential (primary) hypertension: Secondary | ICD-10-CM

## 2024-02-05 ENCOUNTER — Telehealth: Payer: Self-pay | Admitting: Internal Medicine

## 2024-02-05 ENCOUNTER — Other Ambulatory Visit: Payer: Self-pay

## 2024-02-05 DIAGNOSIS — I10 Essential (primary) hypertension: Secondary | ICD-10-CM

## 2024-02-05 MED ORDER — LOSARTAN POTASSIUM 50 MG PO TABS: 50.0000 mg | ORAL_TABLET | Freq: Every day | ORAL | 0 refills | Status: AC

## 2024-02-05 NOTE — Telephone Encounter (Signed)
 Spoke with pt. Refilled.

## 2024-02-05 NOTE — Telephone Encounter (Signed)
 Rx 09/07/23 #90 1RF- 6 month supply Requested Prescriptions  Pending Prescriptions Disp Refills   losartan  (COZAAR ) 50 MG tablet [Pharmacy Med Name: LOSARTAN  50MG  TABLETS] 90 tablet 1    Sig: TAKE 1 TABLET(50 MG) BY MOUTH DAILY     Cardiovascular:  Angiotensin Receptor Blockers Passed - 02/05/2024  2:10 PM      Passed - Cr in normal range and within 180 days    Creatinine, Ser  Date Value Ref Range Status  09/07/2023 0.86 0.57 - 1.00 mg/dL Final         Passed - K in normal range and within 180 days    Potassium  Date Value Ref Range Status  09/07/2023 4.7 3.5 - 5.2 mmol/L Final         Passed - Patient is not pregnant      Passed - Last BP in normal range    BP Readings from Last 1 Encounters:  09/07/23 122/64         Passed - Valid encounter within last 6 months    Recent Outpatient Visits           5 months ago Annual physical exam   [facility] at [facility], Leita DEL, MD       Future Appointments             In 2 weeks Justus, Leita DEL, MD [facility] at [facility], [phone] [place]

## 2024-02-05 NOTE — Telephone Encounter (Signed)
 Copied from CRM #[id number]. Topic: Clinical - Prescription Issue >> Feb 05, 2024  2:18 PM Hadassah PARAS wrote: Reason for CRM: Pt's losartan  (COZAAR ) 50 MG tablet has been refused. Please advise #[phone]

## 2024-02-24 ENCOUNTER — Encounter: Payer: Self-pay | Admitting: Internal Medicine

## 2024-02-24 ENCOUNTER — Telehealth: Payer: Self-pay

## 2024-02-24 ENCOUNTER — Ambulatory Visit (INDEPENDENT_AMBULATORY_CARE_PROVIDER_SITE_OTHER): Admitting: Internal Medicine

## 2024-02-24 ENCOUNTER — Other Ambulatory Visit: Payer: Self-pay | Admitting: Internal Medicine

## 2024-02-24 VITALS — BP 136/64 | HR 77 | Ht 65.5 in | Wt 164.0 lb

## 2024-02-24 DIAGNOSIS — I10 Essential (primary) hypertension: Secondary | ICD-10-CM

## 2024-02-24 DIAGNOSIS — F17201 Nicotine dependence, unspecified, in remission: Secondary | ICD-10-CM

## 2024-02-24 NOTE — Progress Notes (Signed)
 Date:  02/24/2024   Name:  Melinda Perry   DOB:  Apr 22, 1945   MRN:  [id number]   Chief Complaint: Hypertension and Alopecia (Went to derm. They would not give her monoxidil because of her BP until Melinda Perry gets the ok from PCP)  Hypertension This is a chronic problem. The problem is controlled. Associated symptoms include palpitations. Pertinent negatives include no chest pain, headaches or shortness of breath. Past treatments include angiotensin blockers. The current treatment provides significant improvement. There is no history of kidney disease, CAD/MI or CVA.  Melinda Perry wants to take Minoxidil for alopecia but needs a letter for her dermatologist.  Review of Systems  Constitutional:  Negative for fatigue and unexpected weight change.  HENT:  Negative for trouble swallowing.   Eyes:  Negative for visual disturbance.  Respiratory:  Negative for cough, chest tightness, shortness of breath and wheezing.   Cardiovascular:  Positive for palpitations. Negative for chest pain and leg swelling.  Gastrointestinal:  Negative for abdominal pain, constipation and diarrhea.  Musculoskeletal:  Negative for arthralgias and myalgias.  Neurological:  Negative for dizziness, weakness, light-headedness and headaches.  Psychiatric/Behavioral:  Negative for dysphoric mood and sleep disturbance. The patient is not nervous/anxious.      Lab Results  Component Value Date   NA 137 09/07/2023   K 4.7 09/07/2023   CO2 18 (L) 09/07/2023   GLUCOSE 92 09/07/2023   BUN 18 09/07/2023   CREATININE 0.86 09/07/2023   CALCIUM  9.7 09/07/2023   EGFR 69 09/07/2023   GFRNONAA 65 02/08/2020   Lab Results  Component Value Date   CHOL 142 09/07/2023   HDL 50 09/07/2023   LDLCALC 75 09/07/2023   TRIG 87 09/07/2023   CHOLHDL 2.8 09/07/2023   Lab Results  Component Value Date   TSH 2.300 09/07/2023   Lab Results  Component Value Date   HGBA1C 5.2 09/07/2023   Lab Results  Component Value Date   WBC 5.7  09/07/2023   HGB 14.8 09/07/2023   HCT 47.4 (H) 09/07/2023   MCV 92 09/07/2023   PLT 190 09/07/2023   Lab Results  Component Value Date   ALT 16 09/07/2023   AST 20 09/07/2023   ALKPHOS 64 09/07/2023   BILITOT 0.6 09/07/2023   Lab Results  Component Value Date   VD25OH 36.4 08/28/2020     Patient Active Problem List   Diagnosis Date Noted   Lung nodule, solitary 03/05/2023   Lumbar radiculopathy 01/28/2023   Lumbosacral spondylosis without myelopathy 01/28/2023   Spinal stenosis of lumbar region 01/28/2023   Thoracic spondylosis 01/28/2023   Atherosclerosis of native coronary artery of native heart without angina pectoris 03/11/2021   Centrilobular emphysema (HCC) 10/27/2019   Aortic atherosclerosis 03/08/2019   Osteopenia determined by x-ray 08/13/2017   Alopecia areata 07/23/2016   Heart palpitations 07/23/2016   Xerosis cutis 04/11/2015   Mixed hyperlipidemia 03/09/2015   Tobacco use disorder, moderate, in sustained remission 03/09/2015   History of vertebral compression fracture 03/09/2015   History of colon polyps 03/09/2015   Essential hypertension 03/09/2015   Microscopic hematuria 03/09/2015    Allergies  Allergen Reactions   Bupropion Other (See Comments)    Hair loss  bupropion    Past Surgical History:  Procedure Laterality Date   AUGMENTATION MAMMAPLASTY Bilateral 1988   BREAST ENHANCEMENT SURGERY     BUNIONECTOMY Bilateral    CERVICAL CONE BIOPSY  1971   LSIL   EYE SURGERY  Lens replacement  facet shots in back  05/2016   [facility]-    TUBAL LIGATION      Social History   Tobacco Use   Smoking status: Former    Current packs/day: 0.00    Average packs/day: 0.8 packs/day for 60.0 years (45.0 ttl pk-yrs)    Types: Cigarettes    Start date: 12/20/1957    Quit date: 12/20/2017    Years since quitting: 6.1    Passive exposure: Past   Smokeless tobacco: Never  Vaping Use   Vaping status: Never Used  Substance Use Topics   Alcohol  use: Yes    Comment: 1 drink monthly or less   Drug use: No     Medication list has been reviewed and updated.  Current Meds  Medication Sig   aspirin  81 MG EC tablet Take 1 tablet (81 mg total) by mouth daily. Swallow whole.   atorvastatin  (LIPITOR) 10 MG tablet TAKE 1 TABLET(10 MG) BY MOUTH DAILY AT 6 PM   Calcium  Citrate-Vitamin D3 (CALCIUM  CITRATE + D3) 315-6.25 MG-MCG TABS Take 1 tablet by mouth daily.   Fluocinolone  Acetonide 0.01 % OIL fluocinolone  acetonide oil 0.01 % ear drops  APPLY TO BOTH EARS TWICE DAILY AS NEEDED   fluorometholone (FML) 0.1 % ophthalmic suspension 1 drop every other day.   losartan  (COZAAR ) 50 MG tablet Take 1 tablet (50 mg total) by mouth daily.   metoprolol  succinate (TOPROL -XL) 25 MG 24 hr tablet Take 25 mg by mouth daily. Take 1/2 tablet daily (Patient taking differently: Take 12.5 mg by mouth daily. Take 1/2 tablet daily)   Multiple Vitamins-Minerals (MULTIVITAMIN WOMENS 50+ ADV PO) Take by mouth.   Omega-3 Fatty Acids (FISH OIL MINIS) 500 MG CAPS Take 1 tablet by mouth daily.   [DISCONTINUED] meloxicam (MOBIC) 15 MG tablet Take 15 mg by mouth every other day.       02/24/2024    9:45 AM 09/07/2023   11:01 AM 03/05/2023    8:14 AM 09/03/2022    8:03 AM  GAD 7 : Generalized Anxiety Score  Nervous, Anxious, on Edge 0 0 0 0  Control/stop worrying 0 0 0 0  Worry too much - different things 0 0 0 0  Trouble relaxing 0 0 0 0  Restless 0 0 0 0  Easily annoyed or irritable 0 0 0 0  Afraid - awful might happen 0 0 0 0  Total GAD 7 Score 0 0 0 0  Anxiety Difficulty Not difficult at all Not difficult at all Not difficult at all Not difficult at all       02/24/2024    9:45 AM 09/07/2023   11:01 AM 09/02/2023   11:51 AM  Depression screen PHQ 2/9  Decreased Interest 0 0 0  Down, Depressed, Hopeless 0 0 0  PHQ - 2 Score 0 0 0  Altered sleeping  1 1  Tired, decreased energy  0 0  Change in appetite  0 0  Feeling bad or failure about yourself   0 0   Trouble concentrating  0 0  Moving slowly or fidgety/restless  0 0  Suicidal thoughts  0 0  PHQ-9 Score  1  1   Difficult doing work/chores  Not difficult at all Not difficult at all     Data saved with a previous flowsheet row definition    BP Readings from Last 3 Encounters:  02/24/24 136/64  09/07/23 122/64  03/05/23 126/62    Physical Exam Vitals and nursing  note reviewed.  Constitutional:      General: Melinda Perry is not in acute distress.    Appearance: Normal appearance. Melinda Perry is well-developed.  HENT:     Head: Normocephalic and atraumatic.  Neck:     Vascular: No carotid bruit.  Cardiovascular:     Rate and Rhythm: Normal rate and regular rhythm. Frequent Extrasystoles are present.    Heart sounds: No murmur heard. Pulmonary:     Effort: Pulmonary effort is normal. No respiratory distress.     Breath sounds: No wheezing or rhonchi.  Musculoskeletal:     Cervical back: Normal range of motion.     Right lower leg: No edema.     Left lower leg: No edema.  Lymphadenopathy:     Cervical: No cervical adenopathy.  Skin:    General: Skin is warm and dry.     Findings: No rash.  Neurological:     Mental Status: Melinda Perry is alert and oriented to person, place, and time.  Psychiatric:        Mood and Affect: Mood normal.        Behavior: Behavior normal.     Wt Readings from Last 3 Encounters:  02/24/24 164 lb (74.4 kg)  09/07/23 169 lb 4 oz (76.8 kg)  09/02/23 168 lb (76.2 kg)    BP 136/64   Pulse 77   Ht 5' 5.5 (1.664 m)   Wt 164 lb (74.4 kg)   SpO2 97%   BMI 26.88 kg/m   Assessment and Plan:  Problem List Items Addressed This Visit       Unprioritized   Essential hypertension - Primary (Chronic)   Well controlled blood pressure today. Current regimen is metoprolol  and losartan . No medication side effects noted. Letter written re: Minoxidil          No follow-ups on file.    Leita HILARIO Adie, MD [facility]  [place]

## 2024-02-24 NOTE — Assessment & Plan Note (Addendum)
 Well controlled blood pressure today. Current regimen is metoprolol  and losartan . No medication side effects noted. Letter written re: Minoxidil

## 2024-02-24 NOTE — Telephone Encounter (Signed)
 Please review and advise.  JM

## 2024-02-24 NOTE — Telephone Encounter (Signed)
 Copied from CRM #[id number]. Topic: Clinical - Request for Lab/Test Order >> Feb 24, 2024 11:22 AM Deaijah H wrote: Reason for CRM: Patient stated she wants a Low dose lung scan completed.

## 2024-02-24 NOTE — Progress Notes (Unsigned)
 Date:  02/24/2024   Name:  Melinda Perry [facility]   DOB:  05-26-1945   MRN:  [id number]   Chief Complaint: No chief complaint on file.  HPI  Review of Systems   Lab Results  Component Value Date   NA 137 09/07/2023   K 4.7 09/07/2023   CO2 18 (L) 09/07/2023   GLUCOSE 92 09/07/2023   BUN 18 09/07/2023   CREATININE 0.86 09/07/2023   CALCIUM  9.7 09/07/2023   EGFR 69 09/07/2023   GFRNONAA 65 02/08/2020   Lab Results  Component Value Date   CHOL 142 09/07/2023   HDL 50 09/07/2023   LDLCALC 75 09/07/2023   TRIG 87 09/07/2023   CHOLHDL 2.8 09/07/2023   Lab Results  Component Value Date   TSH 2.300 09/07/2023   Lab Results  Component Value Date   HGBA1C 5.2 09/07/2023   Lab Results  Component Value Date   WBC 5.7 09/07/2023   HGB 14.8 09/07/2023   HCT 47.4 (H) 09/07/2023   MCV 92 09/07/2023   PLT 190 09/07/2023   Lab Results  Component Value Date   ALT 16 09/07/2023   AST 20 09/07/2023   ALKPHOS 64 09/07/2023   BILITOT 0.6 09/07/2023   Lab Results  Component Value Date   VD25OH 36.4 08/28/2020     Patient Active Problem List   Diagnosis Date Noted   Lung nodule, solitary 03/05/2023   Lumbar radiculopathy 01/28/2023   Lumbosacral spondylosis without myelopathy 01/28/2023   Spinal stenosis of lumbar region 01/28/2023   Thoracic spondylosis 01/28/2023   Atherosclerosis of native coronary artery of native heart without angina pectoris 03/11/2021   Centrilobular emphysema (HCC) 10/27/2019   Aortic atherosclerosis 03/08/2019   Osteopenia determined by x-ray 08/13/2017   Alopecia areata 07/23/2016   Heart palpitations 07/23/2016   Xerosis cutis 04/11/2015   Mixed hyperlipidemia 03/09/2015   Tobacco use disorder, moderate, in sustained remission 03/09/2015   History of vertebral compression fracture 03/09/2015   History of colon polyps 03/09/2015   Essential hypertension 03/09/2015   Microscopic hematuria 03/09/2015    Allergies  Allergen Reactions    Bupropion Other (See Comments)    Hair loss  bupropion    Past Surgical History:  Procedure Laterality Date   AUGMENTATION MAMMAPLASTY Bilateral 1988   BREAST ENHANCEMENT SURGERY     BUNIONECTOMY Bilateral    CERVICAL CONE BIOPSY  1971   LSIL   EYE SURGERY  Lens replacement   facet shots in back  05/2016   [facility]-    TUBAL LIGATION      Social History   Tobacco Use   Smoking status: Former    Current packs/day: 0.00    Average packs/day: 0.8 packs/day for 60.0 years (45.0 ttl pk-yrs)    Types: Cigarettes    Start date: 12/20/1957    Quit date: 12/20/2017    Years since quitting: 6.1    Passive exposure: Past   Smokeless tobacco: Never  Vaping Use   Vaping status: Never Used  Substance Use Topics   Alcohol use: Yes    Comment: 1 drink monthly or less   Drug use: No     Medication list has been reviewed and updated.  No outpatient medications have been marked as taking for the 02/24/24 encounter (Orders Only) with Justus Leita DEL, MD.       02/24/2024    9:45 AM 09/07/2023   11:01 AM 03/05/2023    8:14 AM 09/03/2022    8:03  AM  GAD 7 : Generalized Anxiety Score  Nervous, Anxious, on Edge 0 0 0 0  Control/stop worrying 0 0 0 0  Worry too much - different things 0 0 0 0  Trouble relaxing 0 0 0 0  Restless 0 0 0 0  Easily annoyed or irritable 0 0 0 0  Afraid - awful might happen 0 0 0 0  Total GAD 7 Score 0 0 0 0  Anxiety Difficulty Not difficult at all Not difficult at all Not difficult at all Not difficult at all       02/24/2024    9:45 AM 09/07/2023   11:01 AM 09/02/2023   11:51 AM  Depression screen PHQ 2/9  Decreased Interest 0 0 0  Down, Depressed, Hopeless 0 0 0  PHQ - 2 Score 0 0 0  Altered sleeping  1 1  Tired, decreased energy  0 0  Change in appetite  0 0  Feeling bad or failure about yourself   0 0  Trouble concentrating  0 0  Moving slowly or fidgety/restless  0 0  Suicidal thoughts  0 0  PHQ-9 Score  1  1   Difficult doing  work/chores  Not difficult at all Not difficult at all     Data saved with a previous flowsheet row definition    BP Readings from Last 3 Encounters:  02/24/24 136/64  09/07/23 122/64  03/05/23 126/62    Physical Exam  Wt Readings from Last 3 Encounters:  02/24/24 164 lb (74.4 kg)  09/07/23 169 lb 4 oz (76.8 kg)  09/02/23 168 lb (76.2 kg)    There were no vitals taken for this visit.  Assessment and Plan:  Problem List Items Addressed This Visit   None   No follow-ups on file.    Leita HILARIO Adie, MD [facility] [place]

## 2024-02-29 ENCOUNTER — Other Ambulatory Visit: Payer: Self-pay | Admitting: Internal Medicine

## 2024-02-29 ENCOUNTER — Encounter: Payer: Self-pay | Admitting: Internal Medicine

## 2024-02-29 ENCOUNTER — Telehealth: Payer: Self-pay | Admitting: *Deleted

## 2024-02-29 DIAGNOSIS — R918 Other nonspecific abnormal finding of lung field: Secondary | ICD-10-CM

## 2024-02-29 NOTE — Progress Notes (Unsigned)
 Date:  02/29/2024   Name:  Melinda Perry [facility]   DOB:  08/16/1945   MRN:  [id number]   Chief Complaint: No chief complaint on file.  HPI  Review of Systems   Lab Results  Component Value Date   NA 137 09/07/2023   K 4.7 09/07/2023   CO2 18 (L) 09/07/2023   GLUCOSE 92 09/07/2023   BUN 18 09/07/2023   CREATININE 0.86 09/07/2023   CALCIUM  9.7 09/07/2023   EGFR 69 09/07/2023   GFRNONAA 65 02/08/2020   Lab Results  Component Value Date   CHOL 142 09/07/2023   HDL 50 09/07/2023   LDLCALC 75 09/07/2023   TRIG 87 09/07/2023   CHOLHDL 2.8 09/07/2023   Lab Results  Component Value Date   TSH 2.300 09/07/2023   Lab Results  Component Value Date   HGBA1C 5.2 09/07/2023   Lab Results  Component Value Date   WBC 5.7 09/07/2023   HGB 14.8 09/07/2023   HCT 47.4 (H) 09/07/2023   MCV 92 09/07/2023   PLT 190 09/07/2023   Lab Results  Component Value Date   ALT 16 09/07/2023   AST 20 09/07/2023   ALKPHOS 64 09/07/2023   BILITOT 0.6 09/07/2023   Lab Results  Component Value Date   VD25OH 36.4 08/28/2020     Patient Active Problem List   Diagnosis Date Noted   Pulmonary nodules 02/29/2024   Lung nodule, solitary 03/05/2023   Lumbar radiculopathy 01/28/2023   Lumbosacral spondylosis without myelopathy 01/28/2023   Spinal stenosis of lumbar region 01/28/2023   Thoracic spondylosis 01/28/2023   Atherosclerosis of native coronary artery of native heart without angina pectoris 03/11/2021   Centrilobular emphysema (HCC) 10/27/2019   Aortic atherosclerosis 03/08/2019   Osteopenia determined by x-ray 08/13/2017   Alopecia areata 07/23/2016   Heart palpitations 07/23/2016   Xerosis cutis 04/11/2015   Mixed hyperlipidemia 03/09/2015   Tobacco use disorder, moderate, in sustained remission 03/09/2015   History of vertebral compression fracture 03/09/2015   History of colon polyps 03/09/2015   Essential hypertension 03/09/2015   Microscopic hematuria 03/09/2015     Allergies  Allergen Reactions   Bupropion Other (See Comments)    Hair loss  bupropion    Past Surgical History:  Procedure Laterality Date   AUGMENTATION MAMMAPLASTY Bilateral 1988   BREAST ENHANCEMENT SURGERY     BUNIONECTOMY Bilateral    CERVICAL CONE BIOPSY  1971   LSIL   EYE SURGERY  Lens replacement   facet shots in back  05/2016   [facility]-    TUBAL LIGATION      Social History   Tobacco Use   Smoking status: Former    Current packs/day: 0.00    Average packs/day: 0.8 packs/day for 60.0 years (45.0 ttl pk-yrs)    Types: Cigarettes    Start date: 12/20/1957    Quit date: 12/20/2017    Years since quitting: 6.1    Passive exposure: Past   Smokeless tobacco: Never  Vaping Use   Vaping status: Never Used  Substance Use Topics   Alcohol use: Yes    Comment: 1 drink monthly or less   Drug use: No     Medication list has been reviewed and updated.  No outpatient medications have been marked as taking for the 02/29/24 encounter (Orders Only) with Justus Leita DEL, MD.       02/24/2024    9:45 AM 09/07/2023   11:01 AM 03/05/2023    8:14 AM  09/03/2022    8:03 AM  GAD 7 : Generalized Anxiety Score  Nervous, Anxious, on Edge 0 0 0 0  Control/stop worrying 0 0 0 0  Worry too much - different things 0 0 0 0  Trouble relaxing 0 0 0 0  Restless 0 0 0 0  Easily annoyed or irritable 0 0 0 0  Afraid - awful might happen 0 0 0 0  Total GAD 7 Score 0 0 0 0  Anxiety Difficulty Not difficult at all Not difficult at all Not difficult at all Not difficult at all       02/24/2024    9:45 AM 09/07/2023   11:01 AM 09/02/2023   11:51 AM  Depression screen PHQ 2/9  Decreased Interest 0 0 0  Down, Depressed, Hopeless 0 0 0  PHQ - 2 Score 0 0 0  Altered sleeping  1 1  Tired, decreased energy  0 0  Change in appetite  0 0  Feeling bad or failure about yourself   0 0  Trouble concentrating  0 0  Moving slowly or fidgety/restless  0 0  Suicidal thoughts  0 0  PHQ-9  Score  1  1   Difficult doing work/chores  Not difficult at all Not difficult at all     Data saved with a previous flowsheet row definition    BP Readings from Last 3 Encounters:  02/24/24 136/64  09/07/23 122/64  03/05/23 126/62    Physical Exam  Wt Readings from Last 3 Encounters:  02/24/24 164 lb (74.4 kg)  09/07/23 169 lb 4 oz (76.8 kg)  09/02/23 168 lb (76.2 kg)    There were no vitals taken for this visit.  Assessment and Plan:  Problem List Items Addressed This Visit   None   No follow-ups on file.    Leita HILARIO Adie, MD [facility] [place]

## 2024-02-29 NOTE — Telephone Encounter (Signed)
 Patient called in and had questions regarding CMS age guidelines for lung cancer screening. I advised pt that currently the CMS age guidelines for lung cancer screening are for ages 22-77. Pt is concerned about a small lung nodule that was seen on her past lung screening CT scan. I advised patient that I would send a message to Dr Justus to see if she would be open to ordering a chest CT without for patient. Please advise patient on your thoughts.

## 2024-03-02 ENCOUNTER — Ambulatory Visit

## 2024-03-10 ENCOUNTER — Ambulatory Visit: Admission: RE | Admit: 2024-03-10 | Discharge: 2024-03-10 | Attending: Internal Medicine | Admitting: Internal Medicine

## 2024-03-10 DIAGNOSIS — R918 Other nonspecific abnormal finding of lung field: Secondary | ICD-10-CM | POA: Insufficient documentation

## 2024-03-15 ENCOUNTER — Telehealth: Payer: Self-pay

## 2024-03-15 NOTE — Telephone Encounter (Signed)
 Copied from CRM #[id number]. Topic: Clinical - Lab/Test Results >> Mar 15, 2024 10:11 AM Melinda Perry wrote: Reason for CRM: Patient called in regarding her lung screening results, would like a callback

## 2024-03-15 NOTE — Telephone Encounter (Signed)
 Called pt left VM to call back. We dont have the results yet.  KP

## 2024-03-18 ENCOUNTER — Telehealth: Payer: Self-pay

## 2024-03-18 NOTE — Telephone Encounter (Signed)
 Called pt let her know the results are not in yet. Pt verbalized understanding.  KP

## 2024-03-18 NOTE — Telephone Encounter (Signed)
 Copied from CRM #[id number]. Topic: Clinical - Lab/Test Results >> Mar 15, 2024 10:11 AM Melinda Perry wrote: Reason for CRM: Patient called in regarding her lung screening results, would like a callback >> Mar 18, 2024 10:01 AM [name] wrote: Pt is calling in regards to her lung scan and would like answers of the scan as she has been calling and everyone keeps telling her its not resulted and she is asking that some one call her back today.  >> Mar 15, 2024  4:25 PM [name] wrote: Pt returned office call and is aware the results have not be received yet. Office will call her back when they are avail

## 2024-03-18 NOTE — Telephone Encounter (Signed)
 Patient called to follow up on CT results. Explained to patient we do not have report from radiology yet. Also advised she may see report on MyChart before Dr. Justus has a chance to review it, especially if it comes in over the weekend. She voiced understanding, is just frustrated with delay from radiology. Nothing further needed at this time.

## 2024-03-21 ENCOUNTER — Ambulatory Visit: Payer: Self-pay | Admitting: Internal Medicine

## 2024-09-07 ENCOUNTER — Ambulatory Visit

## 2024-09-08 ENCOUNTER — Encounter: Admitting: Student
